# Patient Record
Sex: Female | Born: 1990
Health system: Southern US, Community
[De-identification: ages and names within clinical notes are randomized; demographics above are authoritative.]

## PROBLEM LIST (undated history)

## (undated) DIAGNOSIS — R269 Unspecified abnormalities of gait and mobility: Secondary | ICD-10-CM

## (undated) DIAGNOSIS — F419 Anxiety disorder, unspecified: Secondary | ICD-10-CM

## (undated) DIAGNOSIS — O0990 Supervision of high risk pregnancy, unspecified, unspecified trimester: Secondary | ICD-10-CM

## (undated) DIAGNOSIS — E7800 Pure hypercholesterolemia, unspecified: Secondary | ICD-10-CM

## (undated) DIAGNOSIS — F329 Major depressive disorder, single episode, unspecified: Secondary | ICD-10-CM

## (undated) DIAGNOSIS — R740 Nonspecific elevation of levels of transaminase and lactic acid dehydrogenase [LDH]: Secondary | ICD-10-CM

## (undated) DIAGNOSIS — G43019 Migraine without aura, intractable, without status migrainosus: Secondary | ICD-10-CM

## (undated) DIAGNOSIS — E559 Vitamin D deficiency, unspecified: Secondary | ICD-10-CM

## (undated) DIAGNOSIS — O403XX0 Polyhydramnios, third trimester, not applicable or unspecified: Secondary | ICD-10-CM

## (undated) DIAGNOSIS — O09293 Supervision of pregnancy with other poor reproductive or obstetric history, third trimester: Secondary | ICD-10-CM

## (undated) DIAGNOSIS — O24419 Gestational diabetes mellitus in pregnancy, unspecified control: Secondary | ICD-10-CM

## (undated) DIAGNOSIS — Z8759 Personal history of other complications of pregnancy, childbirth and the puerperium: Secondary | ICD-10-CM

## (undated) DIAGNOSIS — O1490 Unspecified pre-eclampsia, unspecified trimester: Secondary | ICD-10-CM

## (undated) DIAGNOSIS — R7401 Elevation of levels of liver transaminase levels: Secondary | ICD-10-CM

## (undated) DIAGNOSIS — F319 Bipolar disorder, unspecified: Secondary | ICD-10-CM

## (undated) DIAGNOSIS — F32A Depression, unspecified: Secondary | ICD-10-CM

## (undated) HISTORY — DX: Unspecified abnormalities of gait and mobility: R26.9

## (undated) HISTORY — DX: Polyhydramnios, third trimester, not applicable or unspecified: O40.3XX0

## (undated) HISTORY — DX: Supervision of high risk pregnancy, unspecified, unspecified trimester: O09.90

## (undated) HISTORY — DX: Nonspecific elevation of levels of transaminase and lactic acid dehydrogenase (ldh): R74.0

## (undated) HISTORY — PX: TUBAL LIGATION: SHX77

## (undated) HISTORY — DX: Depression, unspecified: F32.A

## (undated) HISTORY — DX: Personal history of other complications of pregnancy, childbirth and the puerperium: Z87.59

## (undated) HISTORY — DX: Migraine without aura, intractable, without status migrainosus: G43.019

## (undated) HISTORY — DX: Vitamin D deficiency, unspecified: E55.9

## (undated) HISTORY — DX: Anxiety disorder, unspecified: F41.9

## (undated) HISTORY — DX: Supervision of pregnancy with other poor reproductive or obstetric history, third trimester: O09.293

## (undated) HISTORY — DX: Elevation of levels of liver transaminase levels: R74.01

## (undated) HISTORY — DX: Bipolar disorder, unspecified: F31.9

---
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
## 2008-02-24 ENCOUNTER — Ambulatory Visit: Payer: Self-pay | Admitting: Family Medicine

## 2008-04-30 ENCOUNTER — Observation Stay: Payer: Self-pay

## 2008-06-24 ENCOUNTER — Inpatient Hospital Stay: Payer: Self-pay

## 2009-06-06 ENCOUNTER — Ambulatory Visit: Payer: Self-pay | Admitting: Family Medicine

## 2009-09-07 ENCOUNTER — Encounter: Payer: Self-pay | Admitting: Maternal & Fetal Medicine

## 2009-09-12 ENCOUNTER — Encounter: Payer: Self-pay | Admitting: Pediatric Cardiology

## 2009-10-08 ENCOUNTER — Observation Stay: Payer: Self-pay | Admitting: Obstetrics & Gynecology

## 2009-10-12 ENCOUNTER — Encounter: Payer: Self-pay | Admitting: Obstetrics & Gynecology

## 2009-11-07 ENCOUNTER — Observation Stay: Payer: Self-pay

## 2009-11-08 ENCOUNTER — Ambulatory Visit: Payer: Self-pay

## 2009-11-09 ENCOUNTER — Encounter: Payer: Self-pay | Admitting: Obstetrics and Gynecology

## 2009-11-14 ENCOUNTER — Observation Stay: Payer: Self-pay | Admitting: Obstetrics and Gynecology

## 2009-11-28 ENCOUNTER — Ambulatory Visit: Payer: Self-pay | Admitting: Obstetrics & Gynecology

## 2009-12-01 ENCOUNTER — Encounter: Payer: Self-pay | Admitting: Maternal and Fetal Medicine

## 2009-12-04 ENCOUNTER — Encounter: Payer: Self-pay | Admitting: Maternal and Fetal Medicine

## 2009-12-11 ENCOUNTER — Encounter: Payer: Self-pay | Admitting: Maternal and Fetal Medicine

## 2009-12-14 ENCOUNTER — Encounter: Payer: Self-pay | Admitting: Maternal & Fetal Medicine

## 2009-12-18 ENCOUNTER — Encounter: Payer: Self-pay | Admitting: Obstetrics and Gynecology

## 2009-12-20 ENCOUNTER — Observation Stay: Payer: Self-pay | Admitting: Obstetrics and Gynecology

## 2009-12-25 ENCOUNTER — Inpatient Hospital Stay: Payer: Self-pay | Admitting: Advanced Practice Midwife

## 2009-12-25 ENCOUNTER — Encounter: Payer: Self-pay | Admitting: Maternal & Fetal Medicine

## 2011-04-03 ENCOUNTER — Emergency Department: Payer: Self-pay | Admitting: Emergency Medicine

## 2011-04-03 LAB — URINALYSIS, COMPLETE
Bilirubin,UR: NEGATIVE
Glucose,UR: NEGATIVE mg/dL (ref 0–75)
Ketone: NEGATIVE
Nitrite: NEGATIVE
Protein: NEGATIVE
RBC,UR: 1 /HPF (ref 0–5)
Specific Gravity: 1.019 (ref 1.003–1.030)
WBC UR: 3 /HPF (ref 0–5)

## 2011-04-03 LAB — CBC
HCT: 44.8 % (ref 35.0–47.0)
HGB: 15.2 g/dL (ref 12.0–16.0)
MCH: 32 pg (ref 26.0–34.0)
MCHC: 34 g/dL (ref 32.0–36.0)
MCV: 94 fL (ref 80–100)
RDW: 13.1 % (ref 11.5–14.5)
WBC: 9.7 10*3/uL (ref 3.6–11.0)

## 2011-04-03 LAB — BASIC METABOLIC PANEL
Anion Gap: 4 — ABNORMAL LOW (ref 7–16)
BUN: 9 mg/dL (ref 7–18)
Chloride: 108 mmol/L — ABNORMAL HIGH (ref 98–107)
Co2: 27 mmol/L (ref 21–32)
Creatinine: 0.82 mg/dL (ref 0.60–1.30)
EGFR (African American): >60
EGFR (Non-African Amer.): >60
Potassium: 3.8 mmol/L (ref 3.5–5.1)
Sodium: 139 mmol/L (ref 136–145)

## 2011-04-03 LAB — TROPONIN I: Troponin-I: <0.02 ng/mL

## 2011-07-21 ENCOUNTER — Emergency Department: Payer: Self-pay | Admitting: Emergency Medicine

## 2011-11-24 ENCOUNTER — Emergency Department: Payer: Self-pay | Admitting: Emergency Medicine

## 2012-01-15 HISTORY — PX: CLAVICLE SURGERY: SHX598

## 2012-03-10 DIAGNOSIS — S42009A Fracture of unspecified part of unspecified clavicle, initial encounter for closed fracture: Secondary | ICD-10-CM | POA: Insufficient documentation

## 2012-09-06 ENCOUNTER — Emergency Department: Payer: Self-pay | Admitting: Emergency Medicine

## 2012-09-14 ENCOUNTER — Ambulatory Visit: Payer: Self-pay

## 2012-09-15 ENCOUNTER — Emergency Department: Payer: Self-pay | Admitting: Emergency Medicine

## 2013-07-10 ENCOUNTER — Emergency Department: Payer: Self-pay | Admitting: Emergency Medicine

## 2013-07-10 LAB — COMPREHENSIVE METABOLIC PANEL
ANION GAP: 6 — AB (ref 7–16)
Albumin: 4.1 g/dL (ref 3.4–5.0)
Alkaline Phosphatase: 87 U/L
BUN: 5 mg/dL — ABNORMAL LOW (ref 7–18)
Bilirubin,Total: 0.4 mg/dL (ref 0.2–1.0)
CALCIUM: 8.6 mg/dL (ref 8.5–10.1)
Chloride: 107 mmol/L (ref 98–107)
Co2: 25 mmol/L (ref 21–32)
Creatinine: 0.8 mg/dL (ref 0.60–1.30)
EGFR (African American): >60
EGFR (Non-African Amer.): >60
GLUCOSE: 99 mg/dL (ref 65–99)
Osmolality: 273 (ref 275–301)
POTASSIUM: 4 mmol/L (ref 3.5–5.1)
SGOT(AST): 21 U/L (ref 15–37)
SGPT (ALT): 40 U/L (ref 12–78)
Sodium: 138 mmol/L (ref 136–145)
TOTAL PROTEIN: 7.9 g/dL (ref 6.4–8.2)

## 2013-07-10 LAB — URINALYSIS, COMPLETE
Bilirubin,UR: NEGATIVE
Glucose,UR: NEGATIVE mg/dL (ref 0–75)
KETONE: NEGATIVE
Nitrite: NEGATIVE
PROTEIN: NEGATIVE
Ph: 6 (ref 4.5–8.0)
RBC,UR: 1 /HPF (ref 0–5)
Specific Gravity: 1.005 (ref 1.003–1.030)

## 2013-07-10 LAB — CBC WITH DIFFERENTIAL/PLATELET
BASOS ABS: 0.1 10*3/uL (ref 0.0–0.1)
Basophil %: 0.8 %
Eosinophil #: 0.1 10*3/uL (ref 0.0–0.7)
Eosinophil %: 1.1 %
HCT: 44.3 % (ref 35.0–47.0)
HGB: 14.6 g/dL (ref 12.0–16.0)
LYMPHS PCT: 27.2 %
Lymphocyte #: 2.9 10*3/uL (ref 1.0–3.6)
MCH: 31.4 pg (ref 26.0–34.0)
MCHC: 32.8 g/dL (ref 32.0–36.0)
MCV: 96 fL (ref 80–100)
Monocyte #: 0.7 x10 3/mm (ref 0.2–0.9)
Monocyte %: 7.1 %
NEUTROS PCT: 63.8 %
Neutrophil #: 6.8 10*3/uL — ABNORMAL HIGH (ref 1.4–6.5)
PLATELETS: 178 10*3/uL (ref 150–440)
RBC: 4.64 10*6/uL (ref 3.80–5.20)
RDW: 13.2 % (ref 11.5–14.5)
WBC: 10.6 10*3/uL (ref 3.6–11.0)

## 2013-07-10 LAB — HCG, QUANTITATIVE, PREGNANCY: Beta Hcg, Quant.: 420 m[IU]/mL — ABNORMAL HIGH

## 2013-07-13 ENCOUNTER — Emergency Department: Payer: Self-pay | Admitting: Emergency Medicine

## 2013-07-13 LAB — HCG, QUANTITATIVE, PREGNANCY: Beta Hcg, Quant.: 1184 m[IU]/mL — ABNORMAL HIGH

## 2013-09-06 ENCOUNTER — Encounter: Payer: Self-pay | Admitting: Obstetrics and Gynecology

## 2013-09-14 ENCOUNTER — Encounter: Payer: Self-pay | Admitting: Obstetrics and Gynecology

## 2013-10-11 ENCOUNTER — Encounter: Payer: Self-pay | Admitting: Obstetrics and Gynecology

## 2013-10-12 ENCOUNTER — Ambulatory Visit: Payer: Self-pay | Admitting: Obstetrics and Gynecology

## 2013-10-14 ENCOUNTER — Ambulatory Visit: Payer: Self-pay | Admitting: Obstetrics and Gynecology

## 2013-11-18 ENCOUNTER — Encounter: Payer: Self-pay | Admitting: Obstetrics and Gynecology

## 2013-11-23 ENCOUNTER — Encounter: Payer: Self-pay | Admitting: Pediatric Cardiology

## 2013-12-16 ENCOUNTER — Encounter: Payer: Self-pay | Admitting: Obstetrics and Gynecology

## 2013-12-16 LAB — HEMOGLOBIN A1C: HEMOGLOBIN A1C: 7.1 % — AB (ref 4.2–6.3)

## 2013-12-22 ENCOUNTER — Observation Stay: Payer: Self-pay | Admitting: Obstetrics and Gynecology

## 2013-12-22 LAB — URINALYSIS, COMPLETE
Bilirubin,UR: NEGATIVE
Blood: NEGATIVE
Glucose,UR: >=500 mg/dL (ref 0–75)
KETONE: NEGATIVE
Nitrite: NEGATIVE
PH: 6 (ref 4.5–8.0)
PROTEIN: NEGATIVE
SPECIFIC GRAVITY: 1.015 (ref 1.003–1.030)
Squamous Epithelial: 3
WBC UR: 2 /HPF (ref 0–5)

## 2013-12-23 ENCOUNTER — Encounter: Payer: Self-pay | Admitting: Maternal & Fetal Medicine

## 2013-12-28 ENCOUNTER — Ambulatory Visit: Payer: Self-pay | Admitting: Obstetrics and Gynecology

## 2013-12-30 ENCOUNTER — Encounter: Payer: Self-pay | Admitting: Maternal & Fetal Medicine

## 2014-01-03 ENCOUNTER — Encounter: Payer: Self-pay | Admitting: Obstetrics and Gynecology

## 2014-01-05 ENCOUNTER — Observation Stay: Payer: Self-pay | Admitting: Obstetrics & Gynecology

## 2014-01-10 ENCOUNTER — Encounter: Payer: Self-pay | Admitting: Obstetrics and Gynecology

## 2014-01-24 DIAGNOSIS — Z3009 Encounter for other general counseling and advice on contraception: Secondary | ICD-10-CM | POA: Insufficient documentation

## 2014-01-24 DIAGNOSIS — O24419 Gestational diabetes mellitus in pregnancy, unspecified control: Secondary | ICD-10-CM | POA: Insufficient documentation

## 2014-01-24 DIAGNOSIS — O0990 Supervision of high risk pregnancy, unspecified, unspecified trimester: Secondary | ICD-10-CM

## 2014-01-24 DIAGNOSIS — O09293 Supervision of pregnancy with other poor reproductive or obstetric history, third trimester: Secondary | ICD-10-CM

## 2014-01-24 HISTORY — DX: Supervision of high risk pregnancy, unspecified, unspecified trimester: O09.90

## 2014-01-24 HISTORY — DX: Supervision of pregnancy with other poor reproductive or obstetric history, third trimester: O09.293

## 2014-02-11 DIAGNOSIS — O403XX0 Polyhydramnios, third trimester, not applicable or unspecified: Secondary | ICD-10-CM

## 2014-02-11 HISTORY — DX: Polyhydramnios, third trimester, not applicable or unspecified: O40.3XX0

## 2014-02-13 ENCOUNTER — Inpatient Hospital Stay: Payer: Self-pay | Admitting: Obstetrics and Gynecology

## 2014-02-13 LAB — CBC WITH DIFFERENTIAL/PLATELET
Basophil #: 0.1 10*3/uL (ref 0.0–0.1)
Basophil %: 0.5 %
EOS ABS: 0 10*3/uL (ref 0.0–0.7)
Eosinophil %: 0.4 %
HCT: 43.5 % (ref 35.0–47.0)
HGB: 14.1 g/dL (ref 12.0–16.0)
LYMPHS PCT: 14.2 %
Lymphocyte #: 1.7 10*3/uL (ref 1.0–3.6)
MCH: 29.9 pg (ref 26.0–34.0)
MCHC: 32.4 g/dL (ref 32.0–36.0)
MCV: 92 fL (ref 80–100)
MONOS PCT: 5 %
Monocyte #: 0.6 x10 3/mm (ref 0.2–0.9)
NEUTROS PCT: 79.9 %
Neutrophil #: 9.5 10*3/uL — ABNORMAL HIGH (ref 1.4–6.5)
PLATELETS: 98 10*3/uL — AB (ref 150–440)
RBC: 4.72 10*6/uL (ref 3.80–5.20)
RDW: 14.4 % (ref 11.5–14.5)
WBC: 11.9 10*3/uL — AB (ref 3.6–11.0)

## 2014-02-14 ENCOUNTER — Ambulatory Visit: Payer: Self-pay | Admitting: Obstetrics and Gynecology

## 2014-02-14 LAB — CBC WITH DIFFERENTIAL/PLATELET
BASOS ABS: 0 10*3/uL (ref 0.0–0.1)
Basophil %: 0.3 %
EOS PCT: 0.2 %
Eosinophil #: 0 10*3/uL (ref 0.0–0.7)
HCT: 40.4 % (ref 35.0–47.0)
HGB: 13.2 g/dL (ref 12.0–16.0)
Lymphocyte #: 3.6 10*3/uL (ref 1.0–3.6)
Lymphocyte %: 24.9 %
MCH: 30.2 pg (ref 26.0–34.0)
MCHC: 32.6 g/dL (ref 32.0–36.0)
MCV: 93 fL (ref 80–100)
MONOS PCT: 6 %
Monocyte #: 0.9 x10 3/mm (ref 0.2–0.9)
NEUTROS ABS: 9.9 10*3/uL — AB (ref 1.4–6.5)
Neutrophil %: 68.6 %
Platelet: 82 10*3/uL — ABNORMAL LOW (ref 150–440)
RBC: 4.37 10*6/uL (ref 3.80–5.20)
RDW: 14.3 % (ref 11.5–14.5)
WBC: 14.5 10*3/uL — AB (ref 3.6–11.0)

## 2014-04-07 ENCOUNTER — Ambulatory Visit: Payer: Self-pay | Admitting: Obstetrics and Gynecology

## 2014-05-07 NOTE — Consult Note (Signed)
Referral Information:  Reason for Referral pregnancy complicated by gestational diabetes, h/o preeclampsia and low Papp A   Referring Physician [facility]   Prenatal Hx 24 yo G3 p1102  LMP 06/13/2013 and EDC of 03/19/2013 now 22 5/7 weeks  married white female here for consult to discuss her gestational diabetes.  Pt had gestational diabetes in her last pregnancy .Referred to [facility] from [facility] for gestational diabetes care. Her HGBA1c was 5.6% on 9/23. Pt had elevated Blood sugars and was started on glyburide 2.5 bid which was increased to 5 mg bid when her blood sugars remained high . Referred today to discuss control of blood sugars . Pt was referred to [facility] seen 10/12/13. -She has a history of preeclampsia in her first gestation requiring her to be induced at 36 weeks.She is on aspirin.   At her first trimester screen, PAPP-A value was low, at the 2nd percentile.  Low levels of PAPP-A have been shown to be associated with an increased chance for low birth weight, preeclampsia, preterm birth and fetal loss. We recommended a follow up ultrasound for fetal growth in the third trimester. h/o bilateral club feet in her younger son- now after surgeries at [facility] he is an active little boy with no problems.  Obesity - BMI 33 Planning BTL per chart   Past Obstetrical Hx para1- 2010- 36 weeks , female 5lbs , induced for preeclampsia vaginal birht  para 2 2011 37 weeks gest DM , no preex female, club feet  para 3 current   Home Medications:  Medication Instructions Status  Prenatal Multivitamins Prenatal Multivitamins oral tablet 1 tab(s) orally once a day Active  Tylenol 500 mg oral tablet 2 tab(s) orally every 6 hours, As Needed - for Pain Active  glyBURIDE 5 mg oral tablet 1 tab(s) orally 2 times a day Active   Allergies:   No Known Allergies:   Vital Signs/Notes:  Nursing Vital Signs: **Vital Signs.:   05-Nov-15 10:08  Vital Signs Type Routine  Temperature Temperature (F) 97.9   Celsius 36.6  Temperature Source oral  Pulse Pulse 98  Respirations Respirations 20  Systolic BP Systolic BP 517  Diastolic BP (mmHg) Diastolic BP (mmHg) 65  Mean BP 81  Pulse Ox % Pulse Ox % 97  Pulse Ox Activity Level  At rest  Oxygen Delivery Room Air/ 21 %   Perinatal Consult:  LMP 13-Jun-2013   Past Medical History cont'd obesity   PSurg Hx clavicle fracture   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 - [employer]   Soc Hx married   Review Of Systems:  Subjective 66 inches tall   Medications/Allergies Reviewed Medications/Allergies reviewed     Additional Lab/Radiology Notes FBS 100 per pt did not bring book - reports most in 120 range  in clinic 129 - drank Pepsi   Impression/Recommendations:  Impression IUP at 31 5/7 weeks  1- Gest diabetes - near normal hgb A1c in september - most BS in 120 range per pt on glyburide 5mg  bid - has sen [facility] 2 h/o preeclampsia - required induction at 36 weeks normal baseline p/c ratio , bp normal today on aspirin 3 low PAPP-A - associated with poor fetal growth and increased risk of preeclampsia - planning u/s for growth and already on aspirin 4 h/o club feet in prior child - normal on u/s at [facility] 5 obesity- has had diet counseling  6 planning BTL per chart   Recommendations 1- reviewed diabetes care - I requested father remove regular "pepsi"  from house and she eat an ADA  breakfast as advised by [facility]  RTC 2 weeks bring book ! 2 continue aspirin 3 serial scans for growth in third trimester q 4 weeks for diabetes and low papp a 4 Limit weight gain  5 [facility] to discuss BTL   Plan:  Genetic Counseling done before   Ultrasound at what gestational ages Monthly >24 weeks   Antepartum Testing Twice weekly, Starting at 32 weeks   Delivery Mode Vaginal   Additional Testing Thyroid panel, HbA1C, in third trimester   Delivery at what gestational age [redacted] weeks    Total Time Spent with Patient 30 minutes   >50% of  visit spent in couseling/coordination of care yes   Office Use Only 99242  Level 2 (43min) NEW office consult exp prob focused   Coding Description: MATERNAL CONDITIONS/HISTORY INDICATION(S).   Diabetes - Gestational GDM.  Electronic Signatures: Sharyn Creamer (MD)  (Signed [phone] 15:44)  Authored: Referral, Home Medications, Allergies, Vital Signs/Notes, Consult, Exam, Lab/Radiology Notes, Impression, Plan, Billing, Coding Description   Last Updated: 05-Nov-15 15:44 by Sharyn Creamer (MD)

## 2014-05-07 NOTE — Consult Note (Signed)
Referral Information:  Reason for Referral 24 yo gravida 3 para 34 at [redacted]w[redacted]d here for follow-up of her blood sugars; Pregnancy complicated by obesity (BMI = 33), gestational diabetes and low Papp-A.  History of preeclampsia in a prior pregnancy.  She was started on insulin last week.   Referring Physician [facility]   Prenatal Hx LMP 06/13/2013 and EDC of 03/19/2013 now 28 5/7 weeks. Pt has had elevated blood sugars this pregnancy and was started on glyburide 2.5mg  BID which was increased to 5 mg BID when her blood sugars remained high. On 11/30 she had her glyburide dose changed to 7.5 mg BID.  Last week she brought  her log with only 3 days of blood sugars for review.  Pt was referred to [facility] - seen 10/12/13, but she did not keep her follow-up appointments.  Her HGBA1c was 5.6% on 9/23. 2 weeks ago it was 7.1. She was started on Lantus/Novolog last week by Dr. Jeneen Rinks.  She had a normal screening fetal echocardiogram with pediatric cardiology 11/10.  She also has a history of preeclampsia in her first gestation requiring her to be induced at 36 weeks.  She is on aspirin.  At her first trimester screen, PAPP-A value was low, at the 2nd percentile.    Her younger son had bilateral club feet - now after surgeries at [facility] he is an active little boy with no problems.   Past Obstetrical Hx para 1- 2010 36 weeks, induced for preeclampsia, 5# female, NVD  para 2- 2011 37 weeks, gestational DM, no preeclampsia, club feet, 8#4oz female, NVD  para 3- current   Home Medications: Medication Instructions Status  Prenatal Multivitamins Prenatal Multivitamins oral tablet 1 tab(s) orally once a day Active  Tylenol 500 mg oral tablet 2 tab(s) orally every 6 hours, As Needed - for Pain Active  Lantus 100 units/mL subcutaneous solution 24 unit(s) subcutaneous once a day (at bedtime) Active  NovoLOG 100 units/mL subcutaneous solution 8 unit(s) subcutaneous 3 times a day Active   Allergies:   No Known  Allergies:   Vital Signs/Notes:  Nursing Vital Signs: **Vital Signs.:   17-Dec-15 08:20  Vital Signs Type Routine  Temperature Temperature (F) 97.9  Celsius 36.6  Temperature Source oral  Pulse Pulse 102  Respirations Respirations 18  Systolic BP Systolic BP 193  Diastolic BP (mmHg) Diastolic BP (mmHg) 75  Mean BP 89  Pulse Ox % Pulse Ox % 97  Pulse Ox Activity Level  At rest  Oxygen Delivery Room Air/ 21 %   Perinatal Consult:  LMP 13-Jun-2013   Review Of Systems:  Fever/Chills No   Cough No   Abdominal Pain No   Diarrhea No   Constipation No   Nausea/Vomiting No   SOB/DOE No   Chest Pain No   Dysuria No   Tolerating Diet Yes   Medications/Allergies Reviewed Medications/Allergies reviewed   Routine Chem:  03-Dec-15 09:49   Hemoglobin A1c ([facility])  7.1 (The American Diabetes Association recommends that a primary goal of therapy should be <7% and that physicians should reevaluate the treatment regimen in patients with HbA1c values consistently >8%.)    Additional Lab/Radiology Notes FBS 105-179   7/7 > 95 2hr post breakfast 85/188  3/6 > 120 2hr post lunch 94-115  0/6 > 120 2hr post dinner 120-154  5/6 > 120  12/17/2013 - TSH 0.915 (0.415 - 4.500) Free T4 not obtained, but FTI was 1.9 (1.2-4.9)   Impression/Recommendations:  Impression IUP at 28  5/7 weeks  1.  Gestational diabetes now on insulin with improvement in BS; although still with elevated FBS and some PPs, many fewer very elevated BS.   2.  History of preeclampsia - previously required induction at 36 weeks.  Normal baseline p/c ratio, BP stable today.  On aspirin. 3.  Low PAPP-A - associated with poor fetal growth and increased risk of preeclampsia - has f/up US on Monday. 4.  History of  club feet in prior child - normal on [place] at [facility] 5.  Obesity - has had diet counseling.  Missed a [facility] appointment on 12/15 and declines to reschedule.  Has made dietary changes and is using sugar  substitutes.  6.  Planning BTL per chart   Recommendations 1. Gestational diabetes now on insulin with significant improvement in BS.  She reports feeling "tired" but not feeling like she's going to "fall out" like she did with the glyburide.   -- Insulin was increased to 28U Lantus and Novolog to 08/21/08.  Will likely need to continue to increase dose which she is aware of. -- She will return for BS review on Monday at the time of her next [place] and f/up pending those results or any changes. -- The patient knows how to manage her hypoglycemia, but if it is persistent on her new regimen, she will contact us or [facility] when we are unavailable. -- Patient congratulated on the changes she's made in her lifestyle and ability to bring down her highest BS. 2.  History of preeclampsia --Continue aspirin and review signs/symptoms of preeclampsia. 3.   Low PAPP-A -- Serial scans for growth in third trimester q 4 weeks for diabetes and low PAPP-A - next is 12/21. 4.  History of club feet in prior child -- no other recommendations since [place] at [facility] was normal 5.  Obesity -- She has been counseled that she is less likely to have persistent DM PP if she achieves an ideal body weight PP 6. Planning BTL   Plan:  Ultrasound at what gestational ages Monthly > 28 weeks   Antepartum Testing Twice weekly, Starting at 32 weeks   Delivery Mode Vaginal, Depending on presentation and EFW   Delivery at what gestational age [redacted] weeks, or 70 weeks if BS poorly controlled or evidence of fetopathy    Total Time Spent with Patient 15 minutes   >50% of visit spent in couseling/coordination of care yes   Office Use Only 99213  Office Visit Level 3 (24min) EST exp prob focused outpt   Coding Description: MATERNAL CONDITIONS/HISTORY INDICATION(S).   Diabetes - Gestational GDM.   Obesity - BMI greater than equal to 30.  Electronic Signatures: Wynona Neat (MD)  (Signed 17-Dec-15 08:43)  Authored:  Referral, Home Medications, Allergies, Vital Signs/Notes, Consult, Exam, Lab, Lab/Radiology Notes, Impression, Plan, Billing, Coding Description   Last Updated: 17-Dec-15 08:43 by Wynona Neat (MD)

## 2014-05-07 NOTE — Consult Note (Signed)
Referral Information:  Reason for Referral Follow up blood sugars; pregnancy complicated by gestational diabetes, h/o preeclampsia and low Papp A.   Referring Physician [facility]   Prenatal Hx 24 yo G3P1102  LMP 06/13/2013 and EDC of 03/19/2013 now 26 5/7 weeks.  Carrie Burke did not present for scheduled MFM follow up on 11/19 but recently (on 11/30) had her glyburide dose changed to 7.5 mg BID.  She brings her log with 3 days of blood sugars for review but has no log prior to Monday.  Her HGBA1c was 5.6% on 9/23. Pt had elevated blood sugars and was started on glyburide 2.5mg  BID which was increased to 5 mg BID when her blood sugars remained high - recently was increased again. Pt was referred to [facility] - seen 10/12/13. Had a normal screening fetal echocardiogram with pediatric cardiology 11/10.  She also has a history of preeclampsia in her first gestation requiring her to be induced at 36 weeks.  She is on aspirin.   At her first trimester screen, PAPP-A value was low, at the 2nd percentile.  Low levels of PAPP-A have been shown to be associated with an increased chance for low birth weight, preeclampsia, preterm birth and fetal loss. We recommended a follow up ultrasound for fetal growth in the third trimester. h/o bilateral club feet in her younger son- now after surgeries at [facility] he is an active little boy with no problems.  Obesity - BMI 33 Planning BTL per chart   Past Obstetrical Hx para 1- 2010 36 weeks, induced for preeclampsia, 5# female, NVD  para 2- 2011 37 weeks, gestational DM, no preeclampsia, club feet, 8#4oz female, NVD  para 3- current   Home Medications: Medication Instructions Status  Prenatal Multivitamins Prenatal Multivitamins oral tablet 1 tab(s) orally once a day Active  Tylenol 500 mg oral tablet 2 tab(s) orally every 6 hours, As Needed - for Pain Active  glyBURIDE 7.5 milligram(s) orally 2 times a day Active   Allergies:   No Known Allergies:   Vital  Signs/Notes:  Nursing Vital Signs: **Vital Signs.:   03-Dec-15 09:14  Vital Signs Type Routine  Temperature Temperature (F) 97.8  Celsius 36.5  Temperature Source oral  Pulse Pulse 18  Respirations Respirations 18  Systolic BP Systolic BP 160  Diastolic BP (mmHg) Diastolic BP (mmHg) 67  Mean BP 91  Pulse Ox % Pulse Ox % 99  Pulse Ox Activity Level  At rest  Oxygen Delivery Room Air/ 21 %   Perinatal Consult:  LMP 13-Jun-2013    Additional Lab/Radiology Notes FBS 100-186 2hr post breakfast 145-237 2hr post lunch 86-136 2hr post dinner 145-179   Impression/Recommendations:  Impression IUP at 26 5/7 weeks  1.  Gestational diabetes - near normal hgb A1c in September - Was just increased to 7.5 mg BID on Monday and has only 3 days worth of BS to review.   2.  H/o preeclampsia - required induction at 36 weeks.  Normal baseline p/c ratio, BP stable today.  On aspirin. 3.  Low PAPP-A - associated with poor fetal growth and increased risk of preeclampsia - has f/up ultrasound for growth scheduled 12/21. 4.  H/o club feet in prior child - normal on [place] at [facility] 5.  Obesity - has had diet counseling  6.  Planning BTL per chart   Recommendations 1.  Reviewed diabetes care - She has started drinking diet pepsi but still uses regular sugar in her coffee.  Encouraged using sugar  substitutes especially in the am when her BS seem to be the highest.  She complained of feeling like she was going to "fall out" after her glyburide was increased.  She took her BS and it was 37 and she felt like it was too low so she ate and had some candy which bumped it to the low 200s.  We discussed that she may not be used to living at "normal" BS so was encouraged to choose foods with a low glycemic index that would help her to feel full without dropping her BS and to have snacks packed with her that were appropriate.  I also encouraged her to consider walking in the am before work. Lastly - need more BS before  making further recommendations although we talked about the likelihood that she will require insulin - considered starting today but patient very resistent since she didn't feel good since starting the higher dose of glyburide. 2.  Continue aspirin 3.  Serial scans for growth in third trimester q 4 weeks for diabetes and low PAPP-A - next is 12/21. 4.  [facility] to discuss BTL 5.  RTC 1 wk with BS - after higher dose of glyburide, using sugar substitute and being more careful with diet. 6.  HgbA1C and TFTs drawn today - will follow up.   Plan:  Ultrasound at what gestational ages Monthly > 28 weeks   Antepartum Testing Twice weekly, Starting at 32 weeks   Delivery Mode Vaginal   Additional Testing Thyroid panel, HbA1C, Drawn today   Delivery at what gestational age [redacted] weeks, or 37 weeks if BS poorly controlled or evidence of fetopathy    Total Time Spent with Patient 25 mins.   >50% of visit spent in couseling/coordination of care yes   Office Use Only 99214  Office Visit Level 4 (70min) EST detailed office/outpt   Coding Description: MATERNAL CONDITIONS/HISTORY INDICATION(S).   Diabetes - Gestational GDM.  Electronic Signatures: Wynona Neat (MD)  (Signed 03-Dec-15 09:51)  Authored: Referral, Home Medications, Allergies, Vital Signs/Notes, Consult, Lab/Radiology Notes, Impression, Plan, Billing, Coding Description   Last Updated: 03-Dec-15 09:51 by Wynona Neat (MD)

## 2014-05-07 NOTE — Consult Note (Signed)
Referral Information:  Reason for Referral 24 yo gravida 3 para 1102  at 36 2/7 here for follow-up of her blood sugars due to gestational diabetes on insulin   Referring Physician [facility]   Prenatal Hx LMP 06/13/2013 and EDC of 03/19/2013 now 30 2/7 weeks. Pregnancy complicated by obesity (BMI = 33), gestational diabetes and low Papp-A.  History of preeclampsia in her first pregnancy.   She was started on insulin December 10 after increasing doses of glyburide failed to contorl her blood sugar .  Her current dose is Lantus 30 units qhs and Novolog 08/22/10.  .  Pt was referred to [facility] - seen 10/12/13, but she did not keep her follow-up appointments.  Her HGBA1c was 5.6% on 9/23. 2 weeks ago it was 7.1. She works nights but has adjusted her log to reflect her sleepign and eating pattern.   She had a normal screening fetal echocardiogram with pediatric cardiology 11/10.  She also has a history of preeclampsia in her first gestation requiring her to be induced at 36 weeks.  She is not on aspirin when I queried her today.  At her first trimester screen, PAPP-A value was low, at the 2nd percentile.    Her younger son had bilateral club feet - now after surgeries at [facility] he is an active little boy with no problems. She is certain she desires a BTL - we reviewed [facility] option but she absolutely does not want more children in the future.  pt states that [facility] mentioned they plan for her to have a [facility] delviery but have not placed the referral -I told her we can continue to offer soem of her care here at [facility]  .   Past Obstetrical Hx para 1- 2010 36 weeks, induced for preeclampsia, 5# female, NVD  para 2- 2011 37 weeks, gestational DM, no preeclampsia, club feet, 8#4oz female, NVD  para 3- current   Home Medications:  Medication Instructions Status  Prenatal Multivitamins Prenatal Multivitamins oral tablet 1 tab(s) orally once a day Active  Tylenol 500 mg oral tablet 2 tab(s) orally every 6  hours, As Needed - for Pain Active  NovoLOG 100 units/mL subcutaneous solution 8 unit(s) subcutaneous 2 times a day Breakfast and Lunch Active  Lantus 100 units/mL subcutaneous solution 28 unit(s) subcutaneous once a day (at bedtime) Active  NovoLOG 100 units/mL subcutaneous solution 12 unit(s) subcutaneous once a day Active   Allergies:   No Known Allergies:   Vital Signs/Notes:  Nursing Vital Signs:  **Vital Signs.:   28-Dec-15 10:24  Vital Signs Type Routine  Temperature Temperature (F) 98.7  Celsius 37  Temperature Source oral  Pulse Pulse 77  Respirations Respirations 18  Systolic BP Systolic BP 287  Diastolic BP (mmHg) Diastolic BP (mmHg) 67  Mean BP 85  Pulse Ox % Pulse Ox % 97  Pulse Ox Activity Level  At rest  Oxygen Delivery Room Air/ 21 %   Perinatal Consult:  LMP 13-Jun-2013   Review Of Systems:  Fever/Chills No   Cough No   Abdominal Pain No   Diarrhea No   Constipation No   Nausea/Vomiting No   SOB/DOE No   Chest Pain No   Dysuria No   Tolerating Diet Yes   Medications/Allergies Reviewed Medications/Allergies reviewed    Additional Lab/Radiology Notes FBS 90-110   5/6 > 90 2hr post breakfast 106-126  1/5 > 120 2hr post lunch  110-130  2/5  > 120 2hr post dinner 103-126  1/5 > 120 Much improved !  RBS prior to eating 136 in clinic 12/17/2013 - TSH 0.915 (0.415 - 4.500) Free T4 not obtained, but FTI was 1.9 (1.2-4.9)   Impression/Recommendations:  Impression 24 yo gravida 3 para 1102 at 44 2/7  with: 1.  Gestational diabetes - blood sugars near target on insulin!   Given elevated RBS in clinic will increse lantus to 32 , continue 08/22/10 novolog  2.  History of preeclampsia - previously required induction at 36 weeks.  Normal baseline p/c ratio, BP stable today.  Not on aspirin per pt report today 3.  Low PAPP-A - associated with poor fetal growth and increased risk of preeclampsia - continue growth scans  4.  History of  club feet in  prior child - normal on [place] at [facility] 5.  Obesity - has had diet counseling.    6.  Planning BTL - certain   Recommendations 1.  Gestational diabetes - blood sugars  improved on insulin.  -- Insulin was increased to 32U Lantus and Novolog to 08/22/10.  Will likely need to continue to increase dose which she is aware of. -- The patient knows how to manage her hypoglycemia, but if it is persistent on her new regimen, she will contact us or [facility] when we are unavailable.She has made dietary changes and is using sugar substitutes. -- Patient understands may be transferred to [facility] for continued care if desired by [facility] she will discuss at next appointment. 2.  History of preeclampsia review for signs/symptoms of preeclampsia. 3.   Low PAPP-A -- Serial scans for growth in third trimester q 4 weeks for diabetes and low PAPP-A -  4.  History of club feet in prior child -- no other recommendations since [place] at [facility] was normal 5.  Obesity -- She has been counseled that she is less likely to have persistent DM PP if she achieves an ideal body weight PP 6. Planning BTL   Plan:  Ultrasound at what gestational ages Monthly > 28 weeks   Antepartum Testing Twice weekly, Starting at 6 weeks   Delivery Mode Vaginal, Depending on presentation and EFW   Delivery at what gestational age [redacted] weeks, or 50 weeks if BS poorly controlled or evidence of fetopathy   Comment/Plan Thank you for allowing [place] to participate in her care.    Total Time Spent with Patient 15 minutes   >50% of visit spent in couseling/coordination of care yes   Office Use Only 99213  Office Visit Level 3 (90min) EST exp prob focused outpt   Coding Description: MATERNAL CONDITIONS/HISTORY INDICATION(S).   Diabetes - Gestational GDM.   Obesity - BMI greater than equal to 30.  Electronic Signatures: Sharyn Creamer (MD)  (Signed 28-Dec-15 12:50)  Authored: Referral, Home Medications, Allergies, Vital  Signs/Notes, Consult, Exam, Lab/Radiology Notes, Impression, Plan, Billing, Coding Description   Last Updated: 28-Dec-15 12:50 by Sharyn Creamer (MD)

## 2014-05-07 NOTE — Consult Note (Signed)
Referral Information:  Reason for Referral 24 yo gravida 3 para 15 at [redacted]w[redacted]d here for follow-up of her blood sugars; Pregnancy complicated by obesity (BMI = 33), gestational diabetes and low Papp-A.  History of preeclampsia in a prior pregnancy.   Referring Physician [place]   Prenatal Hx LMP 06/13/2013 and EDC of 03/19/2013 now 27 5/7 weeks. Pt has had elevated blood sugars this pregnancy and was started on glyburide 2.5mg  BID which was increased to 5 mg BID when her blood sugars remained high. On 11/30 she had her glyburide dose changed to 7.5 mg BID.  Last week she brought  her log with only 3 days of blood sugars for review.  Pt was referred [facility] - seen 10/12/13, but she did not keep her follow-up appointments.  Her HGBA1c was 5.6% on 9/23. Last week it was 7.1.   She had a normal screening fetal echocardiogram with pediatric cardiology 11/10.  She also has a history of preeclampsia in her first gestation requiring her to be induced at 36 weeks.  She is on aspirin.  At her first trimester screen, PAPP-A value was low, at the 2nd percentile.    Her younger son had bilateral club feet - now after surgeries at [facility] he is an active little boy with no problems.   Past Obstetrical Hx para 1- 2010 36 weeks, induced for preeclampsia, 5# female, NVD  para 2- 2011 37 weeks, gestational DM, no preeclampsia, club feet, 8#4oz female, NVD  para 3- current   Home Medications: Medication Instructions Status  Prenatal Multivitamins Prenatal Multivitamins oral tablet 1 tab(s) orally once a day Active  Tylenol 500 mg oral tablet 2 tab(s) orally every 6 hours, As Needed - for Pain Active  glyBURIDE 7.5 milligram(s) orally 2 times a day Active   Allergies:   No Known Allergies:   Vital Signs/Notes:  Nursing Vital Signs: **Vital Signs.:   10-Dec-15 10:11  Vital Signs Type Routine  Temperature Temperature (F) 98.3  Celsius 36.8  Temperature Source oral  Pulse Pulse 90  Respirations  Respirations 18  Systolic BP Systolic BP 128  Diastolic BP (mmHg) Diastolic BP (mmHg) 75  Mean BP 96  Pulse Ox % Pulse Ox % 96  Pulse Ox Activity Level  At rest  Oxygen Delivery Room Air/ 21 %   Perinatal Consult:  LMP 13-Jun-2013   Review Of Systems:  Fever/Chills No   Cough No   Abdominal Pain No   Diarrhea No   Constipation No   Nausea/Vomiting No   SOB/DOE No   Chest Pain No   Dysuria No   Tolerating Diet Yes   Medications/Allergies Reviewed Medications/Allergies reviewed   Exam:  Today's Weight 97Kg;BMI=33     Routine Chem:  03-Dec-15 09:49   Hemoglobin A1c ([facility])  7.1 (The American Diabetes Association recommends that a primary goal of therapy should be <7% and that physicians should reevaluate the treatment regimen in patients with HbA1c values consistently >8%.)    Additional Lab/Radiology Notes FBS 100-186   11/11 > 95 2hr post breakfast 100-237  8/10 > 120 2hr post lunch 86-150  6/10 > 120 2hr post dinner 100-179  9/10 > 120  12/17/2013 - TSH 0.915 (0.415 - 4.500) Free T4 not obtained, but FTI was 1.9 (1.2-4.9)   Impression/Recommendations:  Impression IUP at 27 5/7 weeks  1.  Gestational diabetes with essentially no change in her BSs despite glyburide.  Rising HbA1C 2.  History of preeclampsia - previously required induction  at 36 weeks.  Normal baseline p/c ratio, BP stable today.  On aspirin. 3.  Low PAPP-A - associated with poor fetal growth and increased risk of preeclampsia   4.  History of  club feet in prior child - normal on [place] at [facility] 5.  Obesity - has had diet counseling  6.  Planning BTL per chart   Recommendations 1. Gestational diabetes with essentially no change in her BSs despite glyburide.  Rising HbA1C:   -- She continues to complain of feeling like she is going to "fall out" after her glyburide when her BS is below 100. Dr. Zack Seal advised her to walk and that brought on an episode of contractions for which she was  evaluated on L & D yesterday.  She was counseled again that she does not need to treat BSs below 70 and that after three weeks she will adjust to euglycemia -- I have prescribed insulin 0.5 units per kg (half of what I ultimately expect that she will requre):  Lantus 24 units qhs and Novolog or Humalog 8 units with meals via pen.  She did not take her glyburide today because she hasn't eaten yet.  I advised her to skip her glyburide today and start with her Lantus this evening and add the short acting insulin tomorrow. -- She should return here in one week to review her BSs -- She was referred to [facility] again and has an appt next Tuesday morning, December 28, 2013.  -- The patient is very anxious about starting insulin and we spent considerable time reviewing its safety and the danger of hyperglycemia to the fetus. -- The patient knows how to manage her hypoglycemia, but if it is persistent on her new regimen, she will contact us or [facility] when we are unavailable. 2.  History of preeclampsia --Continue aspirin 3.   Low PAPP-A -- Serial scans for growth in third trimester q 4 weeks for diabetes and low PAPP-A - next is 12/21. 4.  History of club feet in prior child -- no other recommendations since [place] at [facility] was normal 5.  Obesity -- Was counseled that she is less likely to have persistent DM PP if she achieves an ideal body weight PP 6. Planning BTL   Plan:  Ultrasound at what gestational ages Monthly > 28 weeks   Antepartum Testing Twice weekly, Starting at 67 weeks   Delivery Mode Vaginal, Depending on presentation and EFW   Delivery at what gestational age [redacted] weeks, or 66 weeks if BS poorly controlled or evidence of fetopathy    Total Time Spent with Patient 45 minutes   >50% of visit spent in couseling/coordination of care yes   Office Use Only [phone]  Office Visit Level 5 (42min) EST comprehensive office/outpt   Coding Description: MATERNAL CONDITIONS/HISTORY  INDICATION(S).   Diabetes - Gestational GDM.   Obesity - BMI greater than equal to 30.  Electronic Signatures: Dellia Nims (MD)  (Signed 10-Dec-15 12:17)  Authored: Referral, Home Medications, Allergies, Vital Signs/Notes, Consult, Exam, Lab, Lab/Radiology Notes, Impression, Plan, Billing, Coding Description   Last Updated: 10-Dec-15 12:17 by Dellia Nims (MD)

## 2014-05-07 NOTE — Consult Note (Signed)
Referral Information:  Reason for Referral 24 yo gravida 3 para 56 at [redacted]w[redacted]d here for follow-up of her blood sugars; Pregnancy complicated by obesity (BMI = 33), gestational diabetes and low Papp-A.  History of preeclampsia in a prior pregnancy.  She was started on insulin December 10 and had an adjustment in her dose on December 17.  Her current dose is Lantus 28 units qhs and Novolog 08/21/08.   Referring Physician [place]   Prenatal Hx LMP 06/13/2013 and EDC of 03/19/2013 now 28 5/7 weeks. Pt has had elevated blood sugars this pregnancy and was started on glyburide 2.5mg  BID which was increased to 5 mg BID when her blood sugars remained high. On 11/30 she had her glyburide dose changed to 7.5 mg BID.  Last week she brought  her log with only 3 days of blood sugars for review.  Pt was referred to [facility] - seen 10/12/13, but she did not keep her follow-up appointments.  Her HGBA1c was 5.6% on 9/23. 2 weeks ago it was 7.1. She was started on Lantus/Novolog on December 23, 2013.  She had a normal screening fetal echocardiogram with pediatric cardiology 11/10.  She also has a history of preeclampsia in her first gestation requiring her to be induced at 36 weeks.  She is on aspirin.  At her first trimester screen, PAPP-A value was low, at the 2nd percentile.    Her younger son had bilateral club feet - now after surgeries at [facility] he is an active little boy with no problems.   Past Obstetrical Hx para 1- 2010 36 weeks, induced for preeclampsia, 5# female, NVD  para 2- 2011 37 weeks, gestational DM, no preeclampsia, club feet, 8#4oz female, NVD  para 3- current   Home Medications: Medication Instructions Status  Prenatal Multivitamins Prenatal Multivitamins oral tablet 1 tab(s) orally once a day Active  Tylenol 500 mg oral tablet 2 tab(s) orally every 6 hours, As Needed - for Pain Active  NovoLOG 100 units/mL subcutaneous solution 8 unit(s) subcutaneous 2 times a day Breakfast and Lunch Active   NovoLOG 100 units/mL subcutaneous solution 10 unit(s) subcutaneous once a day with Dinner Active  Lantus 100 units/mL subcutaneous solution 28 unit(s) subcutaneous once a day (at bedtime) Active   Allergies:   No Known Allergies:   Vital Signs/Notes:  Nursing Vital Signs: **Vital Signs.:   21-Dec-15 09:11  Vital Signs Type Routine  Temperature Temperature (F) 98.1  Celsius 36.7  Temperature Source oral  Pulse Pulse 113  Respirations Respirations 18  Systolic BP Systolic BP 856  Diastolic BP (mmHg) Diastolic BP (mmHg) 85  Mean BP 98  Pulse Ox % Pulse Ox % 98  Pulse Ox Activity Level  At rest  Oxygen Delivery Room Air/ 21 %   Perinatal Consult:  LMP 13-Jun-2013   Review Of Systems:  Fever/Chills No   Cough No   Abdominal Pain No   Diarrhea No   Constipation No   Nausea/Vomiting No   SOB/DOE No   Chest Pain No   Dysuria No   Tolerating Diet Yes   Medications/Allergies Reviewed Medications/Allergies reviewed     Routine Chem:  03-Dec-15 09:49   Hemoglobin A1c ([facility])  7.1 (The American Diabetes Association recommends that a primary goal of therapy should be <7% and that physicians should reevaluate the treatment regimen in patients with HbA1c values consistently >8%.)   [place]:    21-Dec-15 10:34, MFM OB US, Follow Up, Per Fetus  MFM OB US, Follow Up,  Per Fetus   History: Age: 45 years.  ____________________________________________________________________________  Dating:  LMP:     06/13/2013     EDC:     03/20/2014     GA by LMP:     [redacted]w[redacted]d  Current Scan on:     01/03/2014     EDC:     03/17/2014     GA by current scan:      [redacted]w[redacted]d  Best Overall Assessment:     10/11/2013     EDC:     03/20/2014     Assessed GA:      [redacted]w[redacted]d  The calculation of the gestational age by current scan was based on BPD, HC, AC,  FL and HUM.  The Best Overall Assessment is based on the LMP.  ____________________________________________________________________________  Anatomy  Scan:  Singleton gestation.  Biometry:  BPD     74.9     mm      -     [redacted]w[redacted]d     ([redacted]w[redacted]d to [redacted]w[redacted]d)  HC     273.9     mm      -     [redacted]w[redacted]d     ([redacted]w[redacted]d to [redacted]w[redacted]d)  AC     258.3     mm      -     [redacted]w[redacted]d     ([redacted]w[redacted]d to [redacted]w[redacted]d)  FL     55.7     mm      -     [redacted]w[redacted]d     ([redacted]w[redacted]d to [redacted]w[redacted]d)  HUM     49.8     mm      -     [redacted]w[redacted]d  EFW (lbs/oz)     3 lbs     3 ozs  EFW (g)     1450     g     60th%     Fetal heart activity: present. Fetal heart rate: 130 bpm.   Fetal presentation: cephalic.   Amniotic fluid: normal. AFI  16.2 cm.   Cord: visualized previously.     Fetal Anatomy:  Head: Appears normal.   Brain: Appears normal.   Face: Appears normal.   Spine: Appears normal  Neck / Skin: Appears normal.   Thorax: Appears normal.   Heart: Appears normal.   Abdominal Wall: visualized previously.   Gastrointestinal Tract: Appears normal.   Kidneys / Adrenal Glands: Appears normal.   Bladder: Appears normal.   Genitalia: visualized previously and was female.   Extremities: visualized previously.   Skeleton: visualized previously.    ____________________________________________________________________________  Report Summary:  Impression: Single intrauterine pregnancywith an estimated gestational age of  [redacted] weeks 1 day.  Dating assigned by LMP concordant with ultrasound performed at  [facility] on 09/06/2013; measurements at that exam reported as  12 weeks 3 days.    The patient was originally referred for the indication of elevated BMI and  history of previous child with bilateral club feet.  She has subsequently  developed insulin-requiring gestational diabetes.    Fetal anatomy visualized today or visualized previously and appears normal.      Appropriate interval growth. CODING DESCRIPTION:76816 - gestational  diabetes, elevated BMI.   Recommendations: Recommend follow up ultrasound for growth inn 4 weeks.    Thank you for allowing [place] to participate in her care.  Electronically  signed by:  Erasmo Score, MD    Additional Lab/Radiology Notes FBS 100-110   3/3 > 95 2hr post breakfast  81-130  1/3 > 120 2hr post lunch  115-130  1/3 > 120 2hr post dinner 110-126  2/3 > 120  12/17/2013 - TSH 0.915 (0.415 - 4.500) Free T4 not obtained, but FTI was 1.9 (1.2-4.9)   Impression/Recommendations:  Impression 24 yo gravida 3 para 1102 at [redacted]w[redacted]d with: 1.  Gestational diabetes - blood sugars markedly improved on insulin.  Feels tired, but not faint.    2.  History of preeclampsia - previously required induction at 36 weeks.  Normal baseline p/c ratio, BP stable today.  On aspirin. 3.  Low PAPP-A - associated with poor fetal growth and increased risk of preeclampsia - has f/up US on Monday. 4.  History of  club feet in prior child - normal on [place] at [facility] 5.  Obesity - has had diet counseling.    6.  Planning BTL per chart   Recommendations 1.  Gestational diabetes - blood sugars markedly improved on insulin.  Feels tired, but not faint.    Normal fetal growth. -- Insulin was increased to 30U Lantus and Novolog to 08/22/10.  Will likely need to continue to increase dose which she is aware of. -- She will return for BS review on December 28. -- The patient knows how to manage her hypoglycemia, but if it is persistent on her new regimen, she will contact us or [facility] when we are unavailable. -- She missed a [facility] appointment on 12/15 and declines to reschedule.  She understands how to use the insulin.  She has made dietary changes and is using sugar substitutes. -- Patient congratulated on the changes she's made in her lifestyle and ability to bring down her highest BS. 2.  History of preeclampsia --Continue aspirin and review signs/symptoms of preeclampsia. 3.   Low PAPP-A -- Serial scans for growth in third trimester q 4 weeks for diabetes and low PAPP-A - next was scheduled 4 weeks from now. 4.  History of club feet in prior child -- no other recommendations since [place]  at [facility] was normal 5.  Obesity -- She has been counseled that she is less likely to have persistent DM PP if she achieves an ideal body weight PP 6. Planning BTL   Plan:  Ultrasound at what gestational ages Monthly > 28 weeks   Antepartum Testing Twice weekly, Starting at 22 weeks   Delivery Mode Vaginal, Depending on presentation and EFW   Delivery at what gestational age [redacted] weeks, or 75 weeks if BS poorly controlled or evidence of fetopathy   Comment/Plan Thank you for allowing [place] to participate in her care.    Total Time Spent with Patient 15 minutes   >50% of visit spent in couseling/coordination of care yes   Office Use Only [id number]  Office Visit Level 3 (26min) EST exp prob focused outpt   Coding Description: MATERNAL CONDITIONS/HISTORY INDICATION(S).   Diabetes - Gestational GDM.   Obesity - BMI greater than equal to 30.  Electronic Signatures: Dellia Nims (MD)  (Signed 21-Dec-15 11:46)  Authored: Referral, Home Medications, Allergies, Vital Signs/Notes, Consult, Exam, Lab, Radiology, Lab/Radiology Notes, Impression, Plan, Billing, Coding Description   Last Updated: 21-Dec-15 11:46 by Dellia Nims (MD)

## 2014-05-15 NOTE — Op Note (Signed)
PATIENT NAME:  Carrie Burke, Carrie Burke MR#:  [id number] DATE OF BIRTH:  12/02/1990  DATE OF PROCEDURE:  04/07/2014  PREOPERATIVE DIAGNOSIS: Desires permanent sterility.   POSTOPERATIVE DIAGNOSIS: Desires permanent sterility.   OPERATION PERFORMED: Laparoscopic bilateral tubal ligation via Falope-Rings.   ANESTHESIA USED: General.   PRIMARY SURGEON: Malachy Mood, M.D.   ESTIMATED BLOOD LOSS: Minimal.   OPERATIVE FLUIDS: 600 mL.   URINE OUTPUT: 100 mL of clear urine.   PREOPERATIVE ANTIBIOTICS: None.   DRAINS OR TUBES: None.   IMPLANTS: Falope-Rings.   COMPLICATIONS: None.   INTRAOPERATIVE FINDINGS: Normal tubes, ovaries, and uterus as well as liver edge and remainder of visualized abdominal anatomy was normal as well. A 1 cm of knuckle of blanching tube bilateral, mid isthmic portion, visualized after placement of the Falope-Rings.   SPECIMENS REMOVED: None.   PATIENT CONDITION FOLLOWING PROCEDURE: Stable.   PROCEDURE IN DETAIL: Risks, benefits, and alternatives as well as permanent nature of this procedure and possible patient regret in patients under 30 were discussed with the patient as well as viable nonpermanent alternatives. The patient opted to proceed with surgery.  She was taken to the operating room where she was placed under general endotracheal anesthesia. She was positioned in the dorsal lithotomy position, prepped and draped in the usual sterile fashion. A timeout was performed. Attention was turned to the patient's pelvis. The bladder was straight catheterized with a red rubber catheter. An operative speculum was placed. A single-tooth tenaculum was used to grasp the anterior lip of the cervix and a Hulka tenaculum was then placed to allow manipulation of the uterus during the procedure. The single-tooth tenaculum was removed as was the sterile speculum. Attention was turned to the patient's abdomen. The umbilicus was infiltrated with 1% Sensorcaine. A stab incision was  made at the base of the umbilicus and a 5 mm XL trocar was used to gain direct entry into the peritoneal cavity using the camera within the sheath for visualization. Once in the intra-abdominal cavity, pneumoperitoneum was established. The 8 mm Falope ring trocar was then placed suprapubically under direct visualization. General inspection of the abdomen revealed the above findings. The patient was placed in slight Trendelenburg, and the bowels were removed out of the pelvic cul-de-sac. Attention was turned to the patient's left tube. It was grasped in the mid isthmic portion, ligated using the Falope-Ring. A good 1 cm blanching knuckle was visualized. Attention was then turned to the patient's right tube. It was ligated in a similar fashion. A general inspection of the abdomen, as noted above, revealed normal findings. Pneumoperitoneum was evacuated. The 8 mm port site was closed using 4-0 Monocryl in subcuticular fashion. Both port sites were then dressed with Dermabond. The Hulka tenaculum was removed. Sponge, needle, and instrument counts were correct x2. The patient tolerated the procedure well was taken to the recovery room in stable condition. ____________________________ Stoney Bang. Georgianne Fick, MD ams:sb D: 04/08/2014 07:21:45 ET T: 04/08/2014 11:04:02 ET JOB#: [id number]  cc: Stoney Bang. Georgianne Fick, MD, <Dictator> Dorthula Nettles MD ELECTRONICALLY SIGNED 04/19/2014 22:42

## 2014-05-17 DIAGNOSIS — Z720 Tobacco use: Secondary | ICD-10-CM | POA: Insufficient documentation

## 2014-05-17 DIAGNOSIS — Z3202 Encounter for pregnancy test, result negative: Secondary | ICD-10-CM | POA: Insufficient documentation

## 2014-05-17 DIAGNOSIS — R1031 Right lower quadrant pain: Secondary | ICD-10-CM | POA: Insufficient documentation

## 2014-05-18 ENCOUNTER — Emergency Department: Payer: Self-pay

## 2014-05-18 ENCOUNTER — Emergency Department
Admission: EM | Admit: 2014-05-18 | Discharge: 2014-05-18 | Disposition: A | Discharge status: Home / Self Care | Payer: Self-pay | Attending: Emergency Medicine | Admitting: Emergency Medicine

## 2014-05-18 ENCOUNTER — Encounter: Payer: Self-pay | Admitting: *Deleted

## 2014-05-18 DIAGNOSIS — R1031 Right lower quadrant pain: Secondary | ICD-10-CM

## 2014-05-18 DIAGNOSIS — R102 Pelvic and perineal pain: Secondary | ICD-10-CM

## 2014-05-18 HISTORY — DX: Gestational diabetes mellitus in pregnancy, unspecified control: O24.419

## 2014-05-18 HISTORY — DX: Unspecified pre-eclampsia, unspecified trimester: O14.90

## 2014-05-18 LAB — CHLAMYDIA/NGC RT PCR (ARMC ONLY)
CHLAMYDIA TR: NOT DETECTED
N GONORRHOEAE: NOT DETECTED

## 2014-05-18 LAB — COMPREHENSIVE METABOLIC PANEL
ALBUMIN: 4.7 g/dL (ref 3.5–5.0)
ALK PHOS: 88 U/L (ref 38–126)
ALT: 33 U/L (ref 14–54)
AST: 32 U/L (ref 15–41)
Anion gap: 10 (ref 5–15)
BILIRUBIN TOTAL: 0.5 mg/dL (ref 0.3–1.2)
BUN: 9 mg/dL (ref 6–20)
CHLORIDE: 105 mmol/L (ref 101–111)
CO2: 21 mmol/L — ABNORMAL LOW (ref 22–32)
CREATININE: 0.8 mg/dL (ref 0.44–1.00)
Calcium: 9.4 mg/dL (ref 8.9–10.3)
GLUCOSE: 90 mg/dL (ref 65–99)
POTASSIUM: 4.1 mmol/L (ref 3.5–5.1)
Sodium: 136 mmol/L (ref 135–145)
Total Protein: 8.1 g/dL (ref 6.5–8.1)

## 2014-05-18 LAB — CBC WITH DIFFERENTIAL/PLATELET
BASOS ABS: 0 10*3/uL (ref 0–0.1)
Basophils Relative: 1 %
Eosinophils Absolute: 0 10*3/uL (ref 0–0.7)
Eosinophils Relative: 0 %
HEMATOCRIT: 48.2 % — AB (ref 35.0–47.0)
Hemoglobin: 16.1 g/dL — ABNORMAL HIGH (ref 12.0–16.0)
Lymphs Abs: 0.9 10*3/uL — ABNORMAL LOW (ref 1.0–3.6)
MCH: 31.5 pg (ref 26.0–34.0)
MCHC: 33.4 g/dL (ref 32.0–36.0)
MCV: 94.2 fL (ref 80.0–100.0)
MONO ABS: 0.8 10*3/uL (ref 0.2–0.9)
NEUTROS ABS: 5.6 10*3/uL (ref 1.4–6.5)
Neutrophils Relative %: 76 %
Platelets: 116 10*3/uL — ABNORMAL LOW (ref 150–440)
RBC: 5.12 MIL/uL (ref 3.80–5.20)
RDW: 13.5 % (ref 11.5–14.5)
WBC: 7.3 10*3/uL (ref 3.6–11.0)

## 2014-05-18 LAB — URINALYSIS COMPLETE WITH MICROSCOPIC (ARMC ONLY)
BILIRUBIN URINE: NEGATIVE
Glucose, UA: NEGATIVE mg/dL
HGB URINE DIPSTICK: NEGATIVE
Ketones, ur: NEGATIVE mg/dL
Nitrite: NEGATIVE
Protein, ur: NEGATIVE mg/dL
Specific Gravity, Urine: 1.009 (ref 1.005–1.030)
pH: 7 (ref 5.0–8.0)

## 2014-05-18 LAB — WET PREP, GENITAL
Clue Cells Wet Prep HPF POC: NONE SEEN
TRICH WET PREP: NONE SEEN
Yeast Wet Prep HPF POC: NONE SEEN

## 2014-05-18 LAB — POCT PREGNANCY, URINE: Preg Test, Ur: NEGATIVE

## 2014-05-18 MED ORDER — ONDANSETRON HCL 4 MG/2ML IJ SOLN: 4.0000 mg | Freq: Once | INTRAMUSCULAR | Status: DC

## 2014-05-18 MED ORDER — SODIUM CHLORIDE 0.9 % IV BOLUS (SEPSIS): 1000.0000 mL | Freq: Once | INTRAVENOUS | Status: DC

## 2014-05-18 MED ORDER — DOXYCYCLINE HYCLATE 100 MG PO TABS
100.0000 mg | ORAL_TABLET | Freq: Once | ORAL | Status: AC
  Administered 2014-05-18: 100 mg via ORAL
  Filled 2014-05-18: qty 1

## 2014-05-18 MED ORDER — CEFTRIAXONE SODIUM 250 MG IJ SOLR
INTRAMUSCULAR | Status: AC
  Administered 2014-05-18: 250 mg via INTRAMUSCULAR
  Filled 2014-05-18: qty 250

## 2014-05-18 MED ORDER — TRAMADOL HCL 50 MG PO TABS: 50.0000 mg | ORAL_TABLET | Freq: Four times a day (QID) | ORAL | Status: DC | PRN

## 2014-05-18 MED ORDER — CEFTRIAXONE SODIUM 250 MG IJ SOLR
250.0000 mg | Freq: Once | INTRAMUSCULAR | Status: AC
  Administered 2014-05-18: 250 mg via INTRAMUSCULAR

## 2014-05-18 MED ORDER — DOXYCYCLINE HYCLATE 100 MG PO TABS: 100.0000 mg | ORAL_TABLET | Freq: Two times a day (BID) | ORAL | Status: DC

## 2014-05-18 MED ORDER — MORPHINE SULFATE 4 MG/ML IJ SOLN: 4.0000 mg | Freq: Once | INTRAMUSCULAR | Status: DC

## 2014-05-18 MED ORDER — OXYCODONE-ACETAMINOPHEN 5-325 MG PO TABS
2.0000 | ORAL_TABLET | Freq: Once | ORAL | Status: AC
  Administered 2014-05-18: 2 via ORAL

## 2014-05-18 MED ORDER — OXYCODONE-ACETAMINOPHEN 5-325 MG PO TABS
ORAL_TABLET | ORAL | Status: AC
  Administered 2014-05-18: 2 via ORAL
  Filled 2014-05-18: qty 2

## 2014-05-18 NOTE — Discharge Instructions (Signed)

## 2014-05-18 NOTE — ED Provider Notes (Signed)
[facility] Emergency Department Provider Note  Time seen: 2:56 AM  I have reviewed the triage vital signs and the nursing notes.   HISTORY  Chief Complaint Abdominal Pain    HPI Carrie Burke is a 24 y.o. female with a past medical history of gestational diabetes and preeclampsia who presents to the emergency department with right lower quadrant pain 3-4 days. According to the patient she has had sharp right lower quadrant pain for the past 4 days. Waxing and waning in intensity, at its most severe it is a 10/10, currently a 7/10. Denies any nausea or vomiting. Patient states subjective fever tonight but did not measure her temperature. Pain is worse with pushing on the area or movement. Denies any diarrhea, black or bloody stool. Denies dysuria or hematuria. Denies any vaginal bleeding, but does state increased discharge.     Past Medical History  Diagnosis Date  . Gestational diabetes   . Preeclampsia     There are no active problems to display for this patient.   Past Surgical History  Procedure Laterality Date  . Tubal ligation    . Clavicle surgery      No current outpatient prescriptions on file.  Allergies Review of patient's allergies indicates no known allergies.  No family history on file.  Social History History  Substance Use Topics  . Smoking status: Current Every Day Smoker  . Smokeless tobacco: Not on file  . Alcohol Use: Yes    Review of Systems Constitutional: Positive for subjective fever Cardiovascular: Negative for chest pain. Respiratory: Negative for shortness of breath. Gastrointestinal: Positive for right lower quadrant abdominal pain.  Genitourinary: Negative for dysuria. Musculoskeletal: Negative for back pain.  10-point ROS otherwise negative.  ____________________________________________   PHYSICAL EXAM:  VITAL SIGNS: ED Triage Vitals  Enc Vitals Group     BP 05/18/14 0022 136/93 mmHg     Pulse  Rate 05/18/14 0022 105     Resp 05/18/14 0022 18     Temp 05/18/14 0022 100.4 F (38 C)     Temp Source 05/18/14 0022 Oral     SpO2 05/18/14 0022 100 %     Weight 05/18/14 0022 176 lb (79.833 kg)     Height 05/18/14 0022 5\' 7"  (1.702 m)     Head Cir --      Peak Flow --      Pain Score 05/18/14 0022 10     Pain Loc --      Pain Edu? --      Excl. in [place]? --     Constitutional: Alert and oriented. Well appearing and in no distress. Cardiovascular: Normal rate, regular rhythm. No murmurs, rubs, or gallops. Respiratory: Normal respiratory effort without tachypnea nor retractions. Breath sounds are clear and equal bilaterally. No wheezes/rales/rhonchi. Gastrointestinal: Moderate right pelvic tenderness to palpation. No rebound or guarding. Minimal tenderness over McBurney's point, her tenderness is inferior to that location. Musculoskeletal: Nontender with normal range of motion in all extremities.  Neurologic:  Normal speech and language. No gross focal neurologic deficits  Skin:  Skin is warm, dry and intact.  Psychiatric: Mood and affect are normal. Speech and behavior are normal.   ____________________________________________    RADIOLOGY  Pelvic ultrasound within normal limits.  INITIAL IMPRESSION / ASSESSMENT AND PLAN / ED COURSE  Pertinent labs & imaging results that were available during my care of the patient were reviewed by me and considered in my medical decision making (see chart for  details).  24 year old female who presents with 4 days of right pelvic pain. Labs are largely within normal limits. No white count elevation. Vital signs show a temperature of 100.4. Given mild vaginal discharge will perform a pelvic exam and likely proceed with ultrasound. Patient's tenderness appears to be lower than McBurney's point.  ----------------------------------------- 3:37 AM on 05/18/2014 -----------------------------------------  Pelvic exam consistent with moderate to  severe right adnexal tenderness palpation, cervical erythema consistent with cervicitis and moderate white discharge. Swabs have been sent. We'll proceed with ultrasound to evaluate. If ultrasound within normal limits will consider CT scan to evaluate appendix.  ----------------------------------------- 5:17 AM on 05/18/2014 -----------------------------------------  Ultrasound shows normal results, no free fluid, no TOA. Wet prep consistent with moderate white blood cells, otherwise negative. Pelvic exam consistent with cervicitis. We'll treat patient with Rocephin and doxycycline. I discussed with patient CT scan, patient is refusing at this time does not want an IV. I discussed with the patient trial of antibiotics and to return if pain worsens patient is agreeable to plan. ____________________________________________   FINAL CLINICAL IMPRESSION(S) / ED DIAGNOSES  Right lower quadrant abdominal pain.   Harvest Dark, MD 05/18/14 [phone]

## 2014-05-18 NOTE — ED Notes (Signed)
Pt reports lower right abdominal pain "feels like its my ovary" that radiates to right hip and down right leg x 3 days. Last took iburprofen for pain at 2230

## 2014-05-19 ENCOUNTER — Emergency Department
Admission: EM | Admit: 2014-05-19 | Discharge: 2014-05-19 | Disposition: A | Discharge status: Home / Self Care | Payer: Self-pay | Attending: Emergency Medicine | Admitting: Emergency Medicine

## 2014-05-19 ENCOUNTER — Emergency Department: Payer: Self-pay

## 2014-05-19 ENCOUNTER — Encounter: Payer: Self-pay | Admitting: Emergency Medicine

## 2014-05-19 DIAGNOSIS — Z720 Tobacco use: Secondary | ICD-10-CM | POA: Insufficient documentation

## 2014-05-19 DIAGNOSIS — R1110 Vomiting, unspecified: Secondary | ICD-10-CM | POA: Insufficient documentation

## 2014-05-19 DIAGNOSIS — R1031 Right lower quadrant pain: Secondary | ICD-10-CM | POA: Insufficient documentation

## 2014-05-19 LAB — COMPREHENSIVE METABOLIC PANEL
ALK PHOS: 75 U/L (ref 38–126)
ALT: 35 U/L (ref 14–54)
AST: 29 U/L (ref 15–41)
Albumin: 4.4 g/dL (ref 3.5–5.0)
Anion gap: 10 (ref 5–15)
BUN: 9 mg/dL (ref 6–20)
CHLORIDE: 104 mmol/L (ref 101–111)
CO2: 25 mmol/L (ref 22–32)
CREATININE: 0.83 mg/dL (ref 0.44–1.00)
Calcium: 8.9 mg/dL (ref 8.9–10.3)
GFR calc non Af Amer: >60 mL/min (ref >60)
GLUCOSE: 112 mg/dL — AB (ref 65–99)
Potassium: 3.9 mmol/L (ref 3.5–5.1)
Sodium: 139 mmol/L (ref 135–145)
Total Bilirubin: 0.4 mg/dL (ref 0.3–1.2)
Total Protein: 7.7 g/dL (ref 6.5–8.1)

## 2014-05-19 LAB — URINALYSIS COMPLETE WITH MICROSCOPIC (ARMC ONLY)
Bilirubin Urine: NEGATIVE
GLUCOSE, UA: NEGATIVE mg/dL
Hgb urine dipstick: NEGATIVE
KETONES UR: NEGATIVE mg/dL
NITRITE: NEGATIVE
Protein, ur: NEGATIVE mg/dL
SPECIFIC GRAVITY, URINE: 1.021 (ref 1.005–1.030)
pH: 5 (ref 5.0–8.0)

## 2014-05-19 LAB — CBC WITH DIFFERENTIAL/PLATELET
BASOS ABS: 0 10*3/uL (ref 0–0.1)
Basophils Relative: 0 %
EOS PCT: 0 %
Eosinophils Absolute: 0 10*3/uL (ref 0–0.7)
HCT: 44.4 % (ref 35.0–47.0)
Hemoglobin: 14.9 g/dL (ref 12.0–16.0)
Lymphocytes Relative: 21 %
Lymphs Abs: 1.4 10*3/uL (ref 1.0–3.6)
MCH: 31.5 pg (ref 26.0–34.0)
MCHC: 33.6 g/dL (ref 32.0–36.0)
MCV: 94 fL (ref 80.0–100.0)
MONO ABS: 0.6 10*3/uL (ref 0.2–0.9)
MONOS PCT: 9 %
Neutro Abs: 4.6 10*3/uL (ref 1.4–6.5)
Neutrophils Relative %: 70 %
Platelets: 108 10*3/uL — ABNORMAL LOW (ref 150–440)
RBC: 4.73 MIL/uL (ref 3.80–5.20)
RDW: 13.5 % (ref 11.5–14.5)
WBC: 6.6 10*3/uL (ref 3.6–11.0)

## 2014-05-19 MED ORDER — DICYCLOMINE HCL 10 MG PO CAPS: 10.0000 mg | ORAL_CAPSULE | Freq: Four times a day (QID) | ORAL | Status: DC | PRN

## 2014-05-19 MED ORDER — IOHEXOL 240 MG/ML SOLN
25.0000 mL | Freq: Once | INTRAMUSCULAR | Status: AC | PRN
  Administered 2014-05-19: 25 mL via ORAL
  Filled 2014-05-19: qty 25

## 2014-05-19 MED ORDER — IOHEXOL 300 MG/ML  SOLN
100.0000 mL | Freq: Once | INTRAMUSCULAR | Status: AC | PRN
  Administered 2014-05-19: 100 mL via INTRAVENOUS
  Filled 2014-05-19: qty 100

## 2014-05-19 MED ORDER — ONDANSETRON HCL 4 MG/2ML IJ SOLN
4.0000 mg | Freq: Once | INTRAMUSCULAR | Status: AC
  Administered 2014-05-19: 4 mg via INTRAVENOUS

## 2014-05-19 MED ORDER — ONDANSETRON HCL 4 MG/2ML IJ SOLN
INTRAMUSCULAR | Status: AC
  Administered 2014-05-19: 4 mg via INTRAVENOUS
  Filled 2014-05-19: qty 2

## 2014-05-19 MED ORDER — MORPHINE SULFATE 4 MG/ML IJ SOLN
INTRAMUSCULAR | Status: AC
  Administered 2014-05-19: 4 mg via INTRAVENOUS
  Filled 2014-05-19: qty 1

## 2014-05-19 MED ORDER — MORPHINE SULFATE 4 MG/ML IJ SOLN
4.0000 mg | Freq: Once | INTRAMUSCULAR | Status: AC
  Administered 2014-05-19: 4 mg via INTRAVENOUS

## 2014-05-19 MED ORDER — SODIUM CHLORIDE 0.9 % IV BOLUS (SEPSIS)
1000.0000 mL | Freq: Once | INTRAVENOUS | Status: AC
  Administered 2014-05-19: 1000 mL via INTRAVENOUS

## 2014-05-19 NOTE — ED Notes (Signed)
Pt reports that she was here yesterday and left. She states that the person in her IV made her mad so she didn't want to be treated any longer. Was told that if she ran a fever or started vomiting to come back. She was given Doxy yesterday to treat an infection.

## 2014-05-19 NOTE — ED Provider Notes (Signed)
[facility] Emergency Department Provider Note    ____________________________________________  Time seen: 3:35 PM  I have reviewed the triage vital signs and the nursing notes.   HISTORY  Chief Complaint Abdominal Pain and Emesis       HPI Carrie Burke is a 24 y.o. female who is returning from home one day after being discharged from the emergency department with cervicitis. At the time there was a concern for appendicitis, however the patient was discharged with a trial of antibiotics for cervicitis and is now back with persistent abdominal pain which she describes as a cramping right lower quadrant pain which is a 7 out of 10. The patient did have several does of vomiting this morning. She is back because her symptoms are not resolving. Note the patient did have a negative ultrasound on her previous visit one day ago. She says that she does not have a concern for STDs because of sexual activity only with her husband. She says that the pain is constant without any modifying factors.     Past Medical History  Diagnosis Date  . Gestational diabetes   . Preeclampsia     There are no active problems to display for this patient.   Past Surgical History  Procedure Laterality Date  . Tubal ligation    . Clavicle surgery      Current Outpatient Rx  Name  Route  Sig  Dispense  Refill  . doxycycline (VIBRA-TABS) 100 MG tablet   Oral   Take 1 tablet (100 mg total) by mouth 2 (two) times daily.   20 tablet   0   . traMADol (ULTRAM) 50 MG tablet   Oral   Take 1 tablet (50 mg total) by mouth every 6 (six) hours as needed.   20 tablet   0     Allergies Review of patient's allergies indicates no known allergies.  No family history on file.  Social History History  Substance Use Topics  . Smoking status: Current Every Day Smoker  . Smokeless tobacco: Not on file  . Alcohol Use: Yes    Review of Systems  Constitutional: Subjective  fever  Eyes: Negative for visual changes. ENT: Negative for sore throat. Cardiovascular: Negative for chest pain. Respiratory: Negative for shortness of breath. Gastrointestinal: Right lower quadrant abdominal pain. Positive for nausea and vomiting. Genitourinary: Negative for dysuria. Musculoskeletal: Negative for back pain. Skin: Negative for rash. Neurological: Negative for headaches, focal weakness or numbness.   10-point ROS otherwise negative.  ____________________________________________   PHYSICAL EXAM:  VITAL SIGNS: ED Triage Vitals  Enc Vitals Group     BP 05/19/14 1509 131/64 mmHg     Pulse Rate 05/19/14 1509 85     Resp --      Temp 05/19/14 1509 97.3 F (36.3 C)     Temp Source 05/19/14 1509 Oral     SpO2 05/19/14 1509 98 %     Weight 05/19/14 1509 176 lb (79.833 kg)     Height 05/19/14 1509 5\' 7"  (1.702 m)     Head Cir --      Peak Flow --      Pain Score 05/19/14 1510 7     Pain Loc --      Pain Edu? --      Excl. in [place]? --      Constitutional: Alert and oriented. Well appearing and in no distress. Eyes: Conjunctivae are normal. PERRL. Normal extraocular movements. ENT   Head:  Normocephalic and atraumatic.   Nose: No congestion/rhinnorhea.   Mouth/Throat: Mucous membranes are moist.   Neck: No stridor. Hematological/Lymphatic/Immunilogical: No cervical lymphadenopathy. Cardiovascular: Normal rate, regular rhythm. Normal and symmetric distal pulses are present in all extremities. No murmurs, rubs, or gallops. Respiratory: Normal respiratory effort without tachypnea nor retractions. Breath sounds are clear and equal bilaterally. No wheezes/rales/rhonchi. Gastrointestinal: Abdomen is soft with mild diffuse tenderness and tenderness greatest over McBurney's point. No distention. No abdominal bruits. There is no CVA tenderness. Genitourinary: Deferred  Musculoskeletal: Nontender with normal range of motion in all extremities. No joint  effusions.  No lower extremity tenderness nor edema. Neurologic:  Normal speech and language. No gross focal neurologic deficits are appreciated. Speech is normal. No gait instability. Skin:  Skin is warm, dry and intact. No rash noted. Psychiatric: Mood and affect are normal. Speech and behavior are normal. Patient exhibits appropriate insight and judgment.  ____________________________________________    LABS (pertinent positives/negatives)  Labs Reviewed  COMPREHENSIVE METABOLIC PANEL - Abnormal; Notable for the following:    Glucose, Bld 112 (*)    All other components within normal limits  URINALYSIS COMPLETEWITH MICROSCOPIC ([facility])  - Abnormal; Notable for the following:    Color, Urine YELLOW (*)    APPearance CLOUDY (*)    Leukocytes, UA TRACE (*)    Bacteria, UA RARE (*)    Squamous Epithelial / LPF TOO NUMEROUS TO COUNT (*)    All other components within normal limits  CBC WITH DIFFERENTIAL/PLATELET - Abnormal; Notable for the following:    Platelets 108 (*)    All other components within normal limits  PREGNANCY, URINE     ____________________________________________   EKG    ____________________________________________    RADIOLOGY  Normal CT of the abdomen and pelvis.  ____________________________________________   PROCEDURES  Procedure(s) performed:   Critical Care performed:   ____________________________________________   INITIAL IMPRESSION / ASSESSMENT AND PLAN / ED COURSE  Pertinent labs & imaging results that were available during my care of the patient were reviewed by me and considered in my medical decision making (see chart for details).  Plan will be to proceed with a CAT scan today. The patient is in agreement because of persistent pain which has not resolved with doxycycline treatment. The primary concern at this time is appendicitis.  ----------------------------------------- 6:44 PM on  05/19/2014 -----------------------------------------  Patient is resting comfortably in bed. Was updated about results. Able tolerate by mouth contrast without any vomiting in the emergency department. Possible viral etiology for vomiting and abdominal pain. Her labs are also reassuring without a worsening or abnormal white count. These will be discharged home. Will continue to take her antibiotics from the previous visit and continue with her follow-up plan for the previous visit. ____________________________________________   FINAL CLINICAL IMPRESSION(S) / ED DIAGNOSES  Acute nausea, vomiting, rlq abdominal pain. Return visit.   Doran Stabler, MD 05/19/14 [phone]

## 2014-05-19 NOTE — ED Notes (Signed)
Pt with abd pain and vomiting. Pt seen here on Tuesday and dx with possible appendicitis. Pt left without having a ct scan complete due to leaving. Was told to return if she spike a fever or had any new symptoms. Pt returns today and reports increased abd pain and vomiting.

## 2014-05-19 NOTE — Discharge Instructions (Signed)
Abdominal Pain, Women °Abdominal (stomach, pelvic, or belly) pain can be caused by many things. It is important to tell your doctor: °· The location of the pain. °· Does it come and go or is it present all the time? °· Are there things that start the pain (eating certain foods, exercise)? °· Are there other symptoms associated with the pain (fever, nausea, vomiting, diarrhea)? °All of this is helpful to know when trying to find the cause of the pain. °CAUSES  °· Stomach: virus or bacteria infection, or ulcer. °· Intestine: appendicitis (inflamed appendix), regional ileitis (Crohn's disease), ulcerative colitis (inflamed colon), irritable bowel syndrome, diverticulitis (inflamed diverticulum of the colon), or cancer of the stomach or intestine. °· Gallbladder disease or stones in the gallbladder. °· Kidney disease, kidney stones, or infection. °· Pancreas infection or cancer. °· Fibromyalgia (pain disorder). °· Diseases of the female organs: °¨ Uterus: fibroid (non-cancerous) tumors or infection. °¨ Fallopian tubes: infection or tubal pregnancy. °¨ Ovary: cysts or tumors. °¨ Pelvic adhesions (scar tissue). °¨ Endometriosis (uterus lining tissue growing in the pelvis and on the pelvic organs). °¨ Pelvic congestion syndrome (female organs filling up with blood just before the menstrual period). °¨ Pain with the menstrual period. °¨ Pain with ovulation (producing an egg). °¨ Pain with an IUD (intrauterine device, birth control) in the uterus. °¨ Cancer of the female organs. °· Functional pain (pain not caused by a disease, may improve without treatment). °· Psychological pain. °· Depression. °DIAGNOSIS  °Your doctor will decide the seriousness of your pain by doing an examination. °· Blood tests. °· X-rays. °· Ultrasound. °· CT scan (computed tomography, special type of X-ray). °· MRI (magnetic resonance imaging). °· Cultures, for infection. °· Barium enema (dye inserted in the large intestine, to better view it with  X-rays). °· Colonoscopy (looking in intestine with a lighted tube). °· Laparoscopy (minor surgery, looking in abdomen with a lighted tube). °· Major abdominal exploratory surgery (looking in abdomen with a large incision). °TREATMENT  °The treatment will depend on the cause of the pain.  °· Many cases can be observed and treated at home. °· Over-the-counter medicines recommended by your caregiver. °· Prescription medicine. °· Antibiotics, for infection. °· Birth control pills, for painful periods or for ovulation pain. °· Hormone treatment, for endometriosis. °· Nerve blocking injections. °· Physical therapy. °· Antidepressants. °· Counseling with a psychologist or psychiatrist. °· Minor or major surgery. °HOME CARE INSTRUCTIONS  °· Do not take laxatives, unless directed by your caregiver. °· Take over-the-counter pain medicine only if ordered by your caregiver. Do not take aspirin because it can cause an upset stomach or bleeding. °· Try a clear liquid diet (broth or water) as ordered by your caregiver. Slowly move to a bland diet, as tolerated, if the pain is related to the stomach or intestine. °· Have a thermometer and take your temperature several times a day, and record it. °· Bed rest and sleep, if it helps the pain. °· Avoid sexual intercourse, if it causes pain. °· Avoid stressful situations. °· Keep your follow-up appointments and tests, as your caregiver orders. °· If the pain does not go away with medicine or surgery, you may try: °¨ Acupuncture. °¨ Relaxation exercises (yoga, meditation). °¨ Group therapy. °¨ Counseling. °SEEK MEDICAL CARE IF:  °· You notice certain foods cause stomach pain. °· Your home care treatment is not helping your pain. °· You need stronger pain medicine. °· You want your IUD removed. °· You feel faint or   lightheaded. °· You develop nausea and vomiting. °· You develop a rash. °· You are having side effects or an allergy to your medicine. °SEEK IMMEDIATE MEDICAL CARE IF:  °· Your  pain does not go away or gets worse. °· You have a fever. °· Your pain is felt only in portions of the abdomen. The right side could possibly be appendicitis. The left lower portion of the abdomen could be colitis or diverticulitis. °· You are passing blood in your stools (bright red or black tarry stools, with or without vomiting). °· You have blood in your urine. °· You develop chills, with or without a fever. °· You pass out. °MAKE SURE YOU:  °· Understand these instructions. °· Will watch your condition. °· Will get help right away if you are not doing well or get worse. °Document Released: 10/28/2006 Document Revised: 05/17/2013 Document Reviewed: 11/17/2008 °ExitCare® Patient Information ©2015 [employer]. This information is not intended to replace advice given to you by your health care provider. Make sure you discuss any questions you have with your health care provider. ° °

## 2014-05-24 NOTE — H&P (Signed)
L&D Evaluation:  History:  HPI Patient is a 24yo [id number] at [redacted]w[redacted]D by LMP c/w 1st trimester [place] with EDC of 03/20/14.  Presents today via EMS for contractions.  +FM, no LOF, light bleeding  Pregnancy issues: diabetes in pregnancy (pre-gestational): poor control, currently on insuling and transferred to [facility].  Reports fetal weight on 02/11/14 [place] at [facility] was 5lbs Fetal echo WNL. hx preeclampsia, on ASA 81 hx of baby w club feet - this [place] normal desires BTL   Presents with back pain, fall   Patient's Medical History No Chronic Illness   Patient's Surgical History none   Medications Pre Natal Vitamins  insulin (Novalog 8U, 8U, 12U and Lantus 36U at bedtime)   Allergies NKDA   Social History none   Family History Non-Contributory   ROS:  ROS All systems were reviewed.  HEENT, CNS, GI, GU, Respiratory, CV, Renal and Musculoskeletal systems were found to be normal.   Exam:  Vital Signs stable   Urine Protein not completed   General no apparent distress   Mental Status clear   Chest clear   Heart normal sinus rhythm   Abdomen gravid, non-tender   Estimated Fetal Weight Average for gestational age   Fetal Position cephalic   Back no CVAT   Pelvic no external lesions, 9cm/C/0   Mebranes Intact   FHT normal rate with no decels, 135, mod, no accels, no decels   Ucx irregular, q3-45min   Impression:  Impression 24 year old [id number] at [redacted]w[redacted]d with obesity, IDDM   Plan:  Plan discharge   Comments 1) IDDM - anticipate iminent delivery, no time to start insuling gtt.  BG 112 in EMS will verify here.   - Ampicillin for GBS ppx - BG 112 per EMS  2) Fetus - category I tracing - 5lbs on [place] 3 days ago - pelvis tested to 8lbs - GBS as above given <37 week GBS unknown  3) B+ / remainder of labs not in chart will check clinic chart  4) Disposition - pending delivery   Follow Up Appointment already scheduled   Electronic Signatures: Dorthula Nettles (MD)  (Signed  31-Jan-16 10:50)  Authored: L&D Evaluation   Last Updated: 31-Jan-16 10:50 by Dorthula Nettles (MD)

## 2014-05-24 NOTE — H&P (Signed)
L&D Evaluation:  History:  HPI 24 year old G3 P61 with EDC=03/20/2014 presents at 27.[redacted] weeks gestation with c/o sudden onset of sharp shooting pains in the vagina which began at 1150 while driving the car. Has had some "tightening" of the uterus occasionally-but not acssociated with pain. Pain has let up with rest. No vaginal bleeding, LOF, dysuria. Denies any falls. c/o of decreased FM since pains began. PNC c/b uncontrolled GDMA2-patient does not follow diet and does not take the dose of glyburide prescribed. Has been taking 5 mgm BID. Also has a BMI> 30 and a Papp-A<2%.  Had preeclampsia with G1 requiring preterm induction and uncontrolled GDM with G2 requiring IOL. Fetal echo and MSAFP WNL.   Presents with decreased fetal movement, and vaginal pains   Patient's Medical History GDMA2, Obesity   Patient's Surgical History repair fx collarbone   Medications Pre Natal Vitamins  Glyburide 5 mgm BID   Allergies NKDA   Social History tobacco   ROS:  ROS see HPI   Exam:  Vital Signs stable  115/49   Urine Protein negative dipstick, UA ess WNL except for large glucose.   General no apparent distress   Mental Status clear   Abdomen gravid, tender LUS   Pelvic no external lesions, Cx closed 25-50%/-2 and ballotable   Mebranes Intact   FHT normal rate with no decels, 150s baseline with accels to 170s to 180   Ucx some uterine irritability with 2 ctxs in 30 min, which seem to resolve with po hydration   Impression:  Impression IUP at 27.3 weeks. Reactive NST. Sharp vaginal pains probably due to fetal position. No evidence of PTL   Plan:  Plan DC home. Encouraged water intake, not diet pepsi. Has FU ultrasound with DP in AM.   Electronic Signatures: Karene Fry (CNM)  (Signed 09-Dec-15 23:20)  Authored: L&D Evaluation   Last Updated: 09-Dec-15 23:20 by Karene Fry (CNM)

## 2014-05-24 NOTE — H&P (Signed)
L&D Evaluation:  History:  HPI ***late entry from 01/05/14 at 19:30***  Patient is a 24yo [id number] at 29w and 3d by LMP c/w 1st trimester [place] with EDC of 03/20/14.  Presents today for evaluation after a fall from standing, landing on her right thigh and buttocks.  This occurred around 12:00pm.  She was evaluated in ED and cleared and reported to L&D for fetal/contractions eval.  She states she has low back pain and some pelvic pain.  NO VB, LOF, CTX +FM.    Pregnancy issues: diabetes in pregnancy (pre-gestational): poor control, now on insulin with better control, not yet optimized.  followed with [facility].  Fetal echo WNL. hx preeclampsia, on ASA 81 hx of baby w club feet - this [place] normal desires BTL   Presents with back pain, fall   Patient's Medical History No Chronic Illness   Patient's Surgical History none   Medications Pre Natal Vitamins  insulin   Allergies NKDA   Social History none   Family History Non-Contributory   ROS:  ROS All systems were reviewed.  HEENT, CNS, GI, GU, Respiratory, CV, Renal and Musculoskeletal systems were found to be normal.   Exam:  Vital Signs stable   Urine Protein not completed   General no apparent distress   Mental Status clear   Chest clear   Heart normal sinus rhythm   Abdomen gravid, non-tender   Estimated Fetal Weight Average for gestational age   Fetal Position cephalic   Back no CVAT   Pelvic not assessed   Mebranes Intact   FHT normal rate with no decels, + accels   Fetal Heart Rate 150   Ucx absent   Impression:  Impression s/p fall without contractions   Plan:  Plan discharge   Comments keep next appointment at both [place] and [facility] (monday).  call if concerns of VB, LOF, CTX, decreased FM.   Follow Up Appointment already scheduled   Electronic Signatures: Shade Rivenbark, Honor Loh (MD)  (Signed 24-Dec-15 03:41)  Authored: L&D Evaluation   Last Updated: 24-Dec-15 03:41 by Sarai January, Honor Loh (MD)

## 2014-07-27 DIAGNOSIS — E559 Vitamin D deficiency, unspecified: Secondary | ICD-10-CM | POA: Insufficient documentation

## 2014-07-27 DIAGNOSIS — F319 Bipolar disorder, unspecified: Secondary | ICD-10-CM | POA: Insufficient documentation

## 2014-07-27 DIAGNOSIS — R740 Nonspecific elevation of levels of transaminase and lactic acid dehydrogenase [LDH]: Secondary | ICD-10-CM

## 2014-07-27 DIAGNOSIS — R7401 Elevation of levels of liver transaminase levels: Secondary | ICD-10-CM | POA: Insufficient documentation

## 2014-07-29 ENCOUNTER — Ambulatory Visit: Payer: Self-pay | Admitting: Family Medicine

## 2014-08-12 ENCOUNTER — Ambulatory Visit: Payer: Self-pay | Admitting: Family Medicine

## 2015-02-27 ENCOUNTER — Encounter: Payer: Self-pay | Admitting: Family Medicine

## 2015-02-27 ENCOUNTER — Ambulatory Visit (INDEPENDENT_AMBULATORY_CARE_PROVIDER_SITE_OTHER): Payer: Managed Care, Other (non HMO) | Admitting: Family Medicine

## 2015-02-27 ENCOUNTER — Ambulatory Visit
Admission: RE | Admit: 2015-02-27 | Discharge: 2015-02-27 | Disposition: A | Discharge status: Home / Self Care | Payer: Managed Care, Other (non HMO) | Source: Ambulatory Visit | Attending: Family Medicine | Admitting: Family Medicine

## 2015-02-27 VITALS — BP 128/81 | HR 79 | Temp 99.1°F | Ht 65.0 in | Wt 171.6 lb

## 2015-02-27 DIAGNOSIS — R7401 Elevation of levels of liver transaminase levels: Secondary | ICD-10-CM

## 2015-02-27 DIAGNOSIS — L259 Unspecified contact dermatitis, unspecified cause: Secondary | ICD-10-CM

## 2015-02-27 DIAGNOSIS — D696 Thrombocytopenia, unspecified: Secondary | ICD-10-CM

## 2015-02-27 DIAGNOSIS — M25511 Pain in right shoulder: Secondary | ICD-10-CM | POA: Insufficient documentation

## 2015-02-27 DIAGNOSIS — R5383 Other fatigue: Secondary | ICD-10-CM | POA: Diagnosis not present

## 2015-02-27 DIAGNOSIS — F3131 Bipolar disorder, current episode depressed, mild: Secondary | ICD-10-CM | POA: Diagnosis not present

## 2015-02-27 DIAGNOSIS — E559 Vitamin D deficiency, unspecified: Secondary | ICD-10-CM

## 2015-02-27 DIAGNOSIS — R740 Nonspecific elevation of levels of transaminase and lactic acid dehydrogenase [LDH]: Secondary | ICD-10-CM | POA: Diagnosis not present

## 2015-02-27 MED ORDER — TRIAMCINOLONE ACETONIDE 0.5 % EX CREA: 1.0000 "application " | TOPICAL_CREAM | Freq: Three times a day (TID) | CUTANEOUS | Status: DC

## 2015-02-27 MED ORDER — TRAZODONE HCL 50 MG PO TABS: 50.0000 mg | ORAL_TABLET | Freq: Every evening | ORAL | Status: DC | PRN

## 2015-02-27 NOTE — Assessment & Plan Note (Signed)
Check and supplement if needed

## 2015-02-27 NOTE — Progress Notes (Signed)
BP 128/81 mmHg  Pulse 79  Temp(Src) 99.1 F (37.3 C)  Ht 5\' 5"  (1.651 m)  Wt 171 lb 9.6 oz (77.837 kg)  BMI 28.56 kg/m2  SpO2 98%  LMP 02/01/2015 (Exact Date)   Subjective:    Patient ID: Carrie Burke, female    DOB: 18-Jul-1990, 25 y.o.   MRN: [id number]  HPI: Carrie Burke is a 25 y.o. female   Chief Complaint  Patient presents with  . Anxiety  . Irritability  . Arm Pain    Plate put in arm 3 years ago and it popped last Sunday.    She used to be on Abilify and that was stopped when she get pregnant; Abilify is not going to be a good option; she was also on Seroquel in the past; had vivid nightmares She has lost weight; still trying to lose but plateau and can't lose anymore No thyroid disease in the family Really tired, little energy; can't sleep well at night; taking benadryl to help her fall asleep Mind races; not ADHD she has been told apparently [facility] takes care of her well woman exams Anxiety has been an issue for a while Cries and gets tearful  GAD 7 : Generalized Anxiety Score 02/27/2015  Nervous, Anxious, on Edge 3  Control/stop worrying 3  Worry too much - different things 3  Trouble relaxing 2  Restless 3  Easily annoyed or irritable 3  Afraid - awful might happen 3  Total GAD 7 Score 20  Anxiety Difficulty Extremely difficult    Depression screen PHQ 2/9 02/27/2015  Decreased Interest 3  Down, Depressed, Hopeless 2  PHQ - 2 Score 5  Altered sleeping 2  Tired, decreased energy 2  Change in appetite 0  Feeling bad or failure about yourself  3  Trouble concentrating 3  Moving slowly or fidgety/restless 2  Suicidal thoughts 0  PHQ-9 Score 17  Difficult doing work/chores Somewhat difficult   No fevers; does not want flu shot  She had a plate put in a few years ago; then two Sundays ago, it popped in her sleep; it sounded like a gunshot and it hurts; she initially injured it 3-1/2 years ago when she slipped and fell at a pool, and weight  went on shoulder and she broke collar bone; she went to [facility], Dr. Carolee Rota ([place]?), ortho; did have numbness down the arm into the right hand 4th and 5th fingers; right-handed; rash appeared same day as the shoulder popped  Relevant past medical, surgical, family and social history reviewed and updated as indicated Past Medical History  Diagnosis Date  . Preeclampsia   . Gestational diabetes   . Vitamin D deficiency disease   . Bipolar disorder ([place])   . Elevated serum glutamic pyruvic transaminase (SGPT) level   . History of pre-eclampsia    Past Surgical History  Procedure Laterality Date  . Tubal ligation    . Clavicle surgery  2014   Family History  Problem Relation Age of Onset  . Arthritis Mother   . Heart disease Father   . Hyperlipidemia Father   . Hypertension Father   . Asthma Brother   Uncle had bipolar d/o  Allergies and medications reviewed and updated.  Review of Systems Per HPI unless specifically indicated above     Objective:    BP 128/81 mmHg  Pulse 79  Temp(Src) 99.1 F (37.3 C)  Ht 5\' 5"  (1.651 m)  Wt 171 lb 9.6 oz (77.837 kg)  BMI 28.56 kg/m2  SpO2 98%  LMP 02/01/2015 (Exact Date)  Wt Readings from Last 3 Encounters:  02/27/15 171 lb 9.6 oz (77.837 kg)  02/05/13 186 lb (84.369 kg)  05/19/14 176 lb (79.833 kg)    Physical Exam  Constitutional: She appears well-developed and well-nourished. No distress.  Eyes: EOM are normal. No scleral icterus.  Neck: No thyromegaly present.  Cardiovascular: Normal rate and regular rhythm.   Pulmonary/Chest: Effort normal and breath sounds normal.  Abdominal: She exhibits no distension.  Musculoskeletal: She exhibits no edema.       Right shoulder: She exhibits tenderness and deformity (status post surgical repair right clavicle, palpable plate).  Neurological: She is alert.  Skin: Skin is warm. Rash (erythematous; no involvement of intertriginous webs; no burrows) noted. No pallor.  Psychiatric: She has a  normal mood and affect.      Assessment & Plan:   Problem List Items Addressed This Visit      Other   Vitamin D deficiency disease    Check and supplement if needed      Relevant Orders   VITAMIN D 25 Hydroxy (Vit-D Deficiency, Fractures) (Completed)   Bipolar disorder ([place])    She doesn't want to use abilify or seroquel; I want to check labs prior to starting other meds; will talk with her about other options; will use trazodone to help with sleep, as sleep deprivation can exacerbate mood      Relevant Orders   Lipid Panel w/o Chol/HDL Ratio (Completed)   Elevated serum glutamic pyruvic transaminase (SGPT) level    recheck      Relevant Orders   Comprehensive metabolic panel (Completed)   Pain in right shoulder - Primary   Relevant Orders   DG Shoulder Right (Completed)   Thrombocytopenia ([place])    Recheck labs      Relevant Orders   CBC with Differential/Platelet (Completed)    Other Visit Diagnoses    Other fatigue        Relevant Orders    TSH (Completed)    Vitamin B12 (Completed)    Contact dermatitis           Follow up plan: Return in about 3 weeks (around 03/20/2015) for medication follow-up.  Meds ordered this encounter  Medications  . traZODone (DESYREL) 50 MG tablet    Sig: Take 1 tablet (50 mg total) by mouth at bedtime as needed for sleep.    Dispense:  30 tablet    Refill:  3  . triamcinolone cream (KENALOG) 0.5 %    Sig: Apply 1 application topically 3 (three) times daily. Too strong for face, groin, underarms, children    Dispense:  30 g    Refill:  0

## 2015-02-27 NOTE — Patient Instructions (Addendum)
I do recommend yearly flu shots; for individuals who don't want flu shots, try to practice excellent hand hygiene, and avoid nursing homes, day cares, and hospitals during peak flu season; taking additional vitamin C daily during flu/cold season may help boost your immune system too We'll get labs and xray today and call you tomorrow with results and new medicine plan Try the trazodone at bedtime to help you sleep

## 2015-02-27 NOTE — Assessment & Plan Note (Addendum)
She doesn't want to use abilify or seroquel; I want to check labs prior to starting other meds; will talk with her about other options; will use trazodone to help with sleep, as sleep deprivation can exacerbate mood

## 2015-02-27 NOTE — Assessment & Plan Note (Signed)
recheck

## 2015-02-28 ENCOUNTER — Telehealth: Payer: Self-pay

## 2015-02-28 LAB — COMPREHENSIVE METABOLIC PANEL
ALK PHOS: 76 IU/L (ref 39–117)
ALT: 17 IU/L (ref 0–32)
AST: 18 IU/L (ref 0–40)
Albumin/Globulin Ratio: 1.8 (ref 1.1–2.5)
Albumin: 4.5 g/dL (ref 3.5–5.5)
BUN/Creatinine Ratio: 13 (ref 8–20)
BUN: 9 mg/dL (ref 6–20)
Bilirubin Total: <0.2 mg/dL (ref 0.0–1.2)
CALCIUM: 9.4 mg/dL (ref 8.7–10.2)
CO2: 24 mmol/L (ref 18–29)
Chloride: 102 mmol/L (ref 96–106)
Creatinine, Ser: 0.67 mg/dL (ref 0.57–1.00)
GFR calc Af Amer: 139 mL/min/{1.73_m2} (ref >59)
GFR calc non Af Amer: 121 mL/min/{1.73_m2} (ref >59)
GLUCOSE: 95 mg/dL (ref 65–99)
Globulin, Total: 2.5 g/dL (ref 1.5–4.5)
Potassium: 4.4 mmol/L (ref 3.5–5.2)
Sodium: 141 mmol/L (ref 134–144)
Total Protein: 7 g/dL (ref 6.0–8.5)

## 2015-02-28 LAB — CBC WITH DIFFERENTIAL/PLATELET
Basophils Absolute: 0 10*3/uL (ref 0.0–0.2)
Basos: 0 %
EOS (ABSOLUTE): 0.3 10*3/uL (ref 0.0–0.4)
Eos: 3 %
HEMOGLOBIN: 14.4 g/dL (ref 11.1–15.9)
Hematocrit: 43 % (ref 34.0–46.6)
IMMATURE GRANULOCYTES: 0 %
Immature Grans (Abs): 0 10*3/uL (ref 0.0–0.1)
LYMPHS ABS: 3.2 10*3/uL — AB (ref 0.7–3.1)
Lymphs: 32 %
MCH: 32.6 pg (ref 26.6–33.0)
MCHC: 33.5 g/dL (ref 31.5–35.7)
MCV: 97 fL (ref 79–97)
MONOS ABS: 0.7 10*3/uL (ref 0.1–0.9)
Monocytes: 7 %
NEUTROS PCT: 58 %
Neutrophils Absolute: 5.8 10*3/uL (ref 1.4–7.0)
Platelets: 208 10*3/uL (ref 150–379)
RBC: 4.42 x10E6/uL (ref 3.77–5.28)
RDW: 13.7 % (ref 12.3–15.4)
WBC: 10 10*3/uL (ref 3.4–10.8)

## 2015-02-28 LAB — LIPID PANEL W/O CHOL/HDL RATIO
Cholesterol, Total: 204 mg/dL — ABNORMAL HIGH (ref 100–199)
HDL: 45 mg/dL (ref >39)
LDL Calculated: 102 mg/dL — ABNORMAL HIGH (ref 0–99)
Triglycerides: 283 mg/dL — ABNORMAL HIGH (ref 0–149)
VLDL CHOLESTEROL CAL: 57 mg/dL — AB (ref 5–40)

## 2015-02-28 LAB — VITAMIN D 25 HYDROXY (VIT D DEFICIENCY, FRACTURES): Vit D, 25-Hydroxy: 13.2 ng/mL — ABNORMAL LOW (ref 30.0–100.0)

## 2015-02-28 LAB — VITAMIN B12: VITAMIN B 12: 403 pg/mL (ref 211–946)

## 2015-02-28 LAB — TSH: TSH: 1.57 u[IU]/mL (ref 0.450–4.500)

## 2015-02-28 MED ORDER — FLUOXETINE HCL 20 MG PO TABS: 20.0000 mg | ORAL_TABLET | Freq: Every day | ORAL | Status: DC

## 2015-02-28 MED ORDER — VITAMIN D (ERGOCALCIFEROL) 1.25 MG (50000 UNIT) PO CAPS: 50000.0000 [IU] | ORAL_CAPSULE | ORAL | Status: DC

## 2015-02-28 MED ORDER — OLANZAPINE 5 MG PO TABS: 5.0000 mg | ORAL_TABLET | Freq: Every day | ORAL | Status: DC

## 2015-02-28 NOTE — Telephone Encounter (Signed)
She has been on abilify and seroquel I explained lab results; start vit D New medicine for mood to pharmacy Stop it and call with any problems; keep f/u appt

## 2015-02-28 NOTE — Telephone Encounter (Signed)
Patient is calling to check the status of her labs and would like to discuss medication options with you.  Please call patient as soon as possible.

## 2015-03-11 DIAGNOSIS — D696 Thrombocytopenia, unspecified: Secondary | ICD-10-CM | POA: Insufficient documentation

## 2015-03-11 NOTE — Assessment & Plan Note (Signed)
Recheck labs 

## 2015-03-17 ENCOUNTER — Ambulatory Visit (INDEPENDENT_AMBULATORY_CARE_PROVIDER_SITE_OTHER): Payer: Managed Care, Other (non HMO) | Admitting: Family Medicine

## 2015-03-17 ENCOUNTER — Encounter: Payer: Self-pay | Admitting: Family Medicine

## 2015-03-17 VITALS — BP 127/78 | HR 75 | Temp 98.7°F | Wt 174.0 lb

## 2015-03-17 DIAGNOSIS — Z720 Tobacco use: Secondary | ICD-10-CM

## 2015-03-17 DIAGNOSIS — F3131 Bipolar disorder, current episode depressed, mild: Secondary | ICD-10-CM | POA: Diagnosis not present

## 2015-03-17 DIAGNOSIS — E559 Vitamin D deficiency, unspecified: Secondary | ICD-10-CM | POA: Diagnosis not present

## 2015-03-17 MED ORDER — FLUOXETINE HCL 20 MG PO TABS: 20.0000 mg | ORAL_TABLET | Freq: Every day | ORAL | Status: DC

## 2015-03-17 MED ORDER — OLANZAPINE 10 MG PO TABS: 10.0000 mg | ORAL_TABLET | Freq: Every day | ORAL | Status: DC

## 2015-03-17 NOTE — Patient Instructions (Signed)
I'll suggest cutting back on smoking and coffee and tea and chocolate Do take a multiple vitamin every day Increase the zyprexa from 5 mg daily to 10 mg daily Stay on the same dose of fluoxetine Return in 3 weeks, but call me sooner if needed

## 2015-03-17 NOTE — Progress Notes (Signed)
BP 127/78 mmHg  Pulse 75  Temp(Src) 98.7 F (37.1 C)  Wt 174 lb (78.926 kg)  SpO2 99%  LMP 02/28/2015 (Exact Date)   Subjective:    Patient ID: Carrie Burke, female    DOB: [birth year], 25 y.o.   MRN: [id number]  HPI: Carrie Burke is a 25 y.o. female  Chief Complaint  Patient presents with  . Bipolar    follow up on Trazadone   Patient is here with her husband for follow-up on bipolar disorder She has good days and bad days She is sleeping better on the trazodone More positive on the good days Agitated on the bad days, even little things piss her off No worse on the medicine though  Depression screen [facility] 2/9 03/17/2015 02/27/2015  Decreased Interest 2 3  Down, Depressed, Hopeless 2 2  PHQ - 2 Score 4 5  Altered sleeping 0 2  Tired, decreased energy 3 2  Change in appetite 2 0  Feeling bad or failure about yourself  3 3  Trouble concentrating 3 3  Moving slowly or fidgety/restless 2 2  Suicidal thoughts 0 0  PHQ-9 Score 17 17  Difficult doing work/chores Somewhat difficult Somewhat difficult   I offered referral to psychiatrist which was politely declined by patient  Vitamin D deficiency; taking Rx  Relevant past medical, social history reviewed and updated as indicated. Interim medical history since our last visit reviewed. Allergies and medications reviewed and updated.  Review of Systems Per HPI unless specifically indicated above     Objective:    BP 127/78 mmHg  Pulse 75  Temp(Src) 98.7 F (37.1 C)  Wt 174 lb (78.926 kg)  SpO2 99%  LMP 02/28/2015 (Exact Date)  Wt Readings from Last 3 Encounters:  03/17/15 174 lb (78.926 kg)  02/27/15 171 lb 9.6 oz (77.837 kg)  02/05/13 186 lb (84.369 kg)    Physical Exam  Constitutional: She appears well-developed and well-nourished. No distress.  Eyes: EOM are normal. No scleral icterus.  Neck: No thyromegaly present.  Cardiovascular: Normal rate and regular rhythm.   Pulmonary/Chest: Effort normal and  breath sounds normal.  Musculoskeletal: She exhibits no edema.       Right shoulder: She exhibits deformity.  Neurological: She is alert.  Skin: Skin is warm. No pallor.  Psychiatric: She has a normal mood and affect. Her mood appears not anxious. She is not agitated and not aggressive. She does not exhibit a depressed mood.  Good eye contact with examiner   Results for orders placed or performed in visit on 02/27/15  TSH  Result Value Ref Range   TSH 1.570 0.450 - 4.500 uIU/mL  VITAMIN D 25 Hydroxy (Vit-D Deficiency, Fractures)  Result Value Ref Range   Vit D, 25-Hydroxy 13.2 (L) 30.0 - 100.0 ng/mL  Lipid Panel w/o Chol/HDL Ratio  Result Value Ref Range   Cholesterol, Total 204 (H) 100 - 199 mg/dL   Triglycerides 283 (H) 0 - 149 mg/dL   HDL 45 >39 mg/dL   VLDL Cholesterol Cal 57 (H) 5 - 40 mg/dL   LDL Calculated 102 (H) 0 - 99 mg/dL  CBC with Differential/Platelet  Result Value Ref Range   WBC 10.0 3.4 - 10.8 x10E3/uL   RBC 4.42 3.77 - 5.28 x10E6/uL   Hemoglobin 14.4 11.1 - 15.9 g/dL   Hematocrit 43.0 34.0 - 46.6 %   MCV 97 79 - 97 fL   MCH 32.6 26.6 - 33.0 pg   MCHC 33.5 31.5 -  35.7 g/dL   RDW 13.7 12.3 - 15.4 %   Platelets 208 150 - 379 x10E3/uL   Neutrophils 58 %   Lymphs 32 %   Monocytes 7 %   Eos 3 %   Basos 0 %   Neutrophils Absolute 5.8 1.4 - 7.0 x10E3/uL   Lymphocytes Absolute 3.2 (H) 0.7 - 3.1 x10E3/uL   Monocytes Absolute 0.7 0.1 - 0.9 x10E3/uL   EOS (ABSOLUTE) 0.3 0.0 - 0.4 x10E3/uL   Basophils Absolute 0.0 0.0 - 0.2 x10E3/uL   Immature Granulocytes 0 %   Immature Grans (Abs) 0.0 0.0 - 0.1 x10E3/uL  Comprehensive metabolic panel  Result Value Ref Range   Glucose 95 65 - 99 mg/dL   BUN 9 6 - 20 mg/dL   Creatinine, Ser 0.67 0.57 - 1.00 mg/dL   GFR calc non Af Amer 121 >59 mL/min/1.73   GFR calc Af Amer 139 >59 mL/min/1.73   BUN/Creatinine Ratio 13 8 - 20   Sodium 141 134 - 144 mmol/L   Potassium 4.4 3.5 - 5.2 mmol/L   Chloride 102 96 - 106 mmol/L    CO2 24 18 - 29 mmol/L   Calcium 9.4 8.7 - 10.2 mg/dL   Total Protein 7.0 6.0 - 8.5 g/dL   Albumin 4.5 3.5 - 5.5 g/dL   Globulin, Total 2.5 1.5 - 4.5 g/dL   Albumin/Globulin Ratio 1.8 1.1 - 2.5   Bilirubin Total <0.2 0.0 - 1.2 mg/dL   Alkaline Phosphatase 76 39 - 117 IU/L   AST 18 0 - 40 IU/L   ALT 17 0 - 32 IU/L  Vitamin B12  Result Value Ref Range   Vitamin B-12 403 211 - 946 pg/mL      Assessment & Plan:   Problem List Items Addressed This Visit      Other   Vitamin D deficiency disease    Supplementation; getting level back up above 30 may help mood and energy      Bipolar disorder ([place]) - Primary    Increase zyprexa; continue same dose of SSRI; return in 3 weeks; cautions given, reasons to seek medical care reviewed; pt agrees; she politely declined offer for psych referral today      Tobacco abuse    Patient does not sound ready to quit, but I encouraged her to cut back on smoking, as well as caffeine, stimulants         Follow up plan: Return in about 3 weeks (around 04/07/2015) for medication follow-up.  Meds ordered this encounter  Medications  . OLANZapine (ZYPREXA) 10 MG tablet    Sig: Take 1 tablet (10 mg total) by mouth at bedtime.    Dispense:  30 tablet    Refill:  0  . FLUoxetine (PROZAC) 20 MG tablet    Sig: Take 1 tablet (20 mg total) by mouth daily.    Dispense:  30 tablet    Refill:  0   An after-visit summary was printed and given to the patient at [place].  Please see the patient instructions which may contain other information and recommendations beyond what is mentioned above in the assessment and plan.

## 2015-03-22 DIAGNOSIS — F17200 Nicotine dependence, unspecified, uncomplicated: Secondary | ICD-10-CM | POA: Insufficient documentation

## 2015-03-22 DIAGNOSIS — Z720 Tobacco use: Secondary | ICD-10-CM

## 2015-03-22 NOTE — Assessment & Plan Note (Signed)
Patient does not sound ready to quit, but I encouraged her to cut back on smoking, as well as caffeine, stimulants

## 2015-03-22 NOTE — Assessment & Plan Note (Signed)
Increase zyprexa; continue same dose of SSRI; return in 3 weeks; cautions given, reasons to seek medical care reviewed; pt agrees; she politely declined offer for psych referral today

## 2015-03-22 NOTE — Assessment & Plan Note (Signed)
Supplementation; getting level back up above 30 may help mood and energy

## 2015-04-07 ENCOUNTER — Ambulatory Visit (INDEPENDENT_AMBULATORY_CARE_PROVIDER_SITE_OTHER): Payer: Managed Care, Other (non HMO) | Admitting: Family Medicine

## 2015-04-07 ENCOUNTER — Encounter: Payer: Self-pay | Admitting: Family Medicine

## 2015-04-07 VITALS — BP 133/80 | HR 77 | Temp 98.4°F | Ht 65.0 in | Wt 175.0 lb

## 2015-04-07 DIAGNOSIS — Z720 Tobacco use: Secondary | ICD-10-CM

## 2015-04-07 DIAGNOSIS — G4700 Insomnia, unspecified: Secondary | ICD-10-CM | POA: Diagnosis not present

## 2015-04-07 DIAGNOSIS — E559 Vitamin D deficiency, unspecified: Secondary | ICD-10-CM | POA: Diagnosis not present

## 2015-04-07 DIAGNOSIS — F3131 Bipolar disorder, current episode depressed, mild: Secondary | ICD-10-CM | POA: Diagnosis not present

## 2015-04-07 MED ORDER — OLANZAPINE 15 MG PO TABS: 15.0000 mg | ORAL_TABLET | Freq: Every day | ORAL | Status: DC

## 2015-04-07 MED ORDER — VITAMIN D (ERGOCALCIFEROL) 1.25 MG (50000 UNIT) PO CAPS: 50000.0000 [IU] | ORAL_CAPSULE | ORAL | Status: DC

## 2015-04-07 MED ORDER — FLUOXETINE HCL 40 MG PO CAPS: 40.0000 mg | ORAL_CAPSULE | Freq: Every day | ORAL | Status: DC

## 2015-04-07 MED ORDER — TRAZODONE HCL 100 MG PO TABS: 100.0000 mg | ORAL_TABLET | Freq: Every day | ORAL | Status: DC

## 2015-04-07 NOTE — Patient Instructions (Addendum)
Consider having your son evaluated Consider switching to decaffeinated tea after 2 pm Increase your medicines as noted in med list, and I've sent new prescriptions to your pharmacy Call with any problems or concerns I'll be moving to another practice, [facility], on April 17, 2015 I'll be here until April 14, 2015

## 2015-04-07 NOTE — Progress Notes (Signed)
BP 133/80 mmHg  Pulse 77  Temp(Src) 98.4 F (36.9 C)  Ht 5\' 5"  (1.651 m)  Wt 175 lb (79.379 kg)  BMI 29.12 kg/m2  SpO2 99%  LMP 03/27/2015   Subjective:    Patient ID: Carrie Burke, female    DOB: 1990-05-25, 25 y.o.   MRN: [id number]  HPI: Carrie Burke is a 25 y.o. female  Chief Complaint  Patient presents with  . Bipolar    follow up, increased Zyprexa at the last appointment. She's noticed a slight improvement with the increase.    Depression screen [facility] 2/9 04/07/2015 03/17/2015 02/27/2015  Decreased Interest 2 2 3   Down, Depressed, Hopeless 2 2 2   PHQ - 2 Score 4 4 5   Altered sleeping 2 0 2  Tired, decreased energy 2 3 2   Change in appetite 0 2 0  Feeling bad or failure about yourself  3 3 3   Trouble concentrating 3 3 3   Moving slowly or fidgety/restless 0 2 2  Suicidal thoughts 0 0 0  PHQ-9 Score 14 17 17   Difficult doing work/chores Somewhat difficult Somewhat difficult Somewhat difficult     GAD 7 : Generalized Anxiety Score 04/07/2015 03/17/2015 02/27/2015  Nervous, Anxious, on Edge 3 2 3   Control/stop worrying 2 2 3   Worry too much - different things 2 3 3   Trouble relaxing 3 2 2   Restless 3 2 3   Easily annoyed or irritable 3 3 3   Afraid - awful might happen 2 2 3   Total GAD 7 Score 18 16 20   Anxiety Difficulty Somewhat difficult Somewhat difficult Extremely difficult   Here for follow-up of bipolar depression, anxiety and insomnia Sometimes she wakes up and knows it is going to be a bad day Her 27 year old doesn't listen to a word she says; she thinks he might have ADHD and may take him to his pediatrician to get checked out She still gets down and depressed as well; no bad side effects from the medicine; not sleepy, not gaining weight; no SI She does still wake up through the night, pretty regular; does drink sweet tea late; the sleepin is in fact better on the trazodone, but not lasting through the night No symptoms/signs of mania or hypomania She has  vitamin D deficiency; she took one month, but did not get the refill She continues to smoke and isn't ready to quit just yet  Relevant past medical, surgical, family and social history reviewed and updated as indicated Past Medical History  Diagnosis Date  . Preeclampsia   . Gestational diabetes   . Vitamin D deficiency disease   . Bipolar disorder ([place])   . Elevated serum glutamic pyruvic transaminase (SGPT) level   . History of pre-eclampsia   elevated SGPT has resolved; elevated platelets have also resolved; see labs below  Past Surgical History  Procedure Laterality Date  . Tubal ligation    . Clavicle surgery  2014   Family History  Problem Relation Age of Onset  . Arthritis Mother   . Heart disease Father   . Hyperlipidemia Father   . Hypertension Father   . Asthma Brother    Interim medical history since our last visit reviewed. Allergies and medications reviewed and updated.  Review of Systems  Per HPI unless specifically indicated above     Objective:    BP 133/80 mmHg  Pulse 77  Temp(Src) 98.4 F (36.9 C)  Ht 5\' 5"  (1.651 m)  Wt 175 lb (79.379 kg)  BMI 29.12 kg/m2  SpO2 99%  LMP 03/27/2015  Wt Readings from Last 3 Encounters:  04/07/15 175 lb (79.379 kg)  03/17/15 174 lb (78.926 kg)  02/27/15 171 lb 9.6 oz (77.837 kg)    Physical Exam  Constitutional: She appears well-developed and well-nourished. No distress.  Eyes: EOM are normal. No scleral icterus.  Neck: No thyromegaly present.  Cardiovascular: Normal rate and regular rhythm.   Pulmonary/Chest: Effort normal and breath sounds normal.  Musculoskeletal: She exhibits no edema.  Neurological: She is alert. She displays no tremor.  No tics  Skin: Skin is warm. She is not diaphoretic. No pallor.  Psychiatric: She has a normal mood and affect. Her speech is normal and behavior is normal. Judgment and thought content normal. Her mood appears not anxious. She is not agitated and not aggressive.  Cognition and memory are normal. She does not exhibit a depressed mood. She expresses no suicidal ideation.  Good eye contact with examiner; pleasant, conversant   Results for orders placed or performed in visit on 02/27/15  TSH  Result Value Ref Range   TSH 1.570 0.450 - 4.500 uIU/mL  VITAMIN D 25 Hydroxy (Vit-D Deficiency, Fractures)  Result Value Ref Range   Vit D, 25-Hydroxy 13.2 (L) 30.0 - 100.0 ng/mL  Lipid Panel w/o Chol/HDL Ratio  Result Value Ref Range   Cholesterol, Total 204 (H) 100 - 199 mg/dL   Triglycerides 283 (H) 0 - 149 mg/dL   HDL 45 >39 mg/dL   VLDL Cholesterol Cal 57 (H) 5 - 40 mg/dL   LDL Calculated 102 (H) 0 - 99 mg/dL  CBC with Differential/Platelet  Result Value Ref Range   WBC 10.0 3.4 - 10.8 x10E3/uL   RBC 4.42 3.77 - 5.28 x10E6/uL   Hemoglobin 14.4 11.1 - 15.9 g/dL   Hematocrit 43.0 34.0 - 46.6 %   MCV 97 79 - 97 fL   MCH 32.6 26.6 - 33.0 pg   MCHC 33.5 31.5 - 35.7 g/dL   RDW 13.7 12.3 - 15.4 %   Platelets 208 150 - 379 x10E3/uL   Neutrophils 58 %   Lymphs 32 %   Monocytes 7 %   Eos 3 %   Basos 0 %   Neutrophils Absolute 5.8 1.4 - 7.0 x10E3/uL   Lymphocytes Absolute 3.2 (H) 0.7 - 3.1 x10E3/uL   Monocytes Absolute 0.7 0.1 - 0.9 x10E3/uL   EOS (ABSOLUTE) 0.3 0.0 - 0.4 x10E3/uL   Basophils Absolute 0.0 0.0 - 0.2 x10E3/uL   Immature Granulocytes 0 %   Immature Grans (Abs) 0.0 0.0 - 0.1 x10E3/uL  Comprehensive metabolic panel  Result Value Ref Range   Glucose 95 65 - 99 mg/dL   BUN 9 6 - 20 mg/dL   Creatinine, Ser 0.67 0.57 - 1.00 mg/dL   GFR calc non Af Amer 121 >59 mL/min/1.73   GFR calc Af Amer 139 >59 mL/min/1.73   BUN/Creatinine Ratio 13 8 - 20   Sodium 141 134 - 144 mmol/L   Potassium 4.4 3.5 - 5.2 mmol/L   Chloride 102 96 - 106 mmol/L   CO2 24 18 - 29 mmol/L   Calcium 9.4 8.7 - 10.2 mg/dL   Total Protein 7.0 6.0 - 8.5 g/dL   Albumin 4.5 3.5 - 5.5 g/dL   Globulin, Total 2.5 1.5 - 4.5 g/dL   Albumin/Globulin Ratio 1.8 1.1 - 2.5    Bilirubin Total <0.2 0.0 - 1.2 mg/dL   Alkaline Phosphatase 76 39 - 117 IU/L  AST 18 0 - 40 IU/L   ALT 17 0 - 32 IU/L  Vitamin B12  Result Value Ref Range   Vitamin B-12 403 211 - 946 pg/mL      Assessment & Plan:   Problem List Items Addressed This Visit      Other   Vitamin D deficiency disease    Start back on vitamin D Rx      Bipolar disorder ([place]) - Primary    Improving with medicines, but not to goal yet; GAD-7 and PHQ-9 scores reviewed with patient; increase medicines; call if any hypomania or mania before next f/u; for stress levels, suggested she do get her son in to get evaluated by his pediatrician; offered again to have her see psychiatrist, but she politely declined; close f/u; see AVS      Tobacco abuse    She is not ready to quit smoking at this time; I am here to help if/when she is ready      Insomnia    Advised cutting out caffeinated beverages, sweet tea after 2 pm; will increase trazodone; reassess at f/u         An after-visit summary was printed and given to the patient at [place].  Please see the patient instructions which may contain other information and recommendations beyond what is mentioned above in the assessment and plan.  Face-to-face time with patient was more than 25 minutes, >50% time spent counseling and coordination of care  Follow up plan: Return in about 4 weeks (around 05/05/2015) for follow-up.  Meds ordered this encounter  Medications  . traZODone (DESYREL) 100 MG tablet    Sig: Take 1 tablet (100 mg total) by mouth at bedtime.    Dispense:  30 tablet    Refill:  3  . OLANZapine (ZYPREXA) 15 MG tablet    Sig: Take 1 tablet (15 mg total) by mouth at bedtime.    Dispense:  30 tablet    Refill:  0  . FLUoxetine (PROZAC) 40 MG capsule    Sig: Take 1 capsule (40 mg total) by mouth daily.    Dispense:  30 capsule    Refill:  0  . Vitamin D, Ergocalciferol, (DRISDOL) 50000 units CAPS capsule    Sig: Take 1 capsule (50,000  Units total) by mouth every 7 (seven) days.    Dispense:  4 capsule    Refill:  0

## 2015-04-09 DIAGNOSIS — G4700 Insomnia, unspecified: Secondary | ICD-10-CM | POA: Insufficient documentation

## 2015-04-09 NOTE — Assessment & Plan Note (Signed)
Improving with medicines, but not to goal yet; GAD-7 and PHQ-9 scores reviewed with patient; increase medicines; call if any hypomania or mania before next f/u; for stress levels, suggested she do get her son in to get evaluated by his pediatrician; offered again to have her see psychiatrist, but she politely declined; close f/u; see AVS

## 2015-04-09 NOTE — Assessment & Plan Note (Signed)
She is not ready to quit smoking at this time; I am here to help if/when she is ready

## 2015-04-09 NOTE — Assessment & Plan Note (Signed)
Start back on vitamin D Rx

## 2015-04-09 NOTE — Assessment & Plan Note (Signed)
Advised cutting out caffeinated beverages, sweet tea after 2 pm; will increase trazodone; reassess at f/u

## 2015-04-17 ENCOUNTER — Ambulatory Visit: Payer: Managed Care, Other (non HMO) | Admitting: Family Medicine

## 2015-04-19 ENCOUNTER — Other Ambulatory Visit: Payer: Self-pay | Admitting: Family Medicine

## 2015-04-19 NOTE — Telephone Encounter (Signed)
Pharmacy requested 10 mg refills; I increased the dose from 10 mg to 15 mg last visit; I'm denying this request

## 2015-04-20 ENCOUNTER — Other Ambulatory Visit: Payer: Self-pay | Admitting: Family Medicine

## 2015-04-20 MED ORDER — FLUOXETINE HCL 40 MG PO CAPS: 40.0000 mg | ORAL_CAPSULE | Freq: Every day | ORAL | Status: DC

## 2015-04-20 NOTE — Telephone Encounter (Signed)
Pharmacy requested 20 mg; it was upped to 40 mg last visit; I'll resend

## 2015-07-19 ENCOUNTER — Ambulatory Visit (INDEPENDENT_AMBULATORY_CARE_PROVIDER_SITE_OTHER): Payer: Managed Care, Other (non HMO) | Admitting: Family Medicine

## 2015-07-19 ENCOUNTER — Encounter: Payer: Self-pay | Admitting: Family Medicine

## 2015-07-19 VITALS — BP 119/81 | HR 74 | Temp 98.4°F | Wt 168.0 lb

## 2015-07-19 DIAGNOSIS — L989 Disorder of the skin and subcutaneous tissue, unspecified: Secondary | ICD-10-CM | POA: Diagnosis not present

## 2015-07-19 DIAGNOSIS — M542 Cervicalgia: Secondary | ICD-10-CM | POA: Diagnosis not present

## 2015-07-19 MED ORDER — CLOBETASOL PROPIONATE 0.05 % EX CREA: 1.0000 "application " | TOPICAL_CREAM | Freq: Two times a day (BID) | CUTANEOUS | Status: DC

## 2015-07-19 MED ORDER — PREDNISONE 20 MG PO TABS: 40.0000 mg | ORAL_TABLET | Freq: Every day | ORAL | Status: DC

## 2015-07-19 MED ORDER — CYCLOBENZAPRINE HCL 5 MG PO TABS: 5.0000 mg | ORAL_TABLET | Freq: Three times a day (TID) | ORAL | Status: DC | PRN

## 2015-07-19 NOTE — Progress Notes (Signed)
BP 119/81 mmHg  Pulse 74  Temp(Src) 98.4 F (36.9 C)  Wt 168 lb (76.204 kg)  SpO2 99%  LMP 07/16/2015 (Exact Date)   Subjective:    Patient ID: Carrie Burke, female    DOB: [birth year], 25 y.o.   MRN: [id number]  HPI: Carrie Burke is a 25 y.o. female  Chief Complaint  Patient presents with  . Neck Pain    since last night. and hurting down into her left arm and some numbness and tingling in hand. She has been taking Ibuprofen for it today. No known injury.  . Skin Lesion    spot on her left foot, she just noticed it yesterday. Doesn't itch.   Patient presents with 1 day history of neck pain. First noticed when it woke her out of sleep. States it radiates down left arm and there is numbness and tingling in left hand. Pain is worst right over spine in between shoulder blades. Denies any known injury or history of neck problems. Denies fever, chills, bowel/bladder incontinence, or low back pain. Taking 800 ibuprofen with no relief.   Also c/o one day history of rash on medial aspect of left foot. Red, raised ring that is not itchy or scaly. Has never had a place like that before. Has not tried any creams on it  Relevant past medical, surgical, family and social history reviewed and updated as indicated. Interim medical history since our last visit reviewed. Allergies and medications reviewed and updated.  Review of Systems  Constitutional: Negative.   HENT: Negative.   Eyes: Negative.   Respiratory: Negative.   Cardiovascular: Negative.   Gastrointestinal: Negative.   Genitourinary: Negative.   Musculoskeletal: Positive for back pain, neck pain and neck stiffness.  Skin: Positive for rash (left foot).  Neurological: Positive for numbness. Negative for weakness.  Psychiatric/Behavioral: Negative.     Per HPI unless specifically indicated above     Objective:    BP 119/81 mmHg  Pulse 74  Temp(Src) 98.4 F (36.9 C)  Wt 168 lb (76.204 kg)  SpO2 99%  LMP 07/16/2015  (Exact Date)  Wt Readings from Last 3 Encounters:  07/19/15 168 lb (76.204 kg)  04/07/15 175 lb (79.379 kg)  03/17/15 174 lb (78.926 kg)    Physical Exam  Constitutional: She is oriented to person, place, and time. She appears well-developed and well-nourished. No distress.  HENT:  Head: Atraumatic.  Mouth/Throat: Oropharynx is clear and moist. No oropharyngeal exudate.  Eyes: Conjunctivae are normal. Pupils are equal, round, and reactive to light. No scleral icterus.  Neck: Neck supple.  ROM decreased with left lateral rotation, limited due to patient discomfort Full ROM intact otherwise  Cardiovascular: Normal rate, regular rhythm and normal heart sounds.   Pulmonary/Chest: Effort normal and breath sounds normal.  Abdominal: Soft.  Musculoskeletal: Normal range of motion. She exhibits tenderness (TTP over full cervical and thoracic spine).  Left trapezius and deltoid TTP without spasm  Lymphadenopathy:    She has no cervical adenopathy.  Neurological: She is alert and oriented to person, place, and time.  Skin: Skin is warm and dry. Rash (2 cm raised annular lesion with central clearing on medial left foot. No scaling) noted.  Psychiatric: She has a normal mood and affect. Her behavior is normal.  Nursing note and vitals reviewed.   Results for orders placed or performed in visit on 02/27/15  TSH  Result Value Ref Range   TSH 1.570 0.450 - 4.500 uIU/mL  VITAMIN D  25 Hydroxy (Vit-D Deficiency, Fractures)  Result Value Ref Range   Vit D, 25-Hydroxy 13.2 (L) 30.0 - 100.0 ng/mL  Lipid Panel w/o Chol/HDL Ratio  Result Value Ref Range   Cholesterol, Total 204 (H) 100 - 199 mg/dL   Triglycerides 283 (H) 0 - 149 mg/dL   HDL 45 >39 mg/dL   VLDL Cholesterol Cal 57 (H) 5 - 40 mg/dL   LDL Calculated 102 (H) 0 - 99 mg/dL  CBC with Differential/Platelet  Result Value Ref Range   WBC 10.0 3.4 - 10.8 x10E3/uL   RBC 4.42 3.77 - 5.28 x10E6/uL   Hemoglobin 14.4 11.1 - 15.9 g/dL    Hematocrit 43.0 34.0 - 46.6 %   MCV 97 79 - 97 fL   MCH 32.6 26.6 - 33.0 pg   MCHC 33.5 31.5 - 35.7 g/dL   RDW 13.7 12.3 - 15.4 %   Platelets 208 150 - 379 x10E3/uL   Neutrophils 58 %   Lymphs 32 %   Monocytes 7 %   Eos 3 %   Basos 0 %   Neutrophils Absolute 5.8 1.4 - 7.0 x10E3/uL   Lymphocytes Absolute 3.2 (H) 0.7 - 3.1 x10E3/uL   Monocytes Absolute 0.7 0.1 - 0.9 x10E3/uL   EOS (ABSOLUTE) 0.3 0.0 - 0.4 x10E3/uL   Basophils Absolute 0.0 0.0 - 0.2 x10E3/uL   Immature Granulocytes 0 %   Immature Grans (Abs) 0.0 0.0 - 0.1 x10E3/uL  Comprehensive metabolic panel  Result Value Ref Range   Glucose 95 65 - 99 mg/dL   BUN 9 6 - 20 mg/dL   Creatinine, Ser 0.67 0.57 - 1.00 mg/dL   GFR calc non Af Amer 121 >59 mL/min/1.73   GFR calc Af Amer 139 >59 mL/min/1.73   BUN/Creatinine Ratio 13 8 - 20   Sodium 141 134 - 144 mmol/L   Potassium 4.4 3.5 - 5.2 mmol/L   Chloride 102 96 - 106 mmol/L   CO2 24 18 - 29 mmol/L   Calcium 9.4 8.7 - 10.2 mg/dL   Total Protein 7.0 6.0 - 8.5 g/dL   Albumin 4.5 3.5 - 5.5 g/dL   Globulin, Total 2.5 1.5 - 4.5 g/dL   Albumin/Globulin Ratio 1.8 1.1 - 2.5   Bilirubin Total <0.2 0.0 - 1.2 mg/dL   Alkaline Phosphatase 76 39 - 117 IU/L   AST 18 0 - 40 IU/L   ALT 17 0 - 32 IU/L  Vitamin B12  Result Value Ref Range   Vitamin B-12 403 211 - 946 pg/mL      Assessment & Plan:   Problem List Items Addressed This Visit    None    Visit Diagnoses    Neck pain    -  Primary    C and T spine x-rays ordered given spinal tenderness, but suspect issue is more muscular in origin and resulting in straining her neck in her sleep.     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 Clobetasol given, patient instructed to use for 1-2 weeks only on affected area      Flexeril and prednisone given, risks and cautions discussed with patient. She is agreeable to plan and  will follow up if needed. Will await spine x-rays.   Follow up plan: No Follow-up on file.

## 2015-07-19 NOTE — Patient Instructions (Signed)
Follow up in a few days if no improvement

## 2016-08-16 ENCOUNTER — Encounter: Payer: Self-pay | Admitting: Family Medicine

## 2016-08-16 ENCOUNTER — Ambulatory Visit (INDEPENDENT_AMBULATORY_CARE_PROVIDER_SITE_OTHER): Payer: 59 | Admitting: Family Medicine

## 2016-08-16 VITALS — BP 128/84 | HR 85 | Temp 98.7°F | Wt 175.0 lb

## 2016-08-16 DIAGNOSIS — N63 Unspecified lump in unspecified breast: Secondary | ICD-10-CM

## 2016-08-16 DIAGNOSIS — R350 Frequency of micturition: Secondary | ICD-10-CM | POA: Diagnosis not present

## 2016-08-16 NOTE — Progress Notes (Signed)
BP 128/84   Pulse 85   Temp 98.7 F (37.1 C)   Wt 175 lb (79.4 kg)   LMP 08/02/2016   SpO2 100%   BMI 29.12 kg/m    Subjective:    Patient ID: Carrie Burke, female    DOB: [birth year], 26 y.o.   MRN: [id number]  HPI: Carrie Burke is a 26 y.o. female  Chief Complaint  Patient presents with  . Breast Pain    both breast have been hurting for a month. felt a left breast lump last night that is painful. No family hx of breast cancer.   Patient presents with about 1 month of b/l breast soreness and lighter pigmentation of nipples. States her husband found a golf ball sized breast mass on the lateral aspect of her left breast last night and she wanted to get things checked out. LMP was 08/02/2016, Hx of tubal ligation, negative urine pregnancy at home. No fhx of breast cancer, nipple discharge, redness or swelling, fevers, chills.   Relevant past medical, surgical, family and social history reviewed and updated as indicated. Interim medical history since our last visit reviewed. Allergies and medications reviewed and updated.  Review of Systems  Constitutional: Negative.   HENT: Negative.   Respiratory: Negative.   Cardiovascular: Negative.   Gastrointestinal: Negative.   Genitourinary: Negative.   Musculoskeletal:       Breast tenderness and mass of left breast  Neurological: Negative.   Psychiatric/Behavioral: Negative.    Per HPI unless specifically indicated above     Objective:    BP 128/84   Pulse 85   Temp 98.7 F (37.1 C)   Wt 175 lb (79.4 kg)   LMP 08/02/2016   SpO2 100%   BMI 29.12 kg/m   Wt Readings from Last 3 Encounters:  08/16/16 175 lb (79.4 kg)  07/19/15 168 lb (76.2 kg)  04/07/15 175 lb (79.4 kg)    Physical Exam  Constitutional: She is oriented to person, place, and time. She appears well-developed and well-nourished. No distress.  HENT:  Head: Atraumatic.  Eyes: Pupils are equal, round, and reactive to light. Conjunctivae are normal.    Neck: Normal range of motion. Neck supple.  Cardiovascular: Normal rate and normal heart sounds.   Pulmonary/Chest: Effort normal and breath sounds normal. No respiratory distress. Right breast exhibits tenderness (minimal). Right breast exhibits no mass, no nipple discharge and no skin change. Left breast exhibits tenderness (minimal). Left breast exhibits no mass, no nipple discharge and no skin change.  Musculoskeletal: Normal range of motion.  Neurological: She is alert and oriented to person, place, and time.  Skin: Skin is warm and dry.  Psychiatric: She has a normal mood and affect. Her behavior is normal.  Nursing note and vitals reviewed.     Assessment & Plan:   Problem List Items Addressed This Visit    None    Visit Diagnoses    Breast mass    -  Primary   Relevant Orders   hCG, serum, qualitative   Urinary frequency       U/a with 1+ leukocytes, microscopic exam WNL. Push fluids, probiotic, cranberry. Will treat if sxs worsening.    Relevant Orders   UA/M w/rflx Culture, Routine    Exam benign, reviewed technique for home exams and found that pt was squeezing entire breast together during exam so was feeling normal structures. Discussed what to feel for that would be cause for concern. Will continue to monitor  for changes. Will check beta HCG today to be safe despite tubal and negative home test. Follow up if no improvement   Follow up plan: Return if symptoms worsen or fail to improve.

## 2016-08-16 NOTE — Patient Instructions (Signed)
Follow up if no improvement 

## 2016-08-17 LAB — HCG, SERUM, QUALITATIVE: hCG,Beta Subunit,Qual,Serum: NEGATIVE m[IU]/mL (ref <6)

## 2016-08-18 LAB — URINE CULTURE, REFLEX

## 2016-08-18 LAB — UA/M W/RFLX CULTURE, ROUTINE
Bilirubin, UA: NEGATIVE
GLUCOSE, UA: NEGATIVE
Ketones, UA: NEGATIVE
NITRITE UA: NEGATIVE
PH UA: 6 (ref 5.0–7.5)
PROTEIN UA: NEGATIVE
RBC UA: NEGATIVE
Specific Gravity, UA: <1.005 — ABNORMAL LOW (ref 1.005–1.030)
UUROB: 0.2 mg/dL (ref 0.2–1.0)

## 2016-08-18 LAB — MICROSCOPIC EXAMINATION: RBC, UA: NONE SEEN /hpf (ref 0–?)

## 2016-08-23 ENCOUNTER — Encounter: Payer: Self-pay | Admitting: Family Medicine

## 2016-08-23 ENCOUNTER — Ambulatory Visit (INDEPENDENT_AMBULATORY_CARE_PROVIDER_SITE_OTHER): Payer: 59 | Admitting: Family Medicine

## 2016-08-23 VITALS — BP 114/77 | HR 87 | Temp 98.9°F | Wt 176.0 lb

## 2016-08-23 DIAGNOSIS — F3131 Bipolar disorder, current episode depressed, mild: Secondary | ICD-10-CM | POA: Diagnosis not present

## 2016-08-23 DIAGNOSIS — G4700 Insomnia, unspecified: Secondary | ICD-10-CM | POA: Diagnosis not present

## 2016-08-23 MED ORDER — FLUOXETINE HCL 20 MG PO TABS: 20.0000 mg | ORAL_TABLET | Freq: Every day | ORAL | 3 refills | Status: DC

## 2016-08-23 MED ORDER — OLANZAPINE 7.5 MG PO TABS: 7.5000 mg | ORAL_TABLET | Freq: Every day | ORAL | 0 refills | Status: DC

## 2016-08-23 MED ORDER — TRAZODONE HCL 50 MG PO TABS: 25.0000 mg | ORAL_TABLET | Freq: Every evening | ORAL | 0 refills | Status: DC | PRN

## 2016-08-23 NOTE — Progress Notes (Signed)
BP 114/77   Pulse 87   Temp 98.9 F (37.2 C)   Wt 176 lb (79.8 kg)   LMP 08/02/2016   SpO2 100%   BMI 29.29 kg/m    Subjective:    Patient ID: Carrie Burke, female    DOB: December 05, 1990, 26 y.o.   MRN: [id number]  HPI: Carrie Burke is a 26 y.o. female  Chief Complaint  Patient presents with  . Manic Behavior    Used to take Zyprexa and Prozac. Wanting to get back on something.  . Insomnia    Used to take Trazadone for sleep. Has been taking OTC sleep aids or benadryl.   Patient presents today for her insomnia and uncontrolled bipolar disorder. Has been off all her medications for quite some time now. Was previously under fairly good control with zyprexa, prozac, and trazodone. Abilify makes her jittery, seroquel caused vivid nightmares. Feels like she has been riding a roller coaster of emotions anymore and it is really affected her daily life again at this point. Denies SI/HI, hallucinations, self harm.   Past Medical History:  Diagnosis Date  . Bipolar disorder ([place])   . Elevated serum glutamic pyruvic transaminase (SGPT) level   . Gestational diabetes   . History of pre-eclampsia   . Preeclampsia   . Vitamin D deficiency disease    Social History   Social History  . Marital status: Married    Spouse name: N/A  . Number of children: N/A  . Years of education: N/A   Occupational History  . Not on file.   Social History Main Topics  . Smoking status: Current Every Day Smoker    Packs/day: 1.00  . Smokeless tobacco: Never Used  . Alcohol use Yes     Comment: Occasionally  . Drug use: No  . Sexual activity: Not on file   Other Topics Concern  . Not on file   Social History Narrative  . No narrative on file    Relevant past medical, surgical, family and social history reviewed and updated as indicated. Interim medical history since our last visit reviewed. Allergies and medications reviewed and updated.  Review of Systems  Constitutional: Positive for  fatigue.  HENT: Negative.   Respiratory: Negative.   Cardiovascular: Negative.   Gastrointestinal: Negative.   Musculoskeletal: Negative.   Psychiatric/Behavioral: Positive for dysphoric mood and sleep disturbance. The patient is nervous/anxious and is hyperactive.    Per HPI unless specifically indicated above     Objective:    BP 114/77   Pulse 87   Temp 98.9 F (37.2 C)   Wt 176 lb (79.8 kg)   LMP 08/02/2016   SpO2 100%   BMI 29.29 kg/m   Wt Readings from Last 3 Encounters:  08/23/16 176 lb (79.8 kg)  08/16/16 175 lb (79.4 kg)  07/19/15 168 lb (76.2 kg)    Physical Exam  Constitutional: She is oriented to person, place, and time. She appears well-developed and well-nourished. No distress.  HENT:  Head: Atraumatic.  Eyes: Pupils are equal, round, and reactive to light. Conjunctivae are normal.  Neck: Normal range of motion. Neck supple.  Cardiovascular: Normal rate and normal heart sounds.   Pulmonary/Chest: Effort normal. No respiratory distress.  Musculoskeletal: Normal range of motion.  Neurological: She is alert and oriented to person, place, and time.  Skin: Skin is warm and dry.  Psychiatric: She has a normal mood and affect. Her behavior is normal. Thought content normal.  Nursing note  and vitals reviewed.     Assessment & Plan:   Problem List Items Addressed This Visit      Other   Bipolar disorder ([place])    Will restart prozac and zyprexa at half the previous dose, titrate up as tolerated. Return precautions reviewed      Insomnia - Primary    Restart trazodone, melatonin prn. Sleep hygiene reviewed.           Follow up plan: Return in about 4 weeks (around 09/20/2016) for Bipolar f/u.

## 2016-08-26 NOTE — Assessment & Plan Note (Signed)
Will restart prozac and zyprexa at half the previous dose, titrate up as tolerated. Return precautions reviewed

## 2016-08-26 NOTE — Patient Instructions (Signed)
Follow up in 1 month for recheck.

## 2016-08-26 NOTE — Assessment & Plan Note (Signed)
Restart trazodone, melatonin prn. Sleep hygiene reviewed.

## 2016-09-19 ENCOUNTER — Other Ambulatory Visit: Payer: Self-pay | Admitting: Family Medicine

## 2016-09-25 ENCOUNTER — Ambulatory Visit (INDEPENDENT_AMBULATORY_CARE_PROVIDER_SITE_OTHER): Payer: 59 | Admitting: Family Medicine

## 2016-09-25 ENCOUNTER — Encounter: Payer: Self-pay | Admitting: Family Medicine

## 2016-09-25 VITALS — BP 133/83 | HR 89 | Temp 98.3°F | Ht 65.5 in | Wt 182.4 lb

## 2016-09-25 DIAGNOSIS — G4700 Insomnia, unspecified: Secondary | ICD-10-CM | POA: Diagnosis not present

## 2016-09-25 DIAGNOSIS — Z23 Encounter for immunization: Secondary | ICD-10-CM

## 2016-09-25 DIAGNOSIS — F3131 Bipolar disorder, current episode depressed, mild: Secondary | ICD-10-CM | POA: Diagnosis not present

## 2016-09-25 MED ORDER — FLUOXETINE HCL 40 MG PO CAPS: 40.0000 mg | ORAL_CAPSULE | Freq: Every day | ORAL | 0 refills | Status: DC

## 2016-09-25 MED ORDER — TRAZODONE HCL 50 MG PO TABS: 25.0000 mg | ORAL_TABLET | Freq: Every evening | ORAL | 3 refills | Status: DC | PRN

## 2016-09-25 MED ORDER — OLANZAPINE 10 MG PO TABS: 10.0000 mg | ORAL_TABLET | Freq: Every day | ORAL | 1 refills | Status: DC

## 2016-09-25 NOTE — Progress Notes (Signed)
BP 133/83   Pulse 89   Temp 98.3 F (36.8 C)   Ht 5' 5.5" (1.664 m)   Wt 182 lb 6.4 oz (82.7 kg)   LMP 09/09/2016 (Approximate)   SpO2 99%   BMI 29.89 kg/m    Subjective:    Patient ID: Carrie Burke, female    DOB: Sep 27, 1990, 26 y.o.   MRN: [id number]  HPI: Carrie Burke is a 26 y.o. female  Chief Complaint  Patient presents with  . Manic Behavior    4 week f/up    Patient presents today for 1 month f/u after restarting her medications for Bipolar Disorder and insomnia. Doing very well back on medications, even at half doses of prozac and zyprexa. Ran out of trazodone and zyprexa almost a week ago and has noticed a big difference back off of them. Denies side effects, taking faithfully when she has them.  Since being out of trazodone, has been taking 2 benadryls nightly which has not helped much.   Past Medical History:  Diagnosis Date  . Bipolar disorder ([place])   . Elevated serum glutamic pyruvic transaminase (SGPT) level   . Gestational diabetes   . History of pre-eclampsia   . Preeclampsia   . Vitamin D deficiency disease    Social History   Social History  . Marital status: Married    Spouse name: N/A  . Number of children: N/A  . Years of education: N/A   Occupational History  . Not on file.   Social History Main Topics  . Smoking status: Current Every Day Smoker    Packs/day: 1.00  . Smokeless tobacco: Never Used  . Alcohol use Yes     Comment: Occasionally  . Drug use: No  . Sexual activity: Not on file   Other Topics Concern  . Not on file   Social History Narrative  . No narrative on file   Relevant past medical, surgical, family and social history reviewed and updated as indicated. Interim medical history since our last visit reviewed. Allergies and medications reviewed and updated.  Review of Systems  Constitutional: Negative.   HENT: Negative.   Eyes: Negative.   Respiratory: Negative.   Cardiovascular: Negative.     Gastrointestinal: Negative.   Genitourinary: Negative.   Musculoskeletal: Negative.   Neurological: Negative.   Psychiatric/Behavioral: Positive for agitation (since back off meds x 1 week) and sleep disturbance. Negative for self-injury and suicidal ideas.   Per HPI unless specifically indicated above     Objective:    BP 133/83   Pulse 89   Temp 98.3 F (36.8 C)   Ht 5' 5.5" (1.664 m)   Wt 182 lb 6.4 oz (82.7 kg)   LMP 09/09/2016 (Approximate)   SpO2 99%   BMI 29.89 kg/m   Wt Readings from Last 3 Encounters:  09/25/16 182 lb 6.4 oz (82.7 kg)  08/23/16 176 lb (79.8 kg)  08/16/16 175 lb (79.4 kg)    Physical Exam  Constitutional: She is oriented to person, place, and time. She appears well-developed and well-nourished. No distress.  HENT:  Head: Atraumatic.  Eyes: Pupils are equal, round, and reactive to light. Conjunctivae are normal.  Neck: Normal range of motion. Neck supple.  Cardiovascular: Normal rate and normal heart sounds.   Pulmonary/Chest: Effort normal and breath sounds normal.  Musculoskeletal: Normal range of motion.  Neurological: She is alert and oriented to person, place, and time.  Skin: Skin is warm and dry.  Psychiatric:  She has a normal mood and affect. Her behavior is normal.  Nursing note and vitals reviewed.     Assessment & Plan:   Problem List Items Addressed This Visit      Other   Bipolar disorder ([place])    Doing very well back on the medications. Will increase zyprexa to 10 mg (was previously titrated up to 15 mg). Will also increase prozac to 40 mg, which was her previous stable dose. F/u in 1 month for re-evaluation of dosing.       Insomnia    Getting good sleep with trazodone nightly. Continue current regimen       Other Visit Diagnoses    Need for influenza vaccination    -  Primary   Relevant Orders   Flu Vaccine QUAD 36+ mos IM (Completed)       Follow up plan: Return in about 4 weeks (around 10/23/2016) for Bipolar  f/u.

## 2016-09-25 NOTE — Assessment & Plan Note (Signed)
Getting good sleep with trazodone nightly. Continue current regimen

## 2016-09-25 NOTE — Patient Instructions (Addendum)

## 2016-09-26 NOTE — Assessment & Plan Note (Addendum)
Doing very well back on the medications. Will increase zyprexa to 10 mg (was previously titrated up to 15 mg). Will also increase prozac to 40 mg, which was her previous stable dose. F/u in 1 month for re-evaluation of dosing.

## 2016-09-27 ENCOUNTER — Ambulatory Visit: Payer: Managed Care, Other (non HMO) | Admitting: Family Medicine

## 2016-10-31 ENCOUNTER — Other Ambulatory Visit: Payer: Self-pay

## 2016-10-31 ENCOUNTER — Other Ambulatory Visit: Payer: Self-pay | Admitting: Family Medicine

## 2016-10-31 MED ORDER — FLUOXETINE HCL 40 MG PO CAPS: 40.0000 mg | ORAL_CAPSULE | Freq: Every day | ORAL | 0 refills | Status: DC

## 2016-10-31 MED ORDER — OLANZAPINE 10 MG PO TABS: 10.0000 mg | ORAL_TABLET | Freq: Every day | ORAL | 0 refills | Status: DC

## 2016-10-31 MED ORDER — TRAZODONE HCL 50 MG PO TABS: 25.0000 mg | ORAL_TABLET | Freq: Every evening | ORAL | 3 refills | Status: DC | PRN

## 2016-10-31 MED ORDER — TRAZODONE HCL 50 MG PO TABS: 25.0000 mg | ORAL_TABLET | Freq: Every evening | ORAL | 0 refills | Status: DC | PRN

## 2016-10-31 NOTE — Telephone Encounter (Signed)
Routing to provider. Needs 90 day supply on meds.

## 2016-11-06 ENCOUNTER — Ambulatory Visit (INDEPENDENT_AMBULATORY_CARE_PROVIDER_SITE_OTHER): Payer: 59 | Admitting: Family Medicine

## 2016-11-06 ENCOUNTER — Encounter: Payer: Self-pay | Admitting: Family Medicine

## 2016-11-06 VITALS — BP 112/71 | HR 71 | Temp 98.4°F | Wt 186.0 lb

## 2016-11-06 DIAGNOSIS — F3131 Bipolar disorder, current episode depressed, mild: Secondary | ICD-10-CM

## 2016-11-06 MED ORDER — OLANZAPINE 15 MG PO TABS: 15.0000 mg | ORAL_TABLET | Freq: Every day | ORAL | 3 refills | Status: DC

## 2016-11-06 MED ORDER — TRAZODONE HCL 50 MG PO TABS: 25.0000 mg | ORAL_TABLET | Freq: Every evening | ORAL | 0 refills | Status: DC | PRN

## 2016-11-06 NOTE — Progress Notes (Signed)
   BP 112/71 (BP Location: Left Arm, Patient Position: Sitting, Cuff Size: Normal)   Pulse 71   Temp 98.4 F (36.9 C)   Wt 186 lb (84.4 kg)   SpO2 99%   BMI 30.48 kg/m    Subjective:    Patient ID: Carrie Burke, female    DOB: 1990-12-13, 26 y.o.   MRN: [id number]  HPI: Carrie Burke is a 26 y.o. female  Chief Complaint  Patient presents with  . Follow-up    Patient states no complaints.  . Manic Behavior   Patient presents today for bipolar follow up. Have been titrating zyprexa and prozac up to resume prior doses. Pt doing very well since getting back on medications. Moods are much more even and controllable, much less manic episodes. Still having some depressive moods. Trazodone helping quite a bit for her insomnia. Denies hallucinations, SI/HI, self harm.   Relevant past medical, surgical, family and social history reviewed and updated as indicated. Interim medical history since our last visit reviewed. Allergies and medications reviewed and updated.  Review of Systems  Constitutional: Negative.   HENT: Negative.   Eyes: Negative.   Respiratory: Negative.   Cardiovascular: Negative.   Gastrointestinal: Negative.   Genitourinary: Negative.   Musculoskeletal: Negative.   Neurological: Negative.   Psychiatric/Behavioral: Positive for dysphoric mood.   Per HPI unless specifically indicated above     Objective:    BP 112/71 (BP Location: Left Arm, Patient Position: Sitting, Cuff Size: Normal)   Pulse 71   Temp 98.4 F (36.9 C)   Wt 186 lb (84.4 kg)   SpO2 99%   BMI 30.48 kg/m   Wt Readings from Last 3 Encounters:  11/06/16 186 lb (84.4 kg)  09/25/16 182 lb 6.4 oz (82.7 kg)  08/23/16 176 lb (79.8 kg)    Physical Exam  Constitutional: She is oriented to person, place, and time. She appears well-developed and well-nourished. No distress.  HENT:  Head: Atraumatic.  Eyes: Pupils are equal, round, and reactive to light. Conjunctivae are normal.  Neck: Normal  range of motion. Neck supple.  Cardiovascular: Normal rate and normal heart sounds.   Pulmonary/Chest: Effort normal and breath sounds normal.  Musculoskeletal: Normal range of motion.  Neurological: She is alert and oriented to person, place, and time.  Skin: Skin is warm and dry.  Psychiatric: She has a normal mood and affect. Her behavior is normal.  Nursing note and vitals reviewed.     Assessment & Plan:   Problem List Items Addressed This Visit      Other   Bipolar disorder ([place]) - Primary    Doing fairly well on current regimen, still with some occasional breakthrough sxs. Increase zyprexa to 15 mg, continue trazodone 50 mg and prozac 40 mg.           Follow up plan: Return in about 3 months (around 02/06/2017) for Bipolar f/u.

## 2016-11-06 NOTE — Assessment & Plan Note (Signed)
Doing fairly well on current regimen, still with some occasional breakthrough sxs. Increase zyprexa to 15 mg, continue trazodone 50 mg and prozac 40 mg.

## 2016-11-06 NOTE — Patient Instructions (Signed)
Follow up in 3 months

## 2017-01-13 ENCOUNTER — Other Ambulatory Visit: Payer: Self-pay | Admitting: Unknown Physician Specialty

## 2017-01-13 NOTE — Telephone Encounter (Signed)
Copied from [place] [phone] [phone]. Topic: Quick Communication - See Telephone Encounter >> Jan 13, 2017 12:08 PM Vernona Rieger wrote: CRM for notification. See Telephone encounter for:   01/13/17.  [facility] rx needs clarification on prozac. Call back @ [phone] fax [phone] Reference number is [id number]

## 2017-01-13 NOTE — Telephone Encounter (Signed)
Tried calling [facility] back, automated message stated that the doctor line was currently closed. Will try to call again later.

## 2017-01-15 MED ORDER — FLUOXETINE HCL 40 MG PO CAPS: 40.0000 mg | ORAL_CAPSULE | Freq: Every day | ORAL | 0 refills | Status: DC

## 2017-01-15 NOTE — Telephone Encounter (Signed)
Tried [employer] Officer. Automated call still says that the doctor line is close. Will try to call again this afternoon.

## 2017-01-15 NOTE — Telephone Encounter (Signed)
Called and spoke with pharmacy tech at [facility]. She stated that the patient was wanting a refill on the prozac RX. Patient last seen 11/06/16 and has f/up 02/06/17.

## 2017-02-04 ENCOUNTER — Other Ambulatory Visit: Payer: Self-pay

## 2017-02-04 MED ORDER — TRAZODONE HCL 50 MG PO TABS: 25.0000 mg | ORAL_TABLET | Freq: Every evening | ORAL | 0 refills | Status: DC | PRN

## 2017-02-04 NOTE — Telephone Encounter (Signed)
Upcoming appointment 02/06/2017  Refill request for Trazadone 50mg  #[phone]

## 2017-02-06 ENCOUNTER — Encounter: Payer: Self-pay | Admitting: Family Medicine

## 2017-02-06 ENCOUNTER — Ambulatory Visit: Payer: Managed Care, Other (non HMO) | Admitting: Family Medicine

## 2017-02-06 VITALS — BP 123/81 | HR 84 | Temp 98.4°F | Wt 201.0 lb

## 2017-02-06 DIAGNOSIS — G4700 Insomnia, unspecified: Secondary | ICD-10-CM | POA: Diagnosis not present

## 2017-02-06 DIAGNOSIS — R635 Abnormal weight gain: Secondary | ICD-10-CM | POA: Diagnosis not present

## 2017-02-06 DIAGNOSIS — F3131 Bipolar disorder, current episode depressed, mild: Secondary | ICD-10-CM

## 2017-02-06 MED ORDER — TRAZODONE HCL 100 MG PO TABS: 100.0000 mg | ORAL_TABLET | Freq: Every day | ORAL | 1 refills | Status: DC

## 2017-02-06 MED ORDER — OLANZAPINE 15 MG PO TABS: 15.0000 mg | ORAL_TABLET | Freq: Every day | ORAL | 0 refills | Status: DC

## 2017-02-06 MED ORDER — LORCASERIN HCL ER 20 MG PO TB24: 1.0000 | ORAL_TABLET | Freq: Every day | ORAL | 0 refills | Status: DC

## 2017-02-06 MED ORDER — METRONIDAZOLE 0.75 % VA GEL: 1.0000 | Freq: Every day | VAGINAL | 3 refills | Status: DC

## 2017-02-06 NOTE — Progress Notes (Signed)
BP 123/81 (BP Location: Right Arm, Patient Position: Sitting, Cuff Size: Large)   Pulse 84   Temp 98.4 F (36.9 C) (Oral)   Wt 201 lb (91.2 kg)   LMP 02/01/2017 (Exact Date)   SpO2 97%   BMI 32.94 kg/m    Subjective:    Patient ID: Carrie Burke, female    DOB: 1990/03/30, 27 y.o.   MRN: [id number]  HPI: Carrie Burke is a 27 y.o. female  Chief Complaint  Patient presents with  . Follow-up    Patient here today to follow up on medications. Patient reports good tolerance and compliance with medications.   Pt here today for bipolar depression f/u. Doing well on prozac and zyprexa. States moods are under good control with no side effects noted. Taking trazodone for sleep, has been having to take two 50 mg tabs nightly the past few weeks as one stopped working for her. Getting good relief at that increased dose.   Also still concerned as her weight continues to increase despite good dietary habits and semi-regular exercise. Very frustrated with this and wanting to try something to help with weight loss.   Past Medical History:  Diagnosis Date  . Bipolar disorder ([place])   . Elevated serum glutamic pyruvic transaminase (SGPT) level   . Gestational diabetes   . History of pre-eclampsia   . Preeclampsia   . Vitamin D deficiency disease    Social History   Socioeconomic History  . Marital status: Married    Spouse name: Not on file  . Number of children: Not on file  . Years of education: Not on file  . Highest education level: Not on file  Social Needs  . Financial resource strain: Not on file  . Food insecurity - worry: Not on file  . Food insecurity - inability: Not on file  . Transportation needs - medical: Not on file  . Transportation needs - non-medical: Not on file  Occupational History  . Not on file  Tobacco Use  . Smoking status: Current Every Day Smoker    Packs/day: 1.00  . Smokeless tobacco: Never Used  Substance and Sexual Activity  . Alcohol use: Yes      Comment: Occasionally  . Drug use: No  . Sexual activity: Not on file  Other Topics Concern  . Not on file  Social History Narrative  . Not on file   Relevant past medical, surgical, family and social history reviewed and updated as indicated. Interim medical history since our last visit reviewed. Allergies and medications reviewed and updated.  Review of Systems  Constitutional: Positive for unexpected weight change.  HENT: Negative.   Respiratory: Negative.   Cardiovascular: Negative.   Gastrointestinal: Negative.   Musculoskeletal: Negative.   Skin: Negative.   Neurological: Negative.   Psychiatric/Behavioral: Positive for sleep disturbance.    Per HPI unless specifically indicated above     Objective:    BP 123/81 (BP Location: Right Arm, Patient Position: Sitting, Cuff Size: Large)   Pulse 84   Temp 98.4 F (36.9 C) (Oral)   Wt 201 lb (91.2 kg)   LMP 02/01/2017 (Exact Date)   SpO2 97%   BMI 32.94 kg/m   Wt Readings from Last 3 Encounters:  02/06/17 201 lb (91.2 kg)  11/06/16 186 lb (84.4 kg)  09/25/16 182 lb 6.4 oz (82.7 kg)    Physical Exam  Constitutional: She is oriented to person, place, and time. She appears well-developed and well-nourished. No  distress.  HENT:  Head: Atraumatic.  Eyes: Conjunctivae are normal. Pupils are equal, round, and reactive to light.  Neck: Normal range of motion. Neck supple.  Cardiovascular: Normal rate and normal heart sounds.  Pulmonary/Chest: Effort normal and breath sounds normal. No respiratory distress.  Musculoskeletal: Normal range of motion.  Neurological: She is alert and oriented to person, place, and time.  Skin: Skin is warm and dry.  Psychiatric: She has a normal mood and affect. Her behavior is normal. Judgment and thought content normal.  Nursing note and vitals reviewed.   Results for orders placed or performed in visit on 08/16/16  Microscopic Examination  Result Value Ref Range   WBC, UA 0-5 0 - 5  /hpf   RBC, UA None seen 0 - 2 /hpf   Epithelial Cells (non renal) 0-10 0 - 10 /hpf   Bacteria, UA Few None seen/Few  Urine Culture, Reflex  Result Value Ref Range   Urine Culture, Routine Final report    Organism ID, Bacteria Comment   UA/M w/rflx Culture, Routine  Result Value Ref Range   Specific Gravity, UA <1.005 (L) 1.005 - 1.030   pH, UA 6.0 5.0 - 7.5   Color, UA Yellow Yellow   Appearance Ur Cloudy (A) Clear   Leukocytes, UA 1+ (A) Negative   Protein, UA Negative Negative/Trace   Glucose, UA Negative Negative   Ketones, UA Negative Negative   RBC, UA Negative Negative   Bilirubin, UA Negative Negative   Urobilinogen, Ur 0.2 0.2 - 1.0 mg/dL   Nitrite, UA Negative Negative   Microscopic Examination See below:    Urinalysis Reflex Comment   hCG, serum, qualitative  Result Value Ref Range   hCG,Beta Subunit,Qual,Serum Negative Negative <6 mIU/mL      Assessment & Plan:   Problem List Items Addressed This Visit      Other   Bipolar disorder ([place]) - Primary    Stable and doing well on current regimen, continue      Insomnia    Will increase trazodone to 100 mg as this is what's been working for her. If needing to go up again after this, will switch medications.        Other Visit Diagnoses    Weight gain       Likely d/t her psychiatric medications at least in part, pt wanting to try belviq rather than change meds. Continue lifestyle modifications. Risks reviewed       Follow up plan: Return in about 6 weeks (around 03/20/2017) for Weight check.

## 2017-02-06 NOTE — Patient Instructions (Addendum)
Probiotic supplement with lactobacillus

## 2017-02-09 NOTE — Assessment & Plan Note (Signed)
Stable and doing well on current regimen, continue

## 2017-02-09 NOTE — Assessment & Plan Note (Signed)
Will increase trazodone to 100 mg as this is what's been working for her. If needing to go up again after this, will switch medications.

## 2017-02-10 ENCOUNTER — Telehealth: Payer: Self-pay | Admitting: Unknown Physician Specialty

## 2017-02-10 MED ORDER — NALTREXONE-BUPROPION HCL ER 8-90 MG PO TB12: ORAL_TABLET | ORAL | 0 refills | Status: DC

## 2017-02-10 NOTE — Telephone Encounter (Signed)
Called and spoke with pt, she would like to go ahead and try the contrave. Discussed possible side effects and concerns, she is agreeable and will stop the medicine and give [place] a call with any issues. Sending contrave over now. She'll come back for recheck in 4 weeks

## 2017-02-10 NOTE — Telephone Encounter (Signed)
Copied from [place]. Topic: Quick Communication - See Telephone Encounter >> Feb 10, 2017 10:04 AM Conception Chancy, NT wrote: CRM for notification. See Telephone encounter for:  02/10/17.  Pt was seen by [name] and she can not get her Belviq XR filled until she gives prior authorization. [facility].

## 2017-02-10 NOTE — Telephone Encounter (Signed)
PA has already been submitted and is pending review.

## 2017-02-10 NOTE — Telephone Encounter (Signed)
Insurance denied PA for [employer]. Patient must first try and fail Contrave  From Insurance   "(a) You have a failure to lose greater than or equal to 5% of baseline body weight after at least 16 weeks (one full course) of Contrave therapy; OR (b) you have intolerance or contraindication to Contrave therapy."

## 2017-02-13 NOTE — Telephone Encounter (Signed)
Patient notified of PA approval.

## 2017-02-13 NOTE — Telephone Encounter (Signed)
Prior Authorization Sent. Pending review.

## 2017-02-13 NOTE — Telephone Encounter (Signed)
Pt called in to make provider aware that contrave needs a PA.  Please assist further.

## 2017-03-10 ENCOUNTER — Other Ambulatory Visit: Payer: Self-pay | Admitting: Family Medicine

## 2017-03-10 NOTE — Telephone Encounter (Signed)
RX sent

## 2017-03-13 ENCOUNTER — Ambulatory Visit: Payer: Self-pay | Admitting: *Deleted

## 2017-03-13 NOTE — Telephone Encounter (Signed)
Needs to go to ER if getting acutely worse, but will evaluate her tomorrow if she feels it's ok to wait

## 2017-03-13 NOTE — Telephone Encounter (Signed)
Routing to provider for FYI and advise if needed.

## 2017-03-13 NOTE — Telephone Encounter (Signed)
I tried calling patient, no answer and no VM has been set up. Will try again before 5:00PM.

## 2017-03-13 NOTE — Telephone Encounter (Signed)
Pt called with complaints of dizziness and falling for the past week; she also says that sometimes when she looks down she looses her balance; pt also states that her mother has cerebellar atrophy and she wants to make sure this is not what this is not what her mother has; nurse triage initiated and recommendations made per protocol to include seeing a physician within 24 hours; pt requests appointment with [name] at [facility]; pt offered and accepted appointment with [name] on 03/14/17 at 1045; pt verbalizes  Understanding. Reason for Disposition . [1] MODERATE dizziness (e.g., interferes with normal activities) AND [2] has NOT been evaluated by physician for this  (Exception: dizziness caused by heat exposure, sudden standing, or poor fluid intake)  Answer Assessment - Initial Assessment Questions 1. DESCRIPTION: "Describe your dizziness."     Light headed 2. LIGHTHEADED: "Do you feel lightheaded?" (e.g., somewhat faint, woozy, weak upon standing)     Happens at any time; can be standing, walking, or sitting 3. VERTIGO: "Do you feel like either you or the room is spinning or tilting?" (i.e. vertigo)     yes 4. SEVERITY: "How bad is it?"  "Do you feel like you are going to faint?" "Can you stand and walk?"   - MILD - walking normally   - MODERATE - interferes with normal activities (e.g., work, school)    - SEVERE - unable to stand, requires support to walk, feels like passing out now.      Pt states she doesn't pass out but she falls because she feels like her balance is off  5. ONSET:  "When did the dizziness begin?"     03/09/17 6. AGGRAVATING FACTORS: "Does anything make it worse?" (e.g., standing, change in head position)     Looking down 7. HEART RATE: "Can you tell me your heart rate?" "How many beats in 15 seconds?"  (Note: not all patients can do this)       Pt can not perform task 8. CAUSE: "What do you think is causing the dizziness?"     Not sure 9. RECURRENT  SYMPTOM: "Have you had dizziness before?" If so, ask: "When was the last time?" "What happened that time?"     No last episode2/28/19 at 0830 10. OTHER SYMPTOMS: "Do you have any other symptoms?" (e.g., fever, chest pain, vomiting, diarrhea, bleeding)       no 11. PREGNANCY: "Is there any chance you are pregnant?" "When was your last menstrual period?"       No tubal ligation; LMP 03/07/17  Protocols used: DIZZINESS [facility]

## 2017-03-14 ENCOUNTER — Ambulatory Visit: Payer: Managed Care, Other (non HMO) | Admitting: Family Medicine

## 2017-03-14 ENCOUNTER — Encounter: Payer: Self-pay | Admitting: Family Medicine

## 2017-03-14 DIAGNOSIS — R270 Ataxia, unspecified: Secondary | ICD-10-CM | POA: Diagnosis not present

## 2017-03-14 MED ORDER — MECLIZINE HCL 25 MG PO TABS: 25.0000 mg | ORAL_TABLET | Freq: Three times a day (TID) | ORAL | 0 refills | Status: DC | PRN

## 2017-03-14 NOTE — Telephone Encounter (Signed)
Patient has an appointment this morning at 10:45AM

## 2017-03-14 NOTE — Progress Notes (Signed)
BP 129/84 (BP Location: Left Arm, Patient Position: Sitting, Cuff Size: Normal)   Pulse 72   Temp 98 F (36.7 C) (Oral)   Wt 199 lb 6.4 oz (90.4 kg)   SpO2 99%   BMI 32.68 kg/m    Subjective:    Patient ID: Carrie Burke, female    DOB: 04-09-1990, 27 y.o.   MRN: [id number]  HPI: [place] Moyd is a 27 y.o. female  Chief Complaint  Patient presents with  . Dizziness    Patient has fell 3 times this week. Sunday, Tuesday, Wednesday. Denies hitting her head, only knee. States she has been dizzy for a while, but has became worse and she has no balanace. Mother has Cerebrelum Atrophy and is concerned.  . Fall   Pt here with acute worsening of chronic dizziness issues, now with some ataxic episodes with 3 associated falls the past week. Room spinning dizziness for years, states its daily but not all day long. Now with loss of balance, mild HA, extremity weakness, and 3 falls without head impact or LOC. Very concerned as her mother has cerebellar atrophy and very similar sxs. Denies any head trauma or LOC during any falls, hx of head trauma, speech or visual issues, confusion. Has not been trying anything for her sxs other than resting.   Relevant past medical, surgical, family and social history reviewed and updated as indicated. Interim medical history since our last visit reviewed. Allergies and medications reviewed and updated.  Review of Systems  Per HPI unless specifically indicated above     Objective:    BP 129/84 (BP Location: Left Arm, Patient Position: Sitting, Cuff Size: Normal)   Pulse 72   Temp 98 F (36.7 C) (Oral)   Wt 199 lb 6.4 oz (90.4 kg)   SpO2 99%   BMI 32.68 kg/m   Wt Readings from Last 3 Encounters:  03/14/17 199 lb 6.4 oz (90.4 kg)  02/06/17 201 lb (91.2 kg)  11/06/16 186 lb (84.4 kg)    Physical Exam  Constitutional: She is oriented to person, place, and time. She appears well-developed and well-nourished. No distress.  HENT:  Head:  Atraumatic.  Eyes: Conjunctivae are normal. Pupils are equal, round, and reactive to light. No scleral icterus.  Neck: Normal range of motion. Neck supple.  Cardiovascular: Normal rate and normal heart sounds.  Pulmonary/Chest: Effort normal and breath sounds normal.  Musculoskeletal:  Unsteady, slow, effortful gait Strength full and equal in all 4 extremities  Lymphadenopathy:    She has no cervical adenopathy.  Neurological: She is alert and oriented to person, place, and time. She exhibits normal muscle tone.  - pronator drift, no gross neurologic deficits on basic exam  Skin: Skin is warm and dry. No erythema.  Nursing note and vitals reviewed.  Results for orders placed or performed in visit on 08/16/16  Microscopic Examination  Result Value Ref Range   WBC, UA 0-5 0 - 5 /hpf   RBC, UA None seen 0 - 2 /hpf   Epithelial Cells (non renal) 0-10 0 - 10 /hpf   Bacteria, UA Few None seen/Few  Urine Culture, Reflex  Result Value Ref Range   Urine Culture, Routine Final report    Organism ID, Bacteria Comment   UA/M w/rflx Culture, Routine  Result Value Ref Range   Specific Gravity, UA <1.005 (L) 1.005 - 1.030   pH, UA 6.0 5.0 - 7.5   Color, UA Yellow Yellow   Appearance Ur Cloudy (A) Clear  Leukocytes, UA 1+ (A) Negative   Protein, UA Negative Negative/Trace   Glucose, UA Negative Negative   Ketones, UA Negative Negative   RBC, UA Negative Negative   Bilirubin, UA Negative Negative   Urobilinogen, Ur 0.2 0.2 - 1.0 mg/dL   Nitrite, UA Negative Negative   Microscopic Examination See below:    Urinalysis Reflex Comment   hCG, serum, qualitative  Result Value Ref Range   hCG,Beta Subunit,Qual,Serum Negative Negative <6 mIU/mL      Assessment & Plan:   Problem List Items Addressed This Visit    None    Visit Diagnoses    Ataxia       Relevant Orders   MR Brain W Wo Contrast   Ambulatory referral to Neurology    Small possibility that sxs are related to severe  vertigo, will tx with meclizine to see if any relief. Will get stat MRI brain to assess for structural causes or bleeds, particularly given fhx. Urgent neurologic consult generated additionally to further work up pt's concerning sxs. Strict return precautions given to pt.    Follow up plan: Return for as scheduled.

## 2017-03-17 ENCOUNTER — Ambulatory Visit
Admission: RE | Admit: 2017-03-17 | Discharge: 2017-03-17 | Disposition: A | Discharge status: Home / Self Care | Payer: Managed Care, Other (non HMO) | Source: Ambulatory Visit | Attending: Family Medicine | Admitting: Family Medicine

## 2017-03-17 ENCOUNTER — Telehealth: Payer: Self-pay | Admitting: Neurology

## 2017-03-17 ENCOUNTER — Other Ambulatory Visit: Payer: Self-pay

## 2017-03-17 ENCOUNTER — Other Ambulatory Visit: Payer: Self-pay | Admitting: Family Medicine

## 2017-03-17 DIAGNOSIS — R29818 Other symptoms and signs involving the nervous system: Secondary | ICD-10-CM

## 2017-03-17 DIAGNOSIS — R270 Ataxia, unspecified: Secondary | ICD-10-CM

## 2017-03-17 MED ORDER — GADOBENATE DIMEGLUMINE 529 MG/ML IV SOLN
19.0000 mL | Freq: Once | INTRAVENOUS | Status: AC | PRN
  Administered 2017-03-17: 19 mL via INTRAVENOUS

## 2017-03-17 NOTE — Patient Instructions (Signed)
Follow up as scheduled.  

## 2017-03-17 NOTE — Telephone Encounter (Signed)
Keri with [facility] calling. Patient has new patient appointment with Dr. Jannifer Franklin tomorrow and had a STAT MRI today. MRI showed 72mm left cerebral angle mass.most consistent with lipoma. They would like the MRI to be seen by Dr. Jannifer Franklin before office visit tomorrow. It is in Epic.  If any questions please call Keri at [phone].

## 2017-03-18 ENCOUNTER — Other Ambulatory Visit: Payer: Self-pay

## 2017-03-18 ENCOUNTER — Encounter: Payer: Self-pay | Admitting: Neurology

## 2017-03-18 ENCOUNTER — Ambulatory Visit: Payer: Managed Care, Other (non HMO) | Admitting: Neurology

## 2017-03-18 ENCOUNTER — Telehealth: Payer: Self-pay | Admitting: Family Medicine

## 2017-03-18 DIAGNOSIS — G43019 Migraine without aura, intractable, without status migrainosus: Secondary | ICD-10-CM | POA: Diagnosis not present

## 2017-03-18 DIAGNOSIS — R269 Unspecified abnormalities of gait and mobility: Secondary | ICD-10-CM

## 2017-03-18 HISTORY — DX: Migraine without aura, intractable, without status migrainosus: G43.019

## 2017-03-18 HISTORY — DX: Unspecified abnormalities of gait and mobility: R26.9

## 2017-03-18 MED ORDER — TOPIRAMATE 25 MG PO TABS: ORAL_TABLET | ORAL | 3 refills | Status: DC

## 2017-03-18 NOTE — Telephone Encounter (Signed)
Spoke with pt about MRI findings, she's got her Neuro appt at 3:30 this afternoon.

## 2017-03-18 NOTE — Telephone Encounter (Signed)
Tried calling pt but voicemail box full - need to discuss MRI results with her

## 2017-03-18 NOTE — Progress Notes (Signed)
Reason for visit: Dizziness, gait instability, headache  Referring physician: Dr. Silas Flood is a 27 y.o. female  History of present illness:  Carrie Burke is a 27 year old right-handed white female with a history of bipolar disorder and obesity.  The patient has had a lifelong history of dizziness and vertigo.  She has been somewhat clumsy throughout her life, but over the last week or 2 she has had a significant change in her ability to ambulate.  She has fallen on 3 occasions.  She has had some increased problems with the dizziness and she has had worsening of her headaches.  She has had frequent headaches over the last 5-10 years, the headaches are virtually daily in nature.  The headaches are in the back of the head and may be associated with some occasional photophobia or phonophobia.  The patient denies any significant nausea.  She will take ibuprofen if needed.  The patient is able to work through the headache usually.  The patient reports no numbness or weakness of the face, arms, legs.  She denies any speech disturbance or problems with swallowing.  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  She has had a recent MRI of the brain shows a lipoma in the left CP angle encasing the 7th and 8th cranial nerves.  The patient is sent to this office for an evaluation.  Past Medical History:  Diagnosis Date  . Bipolar disorder (Boyd)   . Common migraine with intractable migraine 03/18/2017  . Elevated serum glutamic pyruvic transaminase (SGPT) level   . Gait abnormality 03/18/2017  . Gestational diabetes   . History of pre-eclampsia   . Preeclampsia   . Vitamin D deficiency disease     Past Surgical History:  Procedure Laterality Date  . CLAVICLE SURGERY  2014  . TUBAL LIGATION      Family History  Problem Relation Age of Onset  . Arthritis Mother   . Heart disease Father   . Hyperlipidemia Father   . Hypertension  Father   . Asthma Brother     Social history:  reports that she has been smoking.  She has been smoking about 1.00 pack per day. she has never used smokeless tobacco. She reports that she drinks alcohol. She reports that she does not use drugs.  Medications:  Prior to Admission medications   Medication Sig Start Date End Date Taking? Authorizing Provider  cyclobenzaprine (FLEXERIL) 5 MG tablet Take 1 tablet (5 mg total) by mouth 3 (three) times daily as needed for muscle spasms. 07/19/15  Yes Volney American, PA-C  FLUoxetine (PROZAC) 40 MG capsule Take 1 capsule (40 mg total) by mouth daily. 01/15/17  Yes Volney American, PA-C  meclizine (ANTIVERT) 25 MG tablet Take 1 tablet (25 mg total) by mouth 3 (three) times daily as needed for dizziness. 03/14/17  Yes Volney American, PA-C  Naltrexone-buPROPion HCl ER (CONTRAVE) 8-90 MG TB12 Take 2 tabs twice daily 03/10/17  Yes Volney American, PA-C  OLANZapine (ZYPREXA) 15 MG tablet Take 1 tablet (15 mg total) by mouth at bedtime. 02/06/17  Yes Volney American, PA-C  traZODone (DESYREL) 100 MG tablet Take 1 tablet (100 mg total) by mouth at bedtime. 02/06/17  Yes Volney American, PA-C  topiramate (TOPAMAX) 25 MG tablet Take one tablet at night for one week, then take 2 tablets at night for one week, then take 3 tablets  at night. 03/18/17   Kathrynn Ducking, MD     No Known Allergies  ROS:  Out of a complete 14 system review of symptoms, the patient complains only of the following symptoms, and all other reviewed systems are negative.  Fatigue Ringing in the ears, dizziness Blurred vision Snoring Increased thirst Allergies Memory loss, confusion, headache, dizziness Depression, not enough sleep Insomnia  Blood pressure 126/73, pulse 80, height 5\' 6"  (1.676 m), weight 199 lb 8 oz (90.5 kg), last menstrual period 03/07/2017.  Physical Exam  General: The patient is alert and cooperative at the time of the  examination.  The patient is markedly obese.  Eyes: Pupils are equal, round, and reactive to light. Discs are flat bilaterally.  Neck: The neck is supple, no carotid bruits are noted.  Respiratory: The respiratory examination is clear.  Cardiovascular: The cardiovascular examination reveals a regular rate and rhythm, no obvious murmurs or rubs are noted.  Skin: Extremities are without significant edema.  Neurologic Exam  Mental status: The patient is alert and oriented x 3 at the time of the examination. The patient has apparent normal recent and remote memory, with an apparently normal attention span and concentration ability.  Cranial nerves: Facial symmetry is present. There is good sensation of the face to pinprick and soft touch bilaterally. The strength of the facial muscles and the muscles to head turning and shoulder shrug are normal bilaterally. Speech is well enunciated, no aphasia or dysarthria is noted. Extraocular movements are full.  The patient has strabismus, exotropia of the left eye.  The patient has some coarse horizontal nystagmus with end gaze bilaterally.  Visual fields are full. The tongue is midline, and the patient has symmetric elevation of the soft palate. No obvious hearing deficits are noted.  Motor: The motor testing reveals 5 over 5 strength of all 4 extremities. Good symmetric motor tone is noted throughout.  Sensory: Sensory testing is intact to pinprick, soft touch, vibration sensation, and position sense on all 4 extremities. No evidence of extinction is noted.  Coordination: Cerebellar testing reveals good finger-nose-finger and heel-to-shin bilaterally.  Gait and station: Gait is slightly wide-based. Tandem gait is slightly unsteady. Romberg is negative. No drift is seen.  Reflexes: Deep tendon reflexes are symmetric and normal bilaterally. Toes are downgoing bilaterally.   MRI brain 03/17/17:  IMPRESSION: 1. 16 mm left cerebellopontine angle mass  most consistent with a lipoma. 2. Otherwise unremarkable appearance of the brain.  * MRI scan images were reviewed online. I agree with the written report.    Assessment/Plan:  1.  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  Lifelong history of dizziness  3.  Chronic history of daily headaches  4.  Gait instability, recent worsening  The patient does have some involvement of the 7th and 8th cranial nerves on the left by the lipoma, it is possible that this may be affecting her vestibular input to the brainstem.  The patient will be placed on Topamax for the headaches.  Migraine headaches may also result in dizziness.  The patient will be sent for physical therapy for gait training.  She will follow-up in 3 months.  We will plan on rechecking MRI of the brain in about 6 months.  Jill Alexanders MD 03/18/2017 4:56 PM  [facility] [address] [place], [place] [address]  Phone [phone] Fax [phone]

## 2017-03-18 NOTE — Patient Instructions (Signed)
   WE will start Topamax for the headache.  Topamax (topiramate) is a seizure medication that has an FDA approval for seizures and for migraine headache. Potential side effects of this medication include weight loss, cognitive slowing, tingling in the fingers and toes, and carbonated drinks will taste bad. If any significant side effects are noted on this drug, please contact our office.

## 2017-03-20 ENCOUNTER — Ambulatory Visit: Payer: Managed Care, Other (non HMO) | Admitting: Family Medicine

## 2017-03-24 ENCOUNTER — Encounter: Payer: Self-pay | Admitting: Family Medicine

## 2017-03-24 ENCOUNTER — Ambulatory Visit: Payer: Managed Care, Other (non HMO) | Admitting: Family Medicine

## 2017-03-24 VITALS — BP 114/74 | HR 102 | Wt 201.0 lb

## 2017-03-24 DIAGNOSIS — F3131 Bipolar disorder, current episode depressed, mild: Secondary | ICD-10-CM

## 2017-03-24 DIAGNOSIS — E669 Obesity, unspecified: Secondary | ICD-10-CM | POA: Diagnosis not present

## 2017-03-24 DIAGNOSIS — G43019 Migraine without aura, intractable, without status migrainosus: Secondary | ICD-10-CM

## 2017-03-24 NOTE — Progress Notes (Signed)
BP 114/74   Pulse (!) 102   Wt 201 lb (91.2 kg)   LMP 03/07/2017   SpO2 98%   BMI 32.44 kg/m    Subjective:    Patient ID: Carrie Burke, female    DOB: [birth year], 27 y.o.   MRN: [id number]  HPI: Beverely Suen is a 27 y.o. female  Chief Complaint  Patient presents with  . Follow-up   Pt here today for 1 month f/u weight check since starting contrave. Tolerating medication well, no known side effects. Feels pretty good about her diet and exercise regimen currently, but has had a lot going on with some new neurologic issues coming up that have been distracting so could make some improvements. Also recently started on topamax through Neurology. Denies CP, palpitations, syncope, sweats.   Relevant past medical, surgical, family and social history reviewed and updated as indicated. Interim medical history since our last visit reviewed. Allergies and medications reviewed and updated.  Review of Systems  Per HPI unless specifically indicated above     Objective:    BP 114/74   Pulse (!) 102   Wt 201 lb (91.2 kg)   LMP 03/07/2017   SpO2 98%   BMI 32.44 kg/m   Wt Readings from Last 3 Encounters:  03/24/17 201 lb (91.2 kg)  03/18/17 199 lb 8 oz (90.5 kg)  03/14/17 199 lb 6.4 oz (90.4 kg)    Physical Exam  Constitutional: She is oriented to person, place, and time. She appears well-developed and well-nourished. No distress.  HENT:  Head: Atraumatic.  Eyes: Conjunctivae are normal. Pupils are equal, round, and reactive to light.  Neck: Normal range of motion. Neck supple.  Cardiovascular: Normal rate and normal heart sounds.  Pulmonary/Chest: Effort normal and breath sounds normal.  Musculoskeletal: Normal range of motion.  Neurological: She is alert and oriented to person, place, and time.  Skin: Skin is warm and dry.  Psychiatric: She has a normal mood and affect. Her behavior is normal.  Nursing note and vitals reviewed.  Results for orders placed or performed  in visit on 08/16/16  Microscopic Examination  Result Value Ref Range   WBC, UA 0-5 0 - 5 /hpf   RBC, UA None seen 0 - 2 /hpf   Epithelial Cells (non renal) 0-10 0 - 10 /hpf   Bacteria, UA Few None seen/Few  Urine Culture, Reflex  Result Value Ref Range   Urine Culture, Routine Final report    Organism ID, Bacteria Comment   UA/M w/rflx Culture, Routine  Result Value Ref Range   Specific Gravity, UA <1.005 (L) 1.005 - 1.030   pH, UA 6.0 5.0 - 7.5   Color, UA Yellow Yellow   Appearance Ur Cloudy (A) Clear   Leukocytes, UA 1+ (A) Negative   Protein, UA Negative Negative/Trace   Glucose, UA Negative Negative   Ketones, UA Negative Negative   RBC, UA Negative Negative   Bilirubin, UA Negative Negative   Urobilinogen, Ur 0.2 0.2 - 1.0 mg/dL   Nitrite, UA Negative Negative   Microscopic Examination See below:    Urinalysis Reflex Comment   hCG, serum, qualitative  Result Value Ref Range   hCG,Beta Subunit,Qual,Serum Negative Negative <6 mIU/mL      Assessment & Plan:   Problem List Items Addressed This Visit      Cardiovascular and Mediastinum   Common migraine with intractable migraine    Tolerating topamax well, not having significant relief yet with her migraines. Recent MRI  finding small lipoma in cerebellum - now being followed by Neurology for her migraines and to monitor mass.         Other   Bipolar disorder (Biron) - Primary    Feels moods are still doing very well, does not want to change current regimen despite possible weight side effects. Continue working hard on weight loss      Obesity (BMI 30.0-34.9)    Tolerating contrave well at full strength dose, no weight loss yet (gain of 2 lb) but pt wanting to keep trying. Hoping in combination with the topamax she will see even better weight results. Continue working hard on diet and exercise modifications. Recheck in 6 weeks          Follow up plan: Return in about 6 weeks (around 05/05/2017) for Weight  gain.

## 2017-03-26 DIAGNOSIS — E669 Obesity, unspecified: Secondary | ICD-10-CM | POA: Insufficient documentation

## 2017-03-26 NOTE — Patient Instructions (Signed)
Follow-up in 6 weeks

## 2017-03-26 NOTE — Assessment & Plan Note (Addendum)
Tolerating contrave well at full strength dose, no weight loss yet (gain of 2 lb) but pt wanting to keep trying. Hoping in combination with the topamax she will see even better weight results. Continue working hard on diet and exercise modifications. Recheck in 6 weeks

## 2017-03-26 NOTE — Assessment & Plan Note (Signed)
Tolerating topamax well, not having significant relief yet with her migraines. Recent MRI finding small lipoma in cerebellum - now being followed by Neurology for her migraines and to monitor mass.

## 2017-03-26 NOTE — Assessment & Plan Note (Signed)
Feels moods are still doing very well, does not want to change current regimen despite possible weight side effects. Continue working hard on weight loss

## 2017-04-03 ENCOUNTER — Other Ambulatory Visit: Payer: Self-pay | Admitting: Family Medicine

## 2017-04-07 ENCOUNTER — Ambulatory Visit: Payer: Managed Care, Other (non HMO) | Attending: Neurology | Admitting: Physical Therapy

## 2017-04-10 ENCOUNTER — Other Ambulatory Visit: Payer: Self-pay | Admitting: Family Medicine

## 2017-04-29 ENCOUNTER — Other Ambulatory Visit: Payer: Self-pay

## 2017-04-29 ENCOUNTER — Ambulatory Visit
Admission: EM | Admit: 2017-04-29 | Discharge: 2017-04-29 | Disposition: A | Discharge status: Home / Self Care | Payer: Managed Care, Other (non HMO) | Attending: Family Medicine | Admitting: Family Medicine

## 2017-04-29 ENCOUNTER — Encounter: Payer: Self-pay | Admitting: Emergency Medicine

## 2017-04-29 DIAGNOSIS — R51 Headache: Secondary | ICD-10-CM

## 2017-04-29 DIAGNOSIS — R05 Cough: Secondary | ICD-10-CM

## 2017-04-29 DIAGNOSIS — R69 Illness, unspecified: Principal | ICD-10-CM

## 2017-04-29 DIAGNOSIS — R509 Fever, unspecified: Secondary | ICD-10-CM | POA: Diagnosis not present

## 2017-04-29 DIAGNOSIS — J111 Influenza due to unidentified influenza virus with other respiratory manifestations: Secondary | ICD-10-CM

## 2017-04-29 MED ORDER — OSELTAMIVIR PHOSPHATE 75 MG PO CAPS: 75.0000 mg | ORAL_CAPSULE | Freq: Two times a day (BID) | ORAL | 0 refills | Status: DC

## 2017-04-29 MED ORDER — BENZONATATE 100 MG PO CAPS: 100.0000 mg | ORAL_CAPSULE | Freq: Three times a day (TID) | ORAL | 0 refills | Status: DC | PRN

## 2017-04-29 NOTE — ED Provider Notes (Addendum)
[facility] ____________________________________________  Time seen: Approximately 1:30PM  I have reviewed the triage vital signs and the nursing notes.   HISTORY  Chief Complaint Headache; Cough; and Fever   HPI Carrie Burke is a 27 y.o. female presented for evaluation of just over 2 days of cough, congestion, nasal drainage, chills, body aches and headache.  States later Saturday night she began having some headache and nausea with vomiting, states still occasional nausea but vomiting has since resolved that night.  Reports intermittent headache and vomiting is not uncommon with her, and reports initially she thought it was onset of a migraine, until other symptoms followed. States on Sunday she began having cough, nasal congestion, chills and body aches.  Reports fever, not measured by thermometer.  Has taken some over-the-counter ibuprofen intermittently, last took earlier this morning.  States she was sent home from work today due to fever, reports subjective not measured.  States occasional scratchy sore throat but not persistent sore throat.  Reports decreased appetite and not eating and drinking as much as normal.  Denies other aggravating or alleviating factors. Denies chest pain, shortness of breath, abdominal pain, dysuria, extremity pain, extremity swelling or rash. Denies recent sickness. Denies recent antibiotic use.   Volney American, PA-C: PCP Patient's last menstrual period was 04/08/2017 (approximate).DEnies pregnancy.   Past Medical History:  Diagnosis Date  . Bipolar disorder ([place])   . Common migraine with intractable migraine 03/18/2017  . Elevated serum glutamic pyruvic transaminase (SGPT) level   . Gait abnormality 03/18/2017  . Gestational diabetes   . History of pre-eclampsia   . Preeclampsia   . Vitamin D deficiency disease     Patient Active Problem List   Diagnosis Date Noted  . Obesity (BMI 30.0-34.9) 03/26/2017  . Gait  abnormality 03/18/2017  . Common migraine with intractable migraine 03/18/2017  . Insomnia 04/09/2015  . Tobacco abuse 03/22/2015  . Pain in right shoulder 02/27/2015  . Vitamin D deficiency disease   . Bipolar disorder [facility])     Past Surgical History:  Procedure Laterality Date  . CLAVICLE SURGERY  2014  . TUBAL LIGATION       No current facility-administered medications for this encounter.   Current Outpatient Medications:  .  CONTRAVE 8-90 MG TB12, TAKE 2 TABS TWICE DAILY, Disp: 120 tablet, Rfl: 0 .  cyclobenzaprine (FLEXERIL) 5 MG tablet, Take 1 tablet (5 mg total) by mouth 3 (three) times daily as needed for muscle spasms., Disp: 30 tablet, Rfl: 1 .  FLUoxetine (PROZAC) 40 MG capsule, TAKE 1 CAPSULE BY MOUTH  DAILY, Disp: 90 capsule, Rfl: 0 .  OLANZapine (ZYPREXA) 15 MG tablet, Take 1 tablet (15 mg total) by mouth at bedtime., Disp: 14 tablet, Rfl: 0 .  topiramate (TOPAMAX) 25 MG tablet, Take one tablet at night for one week, then take 2 tablets at night for one week, then take 3 tablets at night., Disp: 90 tablet, Rfl: 3 .  traZODone (DESYREL) 100 MG tablet, Take 1 tablet (100 mg total) by mouth at bedtime., Disp: 90 tablet, Rfl: 1 .  benzonatate (TESSALON PERLES) 100 MG capsule, Take 1 capsule (100 mg total) by mouth 3 (three) times daily as needed for cough., Disp: 15 capsule, Rfl: 0 .  oseltamivir (TAMIFLU) 75 MG capsule, Take 1 capsule (75 mg total) by mouth every 12 (twelve) hours., Disp: 10 capsule, Rfl: 0  Allergies Patient has no known allergies.  Family History  Problem Relation Age of Onset  .  Arthritis Mother   . Heart disease Father   . Hyperlipidemia Father   . Hypertension Father   . Asthma Brother     Social History Social History   Tobacco Use  . Smoking status: Current Every Day Smoker    Packs/day: 1.00  . Smokeless tobacco: Never Used  Substance Use Topics  . Alcohol use: Yes    Comment: Occasionally  . Drug use: No    Review of  Systems Constitutional: As above Eyes: No visual changes. ENT: As above.  Cardiovascular: Denies chest pain. Respiratory: Denies shortness of breath. Gastrointestinal: No abdominal pain.   Genitourinary: Negative for dysuria. Musculoskeletal: Negative for back pain. Skin: Negative for rash. Neurological: Negative for focal weakness or numbness.  ____________________________________________   PHYSICAL EXAM:  VITAL SIGNS: ED Triage Vitals  Enc Vitals Group     BP 04/29/17 1224 112/72     Pulse Rate 04/29/17 1224 78     Resp 04/29/17 1224 14     Temp 04/29/17 1224 98.2 F (36.8 C)     Temp Source 04/29/17 1224 Oral     SpO2 04/29/17 1224 100 %     Weight 04/29/17 1223 201 lb (91.2 kg)     Height 04/29/17 1223 5\' 7"  (1.702 m)     Head Circumference --      Peak Flow --      Pain Score 04/29/17 1223 7     Pain Loc --      Pain Edu? --      Excl. in [place]? --    Constitutional: Alert and oriented. Well appearing and in no acute distress. Eyes: Conjunctivae are normal. PERRL. EOMI. Head: Atraumatic. No sinus tenderness to palpation. No swelling. No erythema.  Ears: no erythema, normal TMs bilaterally.   Nose:Nasal congestion with clear rhinorrhea  Mouth/Throat: Mucous membranes are moist. Mild pharyngeal erythema. No tonsillar swelling or exudate.  Neck: No stridor.  No cervical spine tenderness to palpation. Hematological/Lymphatic/Immunilogical: No cervical lymphadenopathy. Cardiovascular: Normal rate, regular rhythm. Grossly normal heart sounds.  Good peripheral circulation. Respiratory: Normal respiratory effort.  No retractions. No wheezes, rales or rhonchi. Good air movement.  Gastrointestinal: Soft and nontender.  Musculoskeletal: Ambulatory with steady gait. No cervical, thoracic or lumbar tenderness to palpation. Neurologic:  Normal speech and language. No gait instability.No focal neurological deficits. No paresthesias. No meningismus.  Skin:  Skin appears warm, dry  and intact. No rash noted. Psychiatric: Mood and affect are normal. Speech and behavior are normal.  ___________________________________________   LABS (all labs ordered are listed, but only abnormal results are displayed)  Labs Reviewed - No data to display ____________________________________________   PROCEDURES  INITIAL IMPRESSION / ASSESSMENT AND PLAN / ED COURSE  Pertinent labs & imaging results that were available during my care of the patient were reviewed by me and considered in my medical decision making (see chart for details).  Well-appearing patient.  No acute distress.  Suspect influenza-like illness.  Discussed evaluation of strep swab, patient declined at this time and reports she will continue to monitor.  Discussed use of Tamiflu and appropriate timeframe, discussed possible benefits, Rx given.  PRN Tessalon Perles.  Encourage rest, fluids, supportive care.  Work note given for today and tomorrow.Discussed indication, risks and benefits of medications with patient.  Discussed follow up with Primary care physician this week. Discussed follow up and return parameters including no resolution or any worsening concerns. Patient verbalized understanding and agreed to plan.   ____________________________________________   FINAL  CLINICAL IMPRESSION(S) / ED DIAGNOSES  Final diagnoses:  Influenza-like illness     ED Discharge Orders        Ordered    oseltamivir (TAMIFLU) 75 MG capsule  Every 12 hours     04/29/17 1332    benzonatate (TESSALON PERLES) 100 MG capsule  3 times daily PRN     04/29/17 1332       Note: This dictation was prepared with Dragon dictation along with smaller phrase technology. Any transcriptional errors that result from this process are unintentional.        Marylene Land, NP 04/29/17 1847

## 2017-04-29 NOTE — Discharge Instructions (Signed)
Take medication as prescribed. Rest. Drink plenty of fluids.  ° °Follow up with your primary care physician this week as needed. Return to Urgent care for new or worsening concerns.  ° °

## 2017-04-29 NOTE — ED Triage Notes (Signed)
Patient c/o sinus congestion, cough, HAs, and dizziness that started Saturday.  Patient reports bodyaches and fever.

## 2017-05-05 ENCOUNTER — Ambulatory Visit: Payer: Managed Care, Other (non HMO) | Admitting: Family Medicine

## 2017-06-14 ENCOUNTER — Other Ambulatory Visit: Payer: Self-pay | Admitting: Family Medicine

## 2017-06-17 NOTE — Telephone Encounter (Signed)
fluoxetine refill Last Refill:04/04/17 # 90 No RF Last OV: 03/24/17 PCP: [name] PA Pharmacy:[facility]

## 2017-06-18 ENCOUNTER — Ambulatory Visit: Payer: Managed Care, Other (non HMO) | Admitting: Neurology

## 2017-06-18 ENCOUNTER — Other Ambulatory Visit: Payer: Self-pay

## 2017-06-18 ENCOUNTER — Encounter: Payer: Self-pay | Admitting: Neurology

## 2017-06-18 VITALS — BP 118/78 | HR 70 | Wt 196.5 lb

## 2017-06-18 DIAGNOSIS — G43019 Migraine without aura, intractable, without status migrainosus: Secondary | ICD-10-CM | POA: Diagnosis not present

## 2017-06-18 DIAGNOSIS — R269 Unspecified abnormalities of gait and mobility: Secondary | ICD-10-CM

## 2017-06-18 MED ORDER — TOPIRAMATE 50 MG PO TABS: 150.0000 mg | ORAL_TABLET | Freq: Every day | ORAL | 3 refills | Status: DC

## 2017-06-18 NOTE — Patient Instructions (Signed)
   With the Topamax we will begin 100 mg at night for 1 week, then begin 150 mg at night. Call for any dose adjustments.  Topamax (topiramate) is a seizure medication that has an FDA approval for seizures and for migraine headache. Potential side effects of this medication include weight loss, cognitive slowing, tingling in the fingers and toes, and carbonated drinks will taste bad. If any significant side effects are noted on this drug, please contact our office.

## 2017-06-18 NOTE — Progress Notes (Signed)
Reason for visit: Headache  Carrie Burke is an 27 y.o. female  History of present illness:  Carrie Burke is a 27 year old right-handed white female with a history of intractable migraine headache.  The patient has had MRI of the brain showing a left CP angle mass consistent with a lipoma that seems to encase the 7th and 8th cranial nerves.  The patient has some issues with gait instability, she has had stumbles but no falls since last seen.  The patient continues to have virtually daily headaches associated with photophobia, phonophobia, and nausea.  The patient does not miss much work because of the headache.  She has been placed on Topamax taking 75 mg at night but she is not getting much benefit from this.  The patient reports some dizziness at times, she does not believe that this results in any safety issues with driving.  She comes back to this office for an evaluation.  Past Medical History:  Diagnosis Date  . Bipolar disorder ([place])   . Common migraine with intractable migraine 03/18/2017  . Elevated serum glutamic pyruvic transaminase (SGPT) level   . Gait abnormality 03/18/2017  . Gestational diabetes   . History of pre-eclampsia   . Preeclampsia   . Vitamin D deficiency disease     Past Surgical History:  Procedure Laterality Date  . CLAVICLE SURGERY  2014  . TUBAL LIGATION      Family History  Problem Relation Age of Onset  . Arthritis Mother   . Heart disease Father   . Hyperlipidemia Father   . Hypertension Father   . Asthma Brother     Social history:  reports that she has been smoking.  She has been smoking about 1.00 pack per day. She has never used smokeless tobacco. She reports that she drinks alcohol. She reports that she does not use drugs.   No Known Allergies  Medications:  Prior to Admission medications   Medication Sig Start Date End Date Taking? Authorizing Provider  CONTRAVE 8-90 MG TB12 TAKE 2 TABS TWICE DAILY 04/10/17  Yes Volney American, PA-C  cyclobenzaprine (FLEXERIL) 5 MG tablet Take 1 tablet (5 mg total) by mouth 3 (three) times daily as needed for muscle spasms. 07/19/15  Yes Volney American, PA-C  FLUoxetine (PROZAC) 40 MG capsule TAKE 1 CAPSULE BY MOUTH  DAILY 04/04/17  Yes Volney American, PA-C  OLANZapine (ZYPREXA) 15 MG tablet Take 1 tablet (15 mg total) by mouth at bedtime. 02/06/17  Yes Volney American, PA-C  traZODone (DESYREL) 100 MG tablet Take 1 tablet (100 mg total) by mouth at bedtime. 02/06/17  Yes Volney American, PA-C  topiramate (TOPAMAX) 50 MG tablet Take 3 tablets (150 mg total) by mouth at bedtime. 06/18/17   Kathrynn Ducking, MD    ROS:  Out of a complete 14 system review of symptoms, the patient complains only of the following symptoms, and all other reviewed systems are negative.  Headache Dizziness  Blood pressure 118/78, pulse 70, weight 196 lb 8 oz (89.1 kg).  Physical Exam  General: The patient is alert and cooperative at the time of the examination.  The patient is moderately obese.  Skin: No significant peripheral edema is noted.   Neurologic Exam  Mental status: The patient is alert and oriented x 3 at the time of the examination. The patient has apparent normal recent and remote memory, with an apparently normal attention span and concentration ability.   Cranial  nerves: Facial symmetry is present. Speech is normal, no aphasia or dysarthria is noted. Extraocular movements are full, but there is exotropia of the left eye with primary gaze, slight proptosis of the right eye. Visual fields are full.  Motor: The patient has good strength in all 4 extremities.  Sensory examination: Soft touch sensation is symmetric on the face, arms, and legs.  Coordination: The patient has good finger-nose-finger and heel-to-shin bilaterally.  Gait and station: The patient has a normal gait. Tandem gait is minimally unsteady.  Romberg is negative. No drift is  seen.  Reflexes: Deep tendon reflexes are symmetric.   Assessment/Plan:  1.  Intractable migraine headache  2.  Left CP angle lipoma  The lipoma does not result in mass-effect on the cerebellum or brainstem.  It is encased in the 7th and 8th cranial nerve complex and could potentially have something to do with the gait instability issue.  The patient is not responding to low-dose Topamax, this medication will be maximized, the patient will go to 100 mg at night for a week and then go to 150 mg at night.  If she is not getting benefit over the next 3 to 4 weeks, she is to contact our office and we will consider a switch to another medication to help treat the migraine.  She will follow-up in 3 to 4 months.  A prescription for the 50 mg Topamax tablets were sent in.  Jill Alexanders MD 06/18/2017 11:56 AM  [facility] [address] [place], [place] [address]  Phone [phone] Fax [phone]

## 2017-06-19 ENCOUNTER — Telehealth: Payer: Self-pay | Admitting: Neurology

## 2017-06-19 MED ORDER — RIZATRIPTAN BENZOATE 10 MG PO TABS: 10.0000 mg | ORAL_TABLET | Freq: Three times a day (TID) | ORAL | 3 refills | Status: DC | PRN

## 2017-06-19 NOTE — Telephone Encounter (Signed)
Pt calling to inform that re: the dosage of topiramate (TOPAMAX) 50 MG tablet she woke up feeling as if she has been on a roller coaster all day. Pt has been feeling very dizzy all day and she has an even worse head ache.  Pt is asking to be called

## 2017-06-19 NOTE — Telephone Encounter (Signed)
Called and spoke with patient. She took 150mg  of the topamax total last night. These were the directions on her presription. Advised per AVS Dr. Jannifer Franklin wanted her to take 100mg  (2 tablets qhs) for 1 week and then increase to 150mg  (3 tablets qhs). This would give her time to adjust to the new dosage. Her dizziness likely from her taking 3 tablets last night. She will take 2 tablets for one week and then increase.  She also states she has a migraine and has tried taking two tablets excedrin migraine but has gained no relief. She is wondering if there is anything else Dr. Jannifer Franklin can offer to help with current migraine. Advised I will send message to Dr. Jannifer Franklin for her. He is currently with pt. She verbalized understanding.

## 2017-06-19 NOTE — Addendum Note (Signed)
Addended by: Kathrynn Ducking on: 06/19/2017 05:11 PM   Modules accepted: Orders

## 2017-06-19 NOTE — Telephone Encounter (Signed)
The patient apparently did not follow my instructions, she was to go to 100 mg of Topamax at night for a week and then take 150 mg at night.  The patient went directly to the 150 mg dosing.  She will take 100 mg of Topamax tonight.  The patient will be given a prescription for Maxalt to take if needed for her migraine.

## 2017-06-19 NOTE — Telephone Encounter (Signed)
Needs follow up appointment.  

## 2017-06-20 ENCOUNTER — Telehealth: Payer: Self-pay

## 2017-06-20 MED ORDER — FLUOXETINE HCL 40 MG PO CAPS: 40.0000 mg | ORAL_CAPSULE | Freq: Every day | ORAL | 0 refills | Status: DC

## 2017-06-20 NOTE — Telephone Encounter (Signed)
Needs seem further refills

## 2017-06-20 NOTE — Telephone Encounter (Signed)
Pt scheduled for 07/10/17 and would like to have prozac and zyprexa sent to [facility].

## 2017-06-20 NOTE — Telephone Encounter (Signed)
Last Visit 03/24/2017

## 2017-06-22 ENCOUNTER — Other Ambulatory Visit: Payer: Self-pay | Admitting: Family Medicine

## 2017-06-22 MED ORDER — OLANZAPINE 15 MG PO TABS: 15.0000 mg | ORAL_TABLET | Freq: Every day | ORAL | 0 refills | Status: DC

## 2017-06-22 MED ORDER — FLUOXETINE HCL 40 MG PO CAPS: 40.0000 mg | ORAL_CAPSULE | Freq: Every day | ORAL | 0 refills | Status: DC

## 2017-06-27 NOTE — Telephone Encounter (Addendum)
Tried to reach patient to schedule an appointment but unable to reach or leave a message due to no VM set up.  Please schedule appointment if patient returns call.  Thank you

## 2017-06-27 NOTE — Telephone Encounter (Signed)
[name], could you Grimes/d this appt?

## 2017-06-30 ENCOUNTER — Ambulatory Visit: Payer: Self-pay

## 2017-06-30 ENCOUNTER — Telehealth: Payer: Self-pay | Admitting: Neurology

## 2017-06-30 MED ORDER — TOPIRAMATE 50 MG PO TABS: 50.0000 mg | ORAL_TABLET | Freq: Every day | ORAL | 3 refills | Status: DC

## 2017-06-30 MED ORDER — GABAPENTIN 300 MG PO CAPS: ORAL_CAPSULE | ORAL | 3 refills | Status: DC

## 2017-06-30 NOTE — Telephone Encounter (Signed)
Agree with below. Addendum:    I believe that she has limits with moving and including, toileting, bathing, feeding, dressing and grooming. I believe the power wheelchair is needed for pt to be able to perform ADL's in her home

## 2017-06-30 NOTE — Telephone Encounter (Signed)
Phone call from pt.  Reported new onset of intermittent numbness in the back of her head, neck, hands and feet, since Friday. Denied numbness in arms or legs.  Reported that she also has "mild swelling" of base of skull, and post. neck.   Stated she has intermittent weakness in her legs; "they feel like noodles at times." Stated she has been seeing Dr. Jannifer Franklin, Neurologist, for dizziness and headaches.  Reported her headache is "constant."   Reported the dizziness has worsened over past 2 weeks.  Also reported "have some confusion.Marland KitchenMarland KitchenI can't remember things."  Denied speech difficulty, facial droop, or vision loss.  Reported she has intermittent stumbling, due to worsening dizziness; denied any falling recently. Reported she is at work right now; able to speak clearly, and articulate new and worsening symptoms.  Strongly advised, since she is under Neurology's care, to call Dr. Jannifer Franklin' office at this time, and ask to speak to Triage nurse, to report change in symptoms.  Pt. Verb. Understanding.  Will send note to [facility] office, to make aware of new and worsening symptoms.  Agrees with plan.        Reason for Disposition . [1] Numbness (i.e., loss of sensation) of the face, arm / hand, or leg / foot on one side of the body AND [2] sudden onset AND [3] brief (now gone)    Reported new onset since Friday, 6/14, of intermittent numbness of back of head, posterior neck, hands and feet.  Denied numbness of arms or legs.  Answer Assessment - Initial Assessment Questions 1. SYMPTOM: "What is the main symptom you are concerned about?" (e.g., weakness, numbness)     Numbness of back of head, neck, hands and feet; swelling in base of skull and post. Neck area  Since Friday, 6/14 2. ONSET: "When did this start?" (minutes, hours, days; while sleeping)     Friday, 6/14 3. LAST NORMAL: "When was the last time you were normal (no symptoms)?"     Thursday 4. PATTERN "Does this come and go, or has it been constant  since it started?"  "Is it present now?"     intermittent 5. CARDIAC SYMPTOMS: "Have you had any of the following symptoms: chest pain, difficulty breathing, palpitations?"     Denied above cardiac sx's  6. NEUROLOGIC SYMPTOMS: "Have you had any of the following symptoms: headache, dizziness, vision loss, double vision, changes in speech, unsteady on your feet?"     C/o "generalized" weakness; "legs feel like noodles; denied face droop, or vision loss. C/o constant headache.  C/o some confusion where "I can't remember things."  Stated balance difficulty due to worsening dizziness; "stumbles" on occas.   7. OTHER SYMPTOMS: "Do you have any other symptoms?"     Denied  8. PREGNANCY: "Is there any chance you are pregnant?" "When was your last menstrual period?"    LMP was 5/29  Protocols used: NEUROLOGIC DEFICIT-A-AH

## 2017-06-30 NOTE — Telephone Encounter (Signed)
I called the patient.  The patient is still on 100 mg of Topamax but she is reporting numbness and tingling sensations on all 4 extremities and in the back of the head.  She has a sensation of swelling in the neck, no true stiffness.  I suspect this is a side effect of the Topamax, will drop back to 50 mg of Topamax at night, I will start gabapentin taking 300 mg twice daily for 2 weeks and then go to 300 mg in the morning and 600 mg in the evening.

## 2017-06-30 NOTE — Telephone Encounter (Signed)
Pt states that since Friday in the back of her head to the base of her skull there is pain and swelling and  Going down to hands and feet there is tingling.  Pt states she is constantly dizzy and she is having head aches and migraines.  Pt is asking to be called as soon as possible

## 2017-06-30 NOTE — Addendum Note (Signed)
Addended by: Kathrynn Ducking on: 06/30/2017 01:25 PM   Modules accepted: Orders

## 2017-07-04 ENCOUNTER — Ambulatory Visit
Admission: EM | Admit: 2017-07-04 | Discharge: 2017-07-04 | Disposition: A | Discharge status: Home / Self Care | Payer: Managed Care, Other (non HMO) | Attending: Emergency Medicine | Admitting: Emergency Medicine

## 2017-07-04 ENCOUNTER — Other Ambulatory Visit: Payer: Self-pay

## 2017-07-04 ENCOUNTER — Encounter: Payer: Self-pay | Admitting: Gynecology

## 2017-07-04 DIAGNOSIS — R42 Dizziness and giddiness: Secondary | ICD-10-CM

## 2017-07-04 DIAGNOSIS — R531 Weakness: Secondary | ICD-10-CM

## 2017-07-04 DIAGNOSIS — D72829 Elevated white blood cell count, unspecified: Secondary | ICD-10-CM | POA: Diagnosis not present

## 2017-07-04 DIAGNOSIS — R202 Paresthesia of skin: Secondary | ICD-10-CM

## 2017-07-04 LAB — BASIC METABOLIC PANEL
Anion gap: 9 (ref 5–15)
BUN: 11 mg/dL (ref 6–20)
CHLORIDE: 108 mmol/L (ref 101–111)
CO2: 18 mmol/L — AB (ref 22–32)
CREATININE: 0.89 mg/dL (ref 0.44–1.00)
Calcium: 8.5 mg/dL — ABNORMAL LOW (ref 8.9–10.3)
GFR calc Af Amer: >60 mL/min (ref >60)
GFR calc non Af Amer: >60 mL/min (ref >60)
Glucose, Bld: 136 mg/dL — ABNORMAL HIGH (ref 65–99)
Potassium: 3.5 mmol/L (ref 3.5–5.1)
SODIUM: 135 mmol/L (ref 135–145)

## 2017-07-04 LAB — CBC WITH DIFFERENTIAL/PLATELET
BASOS PCT: 1 %
Basophils Absolute: 0.1 10*3/uL (ref 0–0.1)
EOS ABS: 0.2 10*3/uL (ref 0–0.7)
Eosinophils Relative: 2 %
HEMATOCRIT: 42 % (ref 35.0–47.0)
HEMOGLOBIN: 14.2 g/dL (ref 12.0–16.0)
LYMPHS ABS: 2.5 10*3/uL (ref 1.0–3.6)
Lymphocytes Relative: 22 %
MCH: 31.4 pg (ref 26.0–34.0)
MCHC: 33.9 g/dL (ref 32.0–36.0)
MCV: 92.7 fL (ref 80.0–100.0)
MONO ABS: 0.8 10*3/uL (ref 0.2–0.9)
MONOS PCT: 7 %
NEUTROS PCT: 68 %
Neutro Abs: 7.9 10*3/uL — ABNORMAL HIGH (ref 1.4–6.5)
Platelets: 184 10*3/uL (ref 150–440)
RBC: 4.53 MIL/uL (ref 3.80–5.20)
RDW: 13.8 % (ref 11.5–14.5)
WBC: 11.5 10*3/uL — ABNORMAL HIGH (ref 3.6–11.0)

## 2017-07-04 NOTE — Discharge Instructions (Addendum)
Your WBC count is slightly elevated as well as your neutrophils. No distinct reason for elevation. Due to occipital lymphadenopathy, numbness, dizziness, neck pain and left sided lower weakness, recommend further evaluation and testing. Recommend you go to the ER now.

## 2017-07-04 NOTE — ED Triage Notes (Signed)
Patient c/o numbness in head / feet and hands and dizziness. Per patient seen her neurologist on 06/22/2017. Per patient medication change and they added new medication. However per patient not better.

## 2017-07-04 NOTE — ED Provider Notes (Signed)
[facility]    CSN: [id number] Arrival date & time: 07/04/17  1627     History   Chief Complaint Chief Complaint  Patient presents with  . Numbness    HPI Carrie Burke is a 27 y.o. female.   27 year old female presents with dizziness, numbness in occipital area of head for the past week. Also having occipital lymph node tenderness and swelling. Denies any vision changes or facial changes. Denies any nausea or vomiting. Has noticed hands and feet have decreased sensation as well and having an unstable gait. Did have a fever of 102 and headache 2 weeks ago but no fever now. No history of insect or tick bites. No ear pain, sinus congestion/pressure or cough. Has history of migraine headaches, dizziness and numbness in which her PCP ordered a MRI in March 2019 which showed a lipoma but otherwise unremarkable. She was referred to [facility] and they have been trying to adjust medications to help with symptoms. However, symptoms keep getting worse and she is concerned over etiology. She is currently on Contrave, Zyprexa, Prozac, Topamax, Neurontin, and Trazodone daily. She stopped taking Maxalt when she was placed on the Neurontin 5 days ago. She also has not taken Flexeril recently which has helped with headaches in the past. She does smoke cigarettes daily but denies any alcohol or illicit drug use.   The history is provided by the patient.    Past Medical History:  Diagnosis Date  . Bipolar disorder ([place])   . Common migraine with intractable migraine 03/18/2017  . Elevated serum glutamic pyruvic transaminase (SGPT) level   . Gait abnormality 03/18/2017  . Gestational diabetes   . History of pre-eclampsia   . Preeclampsia   . Vitamin D deficiency disease     Patient Active Problem List   Diagnosis Date Noted  . Obesity (BMI 30.0-34.9) 03/26/2017  . Gait abnormality 03/18/2017  . Common migraine with intractable migraine 03/18/2017  . Insomnia  04/09/2015  . Tobacco abuse 03/22/2015  . Pain in right shoulder 02/27/2015  . Vitamin D deficiency disease   . Bipolar disorder [facility])     Past Surgical History:  Procedure Laterality Date  . CLAVICLE SURGERY  2014  . TUBAL LIGATION      OB History   None      Home Medications    Prior to Admission medications   Medication Sig Start Date End Date Taking? Authorizing Provider  CONTRAVE 8-90 MG TB12 TAKE 2 TABS TWICE DAILY 04/10/17  Yes Volney American, PA-C  cyclobenzaprine (FLEXERIL) 5 MG tablet Take 1 tablet (5 mg total) by mouth 3 (three) times daily as needed for muscle spasms. 07/19/15  Yes Volney American, PA-C  FLUoxetine (PROZAC) 40 MG capsule Take 1 capsule (40 mg total) by mouth daily. 06/22/17  Yes Johnson, Megan P, DO  gabapentin (NEURONTIN) 300 MG capsule 1 capsule twice daily for 2 weeks and then take 1 in the morning and 2 in the evening 06/30/17  Yes Kathrynn Ducking, MD  OLANZapine (ZYPREXA) 15 MG tablet Take 1 tablet (15 mg total) by mouth at bedtime. 06/22/17  Yes Johnson, Megan P, DO  rizatriptan (MAXALT) 10 MG tablet Take 1 tablet (10 mg total) by mouth 3 (three) times daily as needed for migraine. 06/19/17  Yes Kathrynn Ducking, MD  topiramate (TOPAMAX) 50 MG tablet Take 1 tablet (50 mg total) by mouth at bedtime. 06/30/17  Yes Kathrynn Ducking, MD  traZODone (  DESYREL) 100 MG tablet Take 1 tablet (100 mg total) by mouth at bedtime. 02/06/17  Yes Volney American, PA-C    Family History Family History  Problem Relation Age of Onset  . Arthritis Mother   . Heart disease Father   . Hyperlipidemia Father   . Hypertension Father   . Asthma Brother     Social History Social History   Tobacco Use  . Smoking status: Current Every Day Smoker    Packs/day: 1.00  . Smokeless tobacco: Never Used  Substance Use Topics  . Alcohol use: Yes    Comment: Occasionally  . Drug use: No     Allergies   Patient has no known allergies.   Review of  Systems Review of Systems  Constitutional: Positive for fatigue and fever (2 weeks ago but none now). Negative for activity change, appetite change and chills.  HENT: Negative for congestion, ear discharge, ear pain, facial swelling, hearing loss, nosebleeds, postnasal drip, rhinorrhea, sinus pressure, sinus pain, sneezing, sore throat, tinnitus and trouble swallowing.   Eyes: Positive for photophobia. Negative for pain, discharge, redness, itching and visual disturbance.  Respiratory: Negative for cough, chest tightness, shortness of breath and wheezing.   Gastrointestinal: Negative for nausea and vomiting.  Musculoskeletal: Positive for neck pain. Negative for arthralgias, myalgias and neck stiffness.  Skin: Negative for color change, rash and wound.  Neurological: Positive for dizziness, weakness, numbness and headaches. Negative for tremors, seizures, syncope, facial asymmetry, speech difficulty and light-headedness.  Hematological: Positive for adenopathy. Does not bruise/bleed easily.  Psychiatric/Behavioral: Positive for dysphoric mood and sleep disturbance.     Physical Exam Triage Vital Signs ED Triage Vitals  Enc Vitals Group     BP 07/04/17 1650 132/83     Pulse Rate 07/04/17 1650 75     Resp 07/04/17 1650 16     Temp 07/04/17 1650 98.7 F (37.1 C)     Temp Source 07/04/17 1650 Oral     SpO2 07/04/17 1650 99 %     Weight 07/04/17 1651 196 lb (88.9 kg)     Height --      Head Circumference --      Peak Flow --      Pain Score 07/04/17 1651 9     Pain Loc --      Pain Edu? --      Excl. in [place]? --    No data found.  Updated Vital Signs BP 132/83   Pulse 75   Temp 98.7 F (37.1 C) (Oral)   Resp 16   Wt 196 lb (88.9 kg)   LMP 06/13/2017   SpO2 99%   BMI 30.70 kg/m   Visual Acuity Right Eye Distance:   Left Eye Distance:   Bilateral Distance:    Right Eye Near:   Left Eye Near:    Bilateral Near:     Physical Exam  Constitutional: She is oriented to  person, place, and time. She appears well-developed and well-nourished. No distress.  HENT:  Head: Normocephalic and atraumatic.    Right Ear: Hearing, tympanic membrane, external ear and ear canal normal.  Left Ear: Hearing, tympanic membrane, external ear and ear canal normal.  Nose: Nose normal.  Mouth/Throat: Uvula is midline, oropharynx is clear and moist and mucous membranes are normal.  Bilateral occipital lymphadenopathy and tenderness. Also having decreased sensation from mid-occipital to neck bilaterally. No redness or lesions present.   Eyes: Pupils are equal, round, and reactive to light. Conjunctivae and  lids are normal.  Neck: Normal range of motion. Neck supple.  Cardiovascular: Normal rate, regular rhythm and normal heart sounds.  No murmur heard. Pulmonary/Chest: Effort normal. No respiratory distress. She has no decreased breath sounds. She has no wheezes. She has no rhonchi. She has no rales.  Musculoskeletal: Normal range of motion.  Lymphadenopathy:       Head (right side): Occipital adenopathy present. No submental, no submandibular, no tonsillar, no preauricular and no posterior auricular adenopathy present.       Head (left side): Occipital adenopathy present. No submental, no submandibular, no tonsillar, no preauricular and no posterior auricular adenopathy present.       Right cervical: No superficial cervical, no deep cervical and no posterior cervical adenopathy present.      Left cervical: No superficial cervical and no deep cervical adenopathy present.  Neurological: She is alert and oriented to person, place, and time. She has normal strength and normal reflexes. She displays no tremor. A sensory deficit is present. She displays no seizure activity. Coordination and gait abnormal.  Unable to perform toe-heel walk. Weakness noted on left foot and leg.  Decreased sensation of entire posterior aspect of her neck.   Skin: Skin is warm and dry. Capillary refill takes  less than 2 seconds. No rash noted. No erythema.  Psychiatric: Her speech is normal and behavior is normal. Thought content normal. Her mood appears anxious.  Vitals reviewed.    UC Treatments / Results  Labs (all labs ordered are listed, but only abnormal results are displayed) Labs Reviewed  CBC WITH DIFFERENTIAL/PLATELET - Abnormal; Notable for the following components:      Result Value   WBC 11.5 (*)    Neutro Abs 7.9 (*)    All other components within normal limits  BASIC METABOLIC PANEL - Abnormal; Notable for the following components:   CO2 18 (*)    Glucose, Bld 136 (*)    Calcium 8.5 (*)    All other components within normal limits    EKG None  Radiology No results found.  Procedures Procedures (including critical care time)  Medications Ordered in UC Medications - No data to display  Initial Impression / Assessment and Plan / UC Course  I have reviewed the triage vital signs and the nursing notes.  Pertinent labs & imaging results that were available during my care of the patient were reviewed by me and considered in my medical decision making (see chart for details).    Discussed CBC results with patient. Discussed concerned about elevated WBC and unknown cause. Due to left lower side weakness, dizziness, headache and worsening of symptoms, recommend patient be further evaluated with additional testing and possible repeat of MRI. Patient desires to go to [facility] ER due to closer location than [place]. Recommend go to the ER now for further evaluation. Her brother will drive her there now. Final Clinical Impressions(s) / UC Diagnoses   Final diagnoses:  Dizziness  Leukocytosis, unspecified type  Paresthesia  Left-sided weakness     Discharge Instructions     Your WBC count is slightly elevated as well as your neutrophils. No distinct reason for elevation. Due to occipital lymphadenopathy, numbness, dizziness, neck pain and left sided lower  weakness, recommend further evaluation and testing. Recommend you go to the ER now.     ED Prescriptions    None     Controlled Substance Prescriptions [place] Controlled Substance Registry consulted? Not Applicable   Katy Apo, NP 07/04/17  1828  

## 2017-07-10 ENCOUNTER — Encounter: Payer: Self-pay | Admitting: Family Medicine

## 2017-07-10 ENCOUNTER — Ambulatory Visit: Payer: Managed Care, Other (non HMO) | Admitting: Family Medicine

## 2017-07-10 VITALS — BP 115/77 | HR 80 | Wt 202.0 lb

## 2017-07-10 DIAGNOSIS — R269 Unspecified abnormalities of gait and mobility: Secondary | ICD-10-CM

## 2017-07-10 DIAGNOSIS — G43019 Migraine without aura, intractable, without status migrainosus: Secondary | ICD-10-CM

## 2017-07-10 DIAGNOSIS — D72829 Elevated white blood cell count, unspecified: Secondary | ICD-10-CM | POA: Diagnosis not present

## 2017-07-10 DIAGNOSIS — E669 Obesity, unspecified: Secondary | ICD-10-CM

## 2017-07-10 MED ORDER — NALTREXONE-BUPROPION HCL ER 8-90 MG PO TB12: ORAL_TABLET | ORAL | 0 refills | Status: DC

## 2017-07-10 NOTE — Progress Notes (Signed)
BP 115/77   Pulse 80   Wt 202 lb (91.6 kg)   LMP 06/13/2017   SpO2 97%   BMI 31.64 kg/m    Subjective:    Patient ID: Carrie Burke, female    DOB: 05/20/1990, 27 y.o.   MRN: [id number]  HPI: Carrie Burke is a 27 y.o. female  Chief Complaint  Patient presents with  . Weight Gain  . ER follow up    Head numbness, Legs feel like Jello  . Medication Reaction    Wants to know if she can still take Maxalt   WEIGHT GAIN Duration: chronic Previous attempts at weight loss: yes Complications of obesity: none  Peak weight: current Weight loss goal: to be healthy Weight loss to date: None. Has gained 6lbs Requesting obesity pharmacotherapy: yes Current weight loss supplements/medications: yes  ER FOLLOW UP- went to the urgent care 6 days ago. Was having dizziness, weakness and decreased sensation of her legs Per UC note: "Due to left lower side weakness, dizziness, headache and worsening of symptoms, recommend patient be further evaluated with additional testing and possible repeat of MRI." Patient was advised to go to ER and stated that she was going to go to [facility] ED. She went to ER on Friday and they put her on amoxicillin. She notes that she is feeling better.   Time since discharge: 6 days Hospital/facility: [facility] Diagnosis: Vertigo.  Procedures/tests: blood work Consultants: None New medications: Amoxicillin Discharge instructions:  Follow up here Status: better   Relevant past medical, surgical, family and social history reviewed and updated as indicated. Interim medical history since our last visit reviewed. Allergies and medications reviewed and updated.  Review of Systems  Constitutional: Negative.   Respiratory: Negative.   Cardiovascular: Negative.   Neurological: Positive for dizziness, light-headedness and headaches. Negative for tremors, seizures, syncope, facial asymmetry, speech difficulty, weakness and  numbness.  Psychiatric/Behavioral: Negative.     Per HPI unless specifically indicated above     Objective:    BP 115/77   Pulse 80   Wt 202 lb (91.6 kg)   LMP 06/13/2017   SpO2 97%   BMI 31.64 kg/m   Wt Readings from Last 3 Encounters:  07/10/17 202 lb (91.6 kg)  07/04/17 196 lb (88.9 kg)  06/18/17 196 lb 8 oz (89.1 kg)    Physical Exam  Constitutional: She is oriented to person, place, and time. She appears well-developed and well-nourished. No distress.  HENT:  Head: Normocephalic and atraumatic.  Right Ear: Hearing normal.  Left Ear: Hearing normal.  Nose: Nose normal.  Eyes: Conjunctivae and lids are normal. Right eye exhibits no discharge. Left eye exhibits no discharge. No scleral icterus.  Cardiovascular: Normal rate, regular rhythm, normal heart sounds and intact distal pulses. Exam reveals no gallop and no friction rub.  No murmur heard. Pulmonary/Chest: Effort normal and breath sounds normal. No stridor. No respiratory distress. She has no wheezes. She has no rales. She exhibits no tenderness.  Musculoskeletal: Normal range of motion.  Neurological: She is alert and oriented to person, place, and time.  Skin: Skin is warm, dry and intact. Capillary refill takes less than 2 seconds. No rash noted. She is not diaphoretic. No erythema. No pallor.  Psychiatric: She has a normal mood and affect. Her speech is normal and behavior is normal. Judgment and thought content normal. Cognition and memory are normal.  Nursing note and vitals reviewed.   Results for orders placed or  performed during the hospital encounter of 07/04/17  CBC with Differential  Result Value Ref Range   WBC 11.5 (H) 3.6 - 11.0 K/uL   RBC 4.53 3.80 - 5.20 MIL/uL   Hemoglobin 14.2 12.0 - 16.0 g/dL   HCT 42.0 35.0 - 47.0 %   MCV 92.7 80.0 - 100.0 fL   MCH 31.4 26.0 - 34.0 pg   MCHC 33.9 32.0 - 36.0 g/dL   RDW 13.8 11.5 - 14.5 %   Platelets 184 150 - 440 K/uL   Neutrophils Relative % 68 %    Neutro Abs 7.9 (H) 1.4 - 6.5 K/uL   Lymphocytes Relative 22 %   Lymphs Abs 2.5 1.0 - 3.6 K/uL   Monocytes Relative 7 %   Monocytes Absolute 0.8 0.2 - 0.9 K/uL   Eosinophils Relative 2 %   Eosinophils Absolute 0.2 0 - 0.7 K/uL   Basophils Relative 1 %   Basophils Absolute 0.1 0 - 0.1 K/uL  Basic metabolic panel  Result Value Ref Range   Sodium 135 135 - 145 mmol/L   Potassium 3.5 3.5 - 5.1 mmol/L   Chloride 108 101 - 111 mmol/L   CO2 18 (L) 22 - 32 mmol/L   Glucose, Bld 136 (H) 65 - 99 mg/dL   BUN 11 6 - 20 mg/dL   Creatinine, Ser 0.89 0.44 - 1.00 mg/dL   Calcium 8.5 (L) 8.9 - 10.3 mg/dL   GFR calc non Af Amer >60 >60 mL/min   GFR calc Af Amer >60 >60 mL/min   Anion gap 9 5 - 15      Assessment & Plan:   Problem List Items Addressed This Visit      Cardiovascular and Mediastinum   Common migraine with intractable migraine    Will try to get her into see neurology sooner than October. Call made to them.         Other   Gait abnormality    Will try to get her into see neurology sooner than October. Call made to them.       Obesity (BMI 30.0-34.9) - Primary    Contrave not working. Will wean off. Will check her insurance to see if saxenda is paid for and if it is, will send through. Call with any concerns.        Other Visit Diagnoses    Leukocytosis, unspecified type       Resolved on recheck. Call with any concerns.    Relevant Orders   CBC With Differential/Platelet       Follow up plan: Return in about 1 month (around 08/07/2017) for after starting saxenda if starts it.

## 2017-07-10 NOTE — Patient Instructions (Signed)
Liraglutide injection (Weight Management) What is this medicine? LIRAGLUTIDE (LIR a GLOO tide) is used with a reduced calorie diet and exercise to help you lose weight. This medicine may be used for other purposes; ask your health care provider or pharmacist if you have questions. COMMON BRAND NAME(S): Saxenda What should I tell my health care provider before I take this medicine? They need to know if you have any of these conditions: -endocrine tumors (MEN 2) or if someone in your family had these tumors -gallbladder disease -high cholesterol -history of alcohol abuse problem -history of pancreatitis -kidney disease or if you are on dialysis -liver disease -previous swelling of the tongue, face, or lips with difficulty breathing, difficulty swallowing, hoarseness, or tightening of the throat -stomach problems -suicidal thoughts, plans, or attempt; a previous suicide attempt by you or a family member -thyroid cancer or if someone in your family had thyroid cancer -an unusual or allergic reaction to liraglutide, other medicines, foods, dyes, or preservatives -pregnant or trying to get pregnant -breast-feeding How should I use this medicine? This medicine is for injection under the skin of your upper leg, stomach area, or upper arm. You will be taught how to prepare and give this medicine. Use exactly as directed. Take your medicine at regular intervals. Do not take it more often than directed. It is important that you put your used needles and syringes in a special sharps container. Do not put them in a trash can. If you do not have a sharps container, call your pharmacist or healthcare provider to get one. A special MedGuide will be given to you by the pharmacist with each prescription and refill. Be sure to read this information carefully each time. Talk to your pediatrician regarding the use of this medicine in children. Special care may be needed. Overdosage: If you think you have taken  too much of this medicine contact a poison control center or emergency room at once. NOTE: This medicine is only for you. Do not share this medicine with others. What if I miss a dose? If you miss a dose, take it as soon as you can. If it is almost time for your next dose, take only that dose. Do not take double or extra doses. If you miss your dose for 3 days or more, call your doctor or health care professional to talk about how to restart this medicine. What may interact with this medicine? -insulin and other medicines for diabetes This list may not describe all possible interactions. Give your health care provider a list of all the medicines, herbs, non-prescription drugs, or dietary supplements you use. Also tell them if you smoke, drink alcohol, or use illegal drugs. Some items may interact with your medicine. What should I watch for while using this medicine? Visit your doctor or health care professional for regular checks on your progress. This medicine is intended to be used in addition to a healthy diet and appropriate exercise. The best results are achieved this way. Do not increase or in any way change your dose without consulting your doctor or health care professional. Drink plenty of fluids while taking this medicine. Check with your doctor or health care professional if you get an attack of severe diarrhea, nausea, and vomiting. The loss of too much body fluid can make it dangerous for you to take this medicine. This medicine may affect blood sugar levels. If you have diabetes, check with your doctor or health care professional before you change your diet  or the dose of your diabetic medicine. Patients and their families should watch out for worsening depression or thoughts of suicide. Also watch out for sudden changes in feelings such as feeling anxious, agitated, panicky, irritable, hostile, aggressive, impulsive, severely restless, overly excited and hyperactive, or not being able to  sleep. If this happens, especially at the beginning of treatment or after a change in dose, call your health care professional. What side effects may I notice from receiving this medicine? Side effects that you should report to your doctor or health care professional as soon as possible: -allergic reactions like skin rash, itching or hives, swelling of the face, lips, or tongue -breathing problems -diarrhea that continues or is severe -lump or swelling on the neck -severe nausea -signs and symptoms of infection like fever or chills; cough; sore throat; pain or trouble passing urine -signs and symptoms of low blood sugar such as feeling anxious, confusion, dizziness, increased hunger, unusually weak or tired, sweating, shakiness, cold, irritable, headache, blurred vision, fast heartbeat, loss of consciousness -signs and symptoms of kidney injury like trouble passing urine or change in the amount of urine -trouble swallowing -unusual stomach upset or pain -vomiting Side effects that usually do not require medical attention (report to your doctor or health care professional if they continue or are bothersome): -constipation -decreased appetite -diarrhea -fatigue -headache -nausea -pain, redness, or irritation at site where injected -stomach upset -stuffy or runny nose This list may not describe all possible side effects. Call your doctor for medical advice about side effects. You may report side effects to FDA at 1-800-FDA-1088. Where should I keep my medicine? Keep out of the reach of children. Store unopened pen in a refrigerator between 2 and 8 degrees C (36 and 46 degrees F). Do not freeze or use if the medicine has been frozen. Protect from light and excessive heat. After you first use the pen, it can be stored at room temperature between 15 and 30 degrees C (59 and 86 degrees F) or in a refrigerator. Throw away your used pen after 30 days or after the expiration date, whichever comes  first. Do not store your pen with the needle attached. If the needle is left on, medicine may leak from the pen. NOTE: This sheet is a summary. It may not cover all possible information. If you have questions about this medicine, talk to your doctor, pharmacist, or health care provider.  2018 Elsevier/Gold Standard (2016-01-18 14:41:37) Liraglutide injection (Weight Management) What is this medicine? LIRAGLUTIDE (LIR a GLOO tide) is used with a reduced calorie diet and exercise to help you lose weight. This medicine may be used for other purposes; ask your health care provider or pharmacist if you have questions. COMMON BRAND NAME(S): Saxenda What should I tell my health care provider before I take this medicine? They need to know if you have any of these conditions: -endocrine tumors (MEN 2) or if someone in your family had these tumors -gallbladder disease -high cholesterol -history of alcohol abuse problem -history of pancreatitis -kidney disease or if you are on dialysis -liver disease -previous swelling of the tongue, face, or lips with difficulty breathing, difficulty swallowing, hoarseness, or tightening of the throat -stomach problems -suicidal thoughts, plans, or attempt; a previous suicide attempt by you or a family member -thyroid cancer or if someone in your family had thyroid cancer -an unusual or allergic reaction to liraglutide, other medicines, foods, dyes, or preservatives -pregnant or trying to get pregnant -breast-feeding How should  I use this medicine? This medicine is for injection under the skin of your upper leg, stomach area, or upper arm. You will be taught how to prepare and give this medicine. Use exactly as directed. Take your medicine at regular intervals. Do not take it more often than directed. It is important that you put your used needles and syringes in a special sharps container. Do not put them in a trash can. If you do not have a sharps container, call  your pharmacist or healthcare provider to get one. A special MedGuide will be given to you by the pharmacist with each prescription and refill. Be sure to read this information carefully each time. Talk to your pediatrician regarding the use of this medicine in children. Special care may be needed. Overdosage: If you think you have taken too much of this medicine contact a poison control center or emergency room at once. NOTE: This medicine is only for you. Do not share this medicine with others. What if I miss a dose? If you miss a dose, take it as soon as you can. If it is almost time for your next dose, take only that dose. Do not take double or extra doses. If you miss your dose for 3 days or more, call your doctor or health care professional to talk about how to restart this medicine. What may interact with this medicine? -insulin and other medicines for diabetes This list may not describe all possible interactions. Give your health care provider a list of all the medicines, herbs, non-prescription drugs, or dietary supplements you use. Also tell them if you smoke, drink alcohol, or use illegal drugs. Some items may interact with your medicine. What should I watch for while using this medicine? Visit your doctor or health care professional for regular checks on your progress. This medicine is intended to be used in addition to a healthy diet and appropriate exercise. The best results are achieved this way. Do not increase or in any way change your dose without consulting your doctor or health care professional. Drink plenty of fluids while taking this medicine. Check with your doctor or health care professional if you get an attack of severe diarrhea, nausea, and vomiting. The loss of too much body fluid can make it dangerous for you to take this medicine. This medicine may affect blood sugar levels. If you have diabetes, check with your doctor or health care professional before you change your  diet or the dose of your diabetic medicine. Patients and their families should watch out for worsening depression or thoughts of suicide. Also watch out for sudden changes in feelings such as feeling anxious, agitated, panicky, irritable, hostile, aggressive, impulsive, severely restless, overly excited and hyperactive, or not being able to sleep. If this happens, especially at the beginning of treatment or after a change in dose, call your health care professional. What side effects may I notice from receiving this medicine? Side effects that you should report to your doctor or health care professional as soon as possible: -allergic reactions like skin rash, itching or hives, swelling of the face, lips, or tongue -breathing problems -diarrhea that continues or is severe -lump or swelling on the neck -severe nausea -signs and symptoms of infection like fever or chills; cough; sore throat; pain or trouble passing urine -signs and symptoms of low blood sugar such as feeling anxious, confusion, dizziness, increased hunger, unusually weak or tired, sweating, shakiness, cold, irritable, headache, blurred vision, fast heartbeat, loss of consciousness -  signs and symptoms of kidney injury like trouble passing urine or change in the amount of urine -trouble swallowing -unusual stomach upset or pain -vomiting Side effects that usually do not require medical attention (report to your doctor or health care professional if they continue or are bothersome): -constipation -decreased appetite -diarrhea -fatigue -headache -nausea -pain, redness, or irritation at site where injected -stomach upset -stuffy or runny nose This list may not describe all possible side effects. Call your doctor for medical advice about side effects. You may report side effects to FDA at 1-800-FDA-1088. Where should I keep my medicine? Keep out of the reach of children. Store unopened pen in a refrigerator between 2 and 8 degrees  C (36 and 46 degrees F). Do not freeze or use if the medicine has been frozen. Protect from light and excessive heat. After you first use the pen, it can be stored at room temperature between 15 and 30 degrees C (59 and 86 degrees F) or in a refrigerator. Throw away your used pen after 30 days or after the expiration date, whichever comes first. Do not store your pen with the needle attached. If the needle is left on, medicine may leak from the pen. NOTE: This sheet is a summary. It may not cover all possible information. If you have questions about this medicine, talk to your doctor, pharmacist, or health care provider.  2018 Elsevier/Gold Standard (2016-01-18 14:41:37)

## 2017-07-10 NOTE — Assessment & Plan Note (Signed)
Will try to get her into see neurology sooner than October. Call made to them.

## 2017-07-10 NOTE — Assessment & Plan Note (Signed)
Contrave not working. Will wean off. Will check her insurance to see if saxenda is paid for and if it is, will send through. Call with any concerns.

## 2017-07-11 LAB — CBC WITH DIFFERENTIAL/PLATELET
HEMATOCRIT: 39.7 % (ref 34.0–46.6)
Hemoglobin: 14 g/dL (ref 11.1–15.9)
Lymphocytes Absolute: 2.4 10*3/uL (ref 0.7–3.1)
Lymphs: 24 %
MCH: 33.2 pg — ABNORMAL HIGH (ref 26.6–33.0)
MCHC: 35.3 g/dL (ref 31.5–35.7)
MCV: 94 fL (ref 79–97)
MID (ABSOLUTE): 0.9 10*3/uL (ref 0.1–1.6)
MID: 9 %
Neutrophils Absolute: 7 10*3/uL (ref 1.4–7.0)
Neutrophils: 67 %
Platelets: 175 10*3/uL (ref 150–450)
RBC: 4.22 x10E6/uL (ref 3.77–5.28)
RDW: 13.6 % (ref 12.3–15.4)
WBC: 10.3 10*3/uL (ref 3.4–10.8)

## 2017-07-12 ENCOUNTER — Other Ambulatory Visit: Payer: Self-pay | Admitting: Family Medicine

## 2017-07-14 NOTE — Telephone Encounter (Signed)
Trazodone  refill Last Refill:02/06/17 #90 with 1 refill Last OV: 07/10/17 PCP: [place]: Carrie Burke

## 2017-07-20 ENCOUNTER — Other Ambulatory Visit: Payer: Self-pay | Admitting: Family Medicine

## 2017-08-19 ENCOUNTER — Telehealth: Payer: Self-pay | Admitting: *Deleted

## 2017-08-19 NOTE — Telephone Encounter (Signed)
Called pt to offer appt tomorrow since she was on wait list. She declined stating she is seeing physician at [facility] clinic today instead. Declined appt. She will call back if she would like to f/u with [place].

## 2017-08-21 NOTE — Telephone Encounter (Signed)
Pt has called back stating she is returning the call to [employer].  Pt would like a call back

## 2017-08-21 NOTE — Telephone Encounter (Signed)
I called pt back. She states she did not f/u with [facility]. She felt her sx were not addressed. Her dizziness is worse and she fell a couple weeks ago and hurt her ankle. She denies hitting her head or any significant injuries. I made f/u appt with Dr. Jannifer Franklin on 08/28/17 at 26am, check in 700am. She verbalized understanding and appreciation.

## 2017-08-25 ENCOUNTER — Other Ambulatory Visit: Payer: Self-pay | Admitting: Neurology

## 2017-08-25 DIAGNOSIS — D170 Benign lipomatous neoplasm of skin and subcutaneous tissue of head, face and neck: Secondary | ICD-10-CM

## 2017-08-28 ENCOUNTER — Ambulatory Visit: Payer: Managed Care, Other (non HMO) | Admitting: Neurology

## 2017-08-28 ENCOUNTER — Encounter: Payer: Self-pay | Admitting: Neurology

## 2017-08-28 VITALS — BP 135/87 | HR 72 | Ht 67.0 in | Wt 208.5 lb

## 2017-08-28 DIAGNOSIS — G43019 Migraine without aura, intractable, without status migrainosus: Secondary | ICD-10-CM

## 2017-08-28 MED ORDER — BACLOFEN 10 MG PO TABS: 5.0000 mg | ORAL_TABLET | Freq: Two times a day (BID) | ORAL | 3 refills | Status: DC

## 2017-08-28 MED ORDER — RIZATRIPTAN BENZOATE 10 MG PO TABS: 10.0000 mg | ORAL_TABLET | Freq: Three times a day (TID) | ORAL | 3 refills | Status: DC | PRN

## 2017-08-28 MED ORDER — GABAPENTIN 300 MG PO CAPS: ORAL_CAPSULE | ORAL | 3 refills | Status: DC

## 2017-08-28 NOTE — Patient Instructions (Signed)
We will increase the gabapentin to one in the morning and 3 in the evening.  Start baclofen 10 mg taking 1/2 tablet twice a day, call for any dose adjustments.  Stop the Topamax.

## 2017-08-28 NOTE — Progress Notes (Signed)
Reason for visit: Migraine headache  Carrie Burke is an 27 y.o. female  History of present illness:  Carrie Burke is a 27 year old right-handed white female with a history of bipolar disorder and intractable migraine headache.  MRI of the brain has shown a left CP angle mass consistent with lipoma that is encasing the 7th and 8th cranial nerves.  The mass does not impinge upon the brainstem.  The patient continues to have ongoing virtually daily headaches.  She was given a trial on Topamax but did not seem to tolerate the medication well, she is now on 50 mg at night but the medication has not offered much benefit with her headaches.  She was placed on gabapentin taking 300 mg in the morning and 600 mg in the evening, she is having some drowsiness on the medication.  She still has not gained much benefit with her headache.  She is not mentioning much work from due to the headache, she does have some slight nausea but this is not significant.  She takes Maxalt for the headache with some improvement.  The patient drinks 1 cup of coffee in the day, one energy drink, and 1 caffeinated soft drink.  The patient continues to have ongoing problems with gait instability, she has fallen on one occasion 2 to 3 weeks ago.  She returns for an evaluation.  Past Medical History:  Diagnosis Date  . Bipolar disorder ([place])   . Common migraine with intractable migraine 03/18/2017  . Elevated serum glutamic pyruvic transaminase (SGPT) level   . Gait abnormality 03/18/2017  . Gestational diabetes   . High-risk pregnancy 01/24/2014  . History of pre-eclampsia   . History of pre-eclampsia in prior pregnancy, currently pregnant in third trimester 01/24/2014  . Polyhydramnios in third trimester 02/11/2014  . Preeclampsia   . Vitamin D deficiency disease     Past Surgical History:  Procedure Laterality Date  . CLAVICLE SURGERY  2014  . TUBAL LIGATION      Family History  Problem Relation Age of Onset  .  Arthritis Mother   . Heart disease Father   . Hyperlipidemia Father   . Hypertension Father   . Asthma Brother     Social history:  reports that she has been smoking. She has been smoking about 1.00 pack per day. She has never used smokeless tobacco. She reports that she drinks alcohol. She reports that she does not use drugs.   No Known Allergies  Medications:  Prior to Admission medications   Medication Sig Start Date End Date Taking? Authorizing Provider  cyclobenzaprine (FLEXERIL) 5 MG tablet Take 1 tablet (5 mg total) by mouth 3 (three) times daily as needed for muscle spasms. 07/19/15  Yes Volney American, PA-C  FLUoxetine (PROZAC) 40 MG capsule Take 1 capsule (40 mg total) by mouth daily. 06/22/17  Yes Johnson, Megan P, DO  gabapentin (NEURONTIN) 300 MG capsule 1 capsule in the morning and 3 in the evening 08/28/17  Yes Kathrynn Ducking, MD  Naltrexone-buPROPion HCl ER (CONTRAVE) 8-90 MG TB12 2 tabs in AM and 1 tab in the PM for 1 week, then 1 tab BID for 1 week, then 1 tab in AM for 1 week, then stop 07/10/17  Yes Johnson, Megan P, DO  OLANZapine (ZYPREXA) 15 MG tablet TAKE 1 TABLET BY MOUTH EVERYDAY AT BEDTIME 07/21/17  Yes Johnson, Megan P, DO  rizatriptan (MAXALT) 10 MG tablet Take 1 tablet (10 mg total) by mouth 3 (  three) times daily as needed for migraine. 08/28/17  Yes Kathrynn Ducking, MD  traZODone (DESYREL) 100 MG tablet TAKE 1 TABLET BY MOUTH AT  BEDTIME 07/14/17  Yes Volney American, PA-C  baclofen (LIORESAL) 10 MG tablet Take 0.5 tablets (5 mg total) by mouth 2 (two) times daily. 08/28/17   Kathrynn Ducking, MD    ROS:  Out of a complete 14 system review of symptoms, the patient complains only of the following symptoms, and all other reviewed systems are negative.  Balance problems Headaches  Blood pressure 135/87, pulse 72, height 5\' 7"  (1.702 m), weight 208 lb 8 oz (94.6 kg).  Physical Exam  General: The patient is alert and cooperative at the time of the  examination.  The patient is moderately obese.  Skin: No significant peripheral edema is noted.   Neurologic Exam  Mental status: The patient is alert and oriented x 3 at the time of the examination. The patient has apparent normal recent and remote memory, with an apparently normal attention span and concentration ability.   Cranial nerves: Facial symmetry is present. Speech is normal, no aphasia or dysarthria is noted. Extraocular movements are full. Visual fields are full.  With lateral gaze, the patient has prominent horizontal nystagmus bilaterally that is end-gaze  Motor: The patient has good strength in all 4 extremities.  Sensory examination: Soft touch sensation is symmetric on the face, arms, and legs.  Coordination: The patient has good finger-nose-finger and heel-to-shin bilaterally.  Gait and station: The patient has a normal gait. Tandem gait is slightly unsteady. Romberg is negative. No drift is seen.  Reflexes: Deep tendon reflexes are symmetric.   Assessment/Plan:  1.  Intractable migraine headache  2.  Gait disturbance  3.  Left CP angle lipoma  The patient is still having ongoing headaches.  We will go up on the gabapentin taking 300 mg in the morning and 900 mg in the evening.  She does not believe that she can increase the daytime doses secondary to drowsiness.  The patient will be placed on low-dose baclofen taking 5 mg twice daily, she will call for dose adjustments.  She will stop the Topamax.  I have recommended physical therapy for her gait, she is not sure she can take off time from work but she will talk to her HR representative.  She does have FMLA.  She will call me regarding this.  She will follow-up in 3 months.  We will consider medication such as Aimovig or Ajovy in the future.  The patient is not an adequate candidate for another antidepressant medication, she is already on 2 antidepressants now.  She cannot take beta-blockers as she has a history of  bipolar disorder with depression, she is not a candidate for Depakote as she is a young female with a potential for pregnancy.  Botox therapy in the future may be indicated.  Jill Alexanders MD 08/28/2017 7:56 AM  [facility] [address] [place], [place] [address]  Phone [phone] Fax [phone]

## 2017-08-29 ENCOUNTER — Other Ambulatory Visit: Payer: Self-pay | Admitting: Family Medicine

## 2017-09-01 ENCOUNTER — Ambulatory Visit
Admission: EM | Admit: 2017-09-01 | Discharge: 2017-09-01 | Disposition: A | Discharge status: Home / Self Care | Payer: Managed Care, Other (non HMO) | Attending: Internal Medicine | Admitting: Internal Medicine

## 2017-09-01 ENCOUNTER — Ambulatory Visit: Payer: Self-pay | Admitting: Family Medicine

## 2017-09-01 ENCOUNTER — Other Ambulatory Visit: Payer: Self-pay

## 2017-09-01 ENCOUNTER — Encounter: Payer: Self-pay | Admitting: Emergency Medicine

## 2017-09-01 DIAGNOSIS — I10 Essential (primary) hypertension: Secondary | ICD-10-CM

## 2017-09-01 MED ORDER — CLONIDINE HCL 0.1 MG PO TABS: 0.1000 mg | ORAL_TABLET | Freq: Two times a day (BID) | ORAL | 0 refills | Status: DC | PRN

## 2017-09-01 NOTE — ED Provider Notes (Signed)
[facility]    CSN: [id number] Arrival date & time: 09/01/17  1638     History   Chief Complaint Chief Complaint  Patient presents with  . Hypertension  . Headache    HPI Carrie Burke is a 27 y.o. female.   Patient is a 27 year old female who presents with complaint of elevated blood pressure intermittently that has been symptomatic.  Patient states on Friday she was with her mother-in-law who told the patient that she seems off and was little flushed.  Her mother was an Therapist, sports and took her blood pressure and came up with a reading of 160/120.  At that time patient reported headache, dizziness, vomiting, as well as a nosebleed.  She did sit down and try to rest and drink some water and her blood pressure eventually came down to 140/99.  Patient does not take any medications to help her blood pressure.  She reports that the her blood pressure has just been intermittent.  She did report a nosebleed at work today as well as yesterday.  Patient does not have a history of hypertension but did have preeclampsia with her pregnancy 9 years ago.  There is a history of hypertension her family.  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  Patient has had some migraine headaches associated with this as well as some neuropathy.  Patient reports that it is hard for her to discern symptoms from high blood pressure like the dizziness and headache apart from her lipoma symptoms comes or similar.  She has been reported to have nosebleeds as her indicators of high blood pressure.     Past Medical History:  Diagnosis Date  . Bipolar disorder ([place])   . Common migraine with intractable migraine 03/18/2017  . Elevated serum glutamic pyruvic transaminase (SGPT) level   . Gait abnormality 03/18/2017  . Gestational diabetes   . High-risk pregnancy  01/24/2014  . History of pre-eclampsia   . History of pre-eclampsia in prior pregnancy, currently pregnant in third trimester 01/24/2014  . Polyhydramnios in third trimester 02/11/2014  . Preeclampsia   . Vitamin D deficiency disease     Patient Active Problem List   Diagnosis Date Noted  . Obesity (BMI 30.0-34.9) 03/26/2017  . Gait abnormality 03/18/2017  . Common migraine with intractable migraine 03/18/2017  . Insomnia 04/09/2015  . Tobacco abuse 03/22/2015  . Pain in right shoulder 02/27/2015  . Vitamin D deficiency disease   . Bipolar disorder ([place])   . Gestational diabetes mellitus in third trimester 01/24/2014  . Clavicle fracture 03/10/2012    Past Surgical History:  Procedure Laterality Date  . CLAVICLE SURGERY  2014  . TUBAL LIGATION      OB History   None      Home Medications    Prior to Admission medications   Medication Sig Start Date End Date Taking? Authorizing Provider  baclofen (LIORESAL) 10 MG tablet Take 0.5 tablets (5 mg total) by mouth 2 (two) times daily. 08/28/17   Kathrynn Ducking, MD  cloNIDine (CATAPRES) 0.1 MG tablet Take 1 tablet (0.1 mg total) by mouth every 12 (twelve) hours as needed (systolic blood pressure > 956 or diastolic BP > 213). 09/01/17   Luvenia Redden, PA-C  cyclobenzaprine (FLEXERIL) 5 MG tablet Take 1 tablet (5 mg total) by mouth 3 (three) times daily as  needed for muscle spasms. 07/19/15   Volney American, PA-C  FLUoxetine (PROZAC) 40 MG capsule Take 1 capsule (40 mg total) by mouth daily. 06/22/17   Park Liter P, DO  gabapentin (NEURONTIN) 300 MG capsule 1 capsule in the morning and 3 in the evening 08/28/17   Kathrynn Ducking, MD  Naltrexone-buPROPion HCl ER (CONTRAVE) 8-90 MG TB12 2 tabs in AM and 1 tab in the PM for 1 week, then 1 tab BID for 1 week, then 1 tab in AM for 1 week, then stop 07/10/17   Wynetta Emery, Megan P, DO  OLANZapine (ZYPREXA) 15 MG tablet TAKE 1 TABLET BY MOUTH EVERYDAY AT BEDTIME 08/29/17   Volney American, PA-C  rizatriptan (MAXALT) 10 MG tablet Take 1 tablet (10 mg total) by mouth 3 (three) times daily as needed for migraine. 08/28/17   Kathrynn Ducking, MD  traZODone (DESYREL) 100 MG tablet TAKE 1 TABLET BY MOUTH AT  BEDTIME 07/14/17   Volney American, PA-C    Family History Family History  Problem Relation Age of Onset  . Arthritis Mother   . Heart disease Father   . Hyperlipidemia Father   . Hypertension Father   . Asthma Brother     Social History Social History   Tobacco Use  . Smoking status: Current Every Day Smoker    Packs/day: 1.00  . Smokeless tobacco: Never Used  Substance Use Topics  . Alcohol use: Yes    Comment: Occasionally  . Drug use: No     Allergies   Patient has no known allergies.   Review of Systems Review of Systems as noted above HPI.  Other systems reviewed and found to be negative   Physical Exam Triage Vital Signs ED Triage Vitals  Enc Vitals Group     BP 09/01/17 1654 119/66     Pulse Rate 09/01/17 1654 74     Resp 09/01/17 1654 16     Temp 09/01/17 1654 98.2 F (36.8 C)     Temp Source 09/01/17 1654 Oral     SpO2 09/01/17 1654 98 %     Weight 09/01/17 1651 208 lb (94.3 kg)     Height 09/01/17 1651 5\' 7"  (1.702 m)     Head Circumference --      Peak Flow --      Pain Score 09/01/17 1651 8     Pain Loc --      Pain Edu? --      Excl. in [place]? --    No data found.  Updated Vital Signs BP 119/66 (BP Location: Left Arm)   Pulse 74   Temp 98.2 F (36.8 C) (Oral)   Resp 16   Ht 5\' 7"  (1.702 m)   Wt 208 lb (94.3 kg)   LMP 08/11/2017 (Approximate)   SpO2 98%   BMI 32.58 kg/m    Physical Exam  Constitutional: She is oriented to person, place, and time. She appears well-developed and well-nourished. She does not appear ill.  HENT:  Head: Normocephalic and atraumatic.  Eyes: Pupils are equal, round, and reactive to light.  Slight disconjugate gaze and some nystagmus when testing EOM.  Neck: Normal  range of motion.  Cardiovascular: Normal rate, regular rhythm and normal heart sounds.  No murmur heard. Pulmonary/Chest: Effort normal and breath sounds normal.  Musculoskeletal: Normal range of motion. She exhibits no edema.  Neurological: She is alert and oriented to person, place, and time.     UC  Treatments / Results  Labs (all labs ordered are listed, but only abnormal results are displayed) Labs Reviewed - No data to display  EKG None  Radiology No results found.  Procedures Procedures (including critical care time)  Medications Ordered in UC Medications - No data to display  Initial Impression / Assessment and Plan / UC Course  I have reviewed the triage vital signs and the nursing notes.  Pertinent labs & imaging results that were available during my care of the patient were reviewed by me and considered in my medical decision making (see chart for details).     Patient reporting intermittent hypertension, up to 160/120 on Friday that was symptomatic with nosebleed, dizziness, vomiting.  Looking through epic, patient has had several clinic visits since March in regards to her lipoma and has not had any hypertension that was noted during those visits.  Most of those show systolics 1 teens to 944H over 53s and 76s.  Again patient having trouble discerning symptoms of the nosebleed due to her current lipoma that is involving her brainstem and thus having neurological effects.  Patient was unable to get into see her PCP today as when she presented urgent care.  Patient reports her disconjugate gaze is not new and seems to gotten worse.  This may be also related to her lipoma as well.  We will give the patient a prescription for clonidine 0.1 mg p.o. to take every 12 hours as needed for elevated blood pressure.  Also advised her on the use of Afrin for her nosebleed.  Also give her instructions regarding being careful for the amount of time she uses her blood pressure medicine  and should she need it more than couple times a day that she needs to be reevaluated by her primary care doctor. Final Clinical Impressions(s) / UC Diagnoses   Final diagnoses:  Hypertension, unspecified type     Discharge Instructions     -Clonidine: 1 tablet as needed every 12 hours for elevated blood pressure.  Will take 1 tablet procedure systolic (top number) is greater than 675 or the diastolic (bottom number) is greater than 100. -She did not issue or taking this medication twice a day for more than a couple days in a row, need to follow-up with your primary care provider. -Also take concern as successive blood pressure readings can also elevate your blood pressure. -Afrin over-the-counter medication is good for helping stop your nosebleeds if they do not improve with pressure. Follow-up with your primary care provider for further care and management.    ED Prescriptions    Medication Sig Dispense Auth. Provider   cloNIDine (CATAPRES) 0.1 MG tablet Take 1 tablet (0.1 mg total) by mouth every 12 (twelve) hours as needed (systolic blood pressure > 916 or diastolic BP > 384). 90 tablet Luvenia Redden, PA-C     Controlled Substance Prescriptions [place] Controlled Substance Registry consulted? Not Applicable   Luvenia Redden, PA-C 09/01/17 1816

## 2017-09-01 NOTE — Telephone Encounter (Signed)
Friday- 160/120, P 127  Nosebleed, dizziness, vomiting, headache Patient hydrated and rested. Patient BP came down to 140/99- headache eased.  Saturday- patient had headache. Sunday patient had nose bleed, dizziness and headache.  Patient is requesting an appointment to check BP- patient is having a headache today. Patient is also having dizziness. Patient does have tumor that she is being watched: lipoma in head- base of spinal cord   With BP being as high as it was this week end and not knowing what it is now- patient advised she needs to be seen today- PCP not available. - advised UC today- patient will try to go- she will call back with follow up.  Reason for Disposition . [1] SEVERE headache (e.g., excruciating) AND [2] not improved after 2 hours of pain medicine  Answer Assessment - Initial Assessment Questions 1. LOCATION: "Where does it hurt?"      Back- all over 2. ONSET: "When did the headache start?" (Minutes, hours or days)      This morning- patient woke up with it 3. PATTERN: "Does the pain come and go, or has it been constant since it started?"     Comes and goes daily- constant since this morning 4. SEVERITY: "How bad is the pain?" and "What does it keep you from doing?"  (e.g., Scale 1-10; mild, moderate, or severe)   - MILD (1-3): doesn't interfere with normal activities    - MODERATE (4-7): interferes with normal activities or awakens from sleep    - SEVERE (8-10): excruciating pain, unable to do any normal activities        8- patient is at work 5. RECURRENT SYMPTOM: "Have you ever had headaches before?" If so, ask: "When was the last time?" and "What happened that time?"      Yes- almost daily- Maxalt dose seem to help 6. CAUSE: "What do you think is causing the headache?"     ? BP vs tumor vs migraine 7. MIGRAINE: "Have you been diagnosed with migraine headaches?" If so, ask: "Is this headache similar?"      Yes- same 8. HEAD INJURY: "Has there been any recent  injury to the head?"      no 9. OTHER SYMPTOMS: "Do you have any other symptoms?" (fever, stiff neck, eye pain, sore throat, cold symptoms)     Blurred vision 10. PREGNANCY: "Is there any chance you are pregnant?" "When was your last menstrual period?"       No- LMP- 7/29  Protocols used: HEADACHE-A-AH

## 2017-09-01 NOTE — ED Triage Notes (Signed)
Patient reports having elevated BP reading off and on since Friday.  Patient c/o pain at the back of her head.  Patient reports off and on nose bleeds for the past couple of days.

## 2017-09-01 NOTE — Discharge Instructions (Signed)
-  Clonidine: 1 tablet as needed every 12 hours for elevated blood pressure.  Will take 1 tablet procedure systolic (top number) is greater than 355 or the diastolic (bottom number) is greater than 100. -She did not issue or taking this medication twice a day for more than a couple days in a row, need to follow-up with your primary care provider. -Also take concern as successive blood pressure readings can also elevate your blood pressure. -Afrin over-the-counter medication is good for helping stop your nosebleeds if they do not improve with pressure. Follow-up with your primary care provider for further care and management.

## 2017-09-03 ENCOUNTER — Ambulatory Visit: Admission: RE | Admit: 2017-09-03 | Payer: Managed Care, Other (non HMO) | Source: Ambulatory Visit

## 2017-09-04 ENCOUNTER — Ambulatory Visit (INDEPENDENT_AMBULATORY_CARE_PROVIDER_SITE_OTHER): Payer: Managed Care, Other (non HMO) | Admitting: Family Medicine

## 2017-09-04 ENCOUNTER — Encounter: Payer: Self-pay | Admitting: Family Medicine

## 2017-09-04 VITALS — BP 120/76 | HR 72 | Temp 98.0°F | Ht 67.0 in | Wt 209.4 lb

## 2017-09-04 DIAGNOSIS — R030 Elevated blood-pressure reading, without diagnosis of hypertension: Secondary | ICD-10-CM

## 2017-09-04 DIAGNOSIS — R42 Dizziness and giddiness: Secondary | ICD-10-CM | POA: Diagnosis not present

## 2017-09-04 NOTE — Patient Instructions (Signed)
140/90 and below is considered normal - we would want you to be fairly consistently in the 120s/70s-80 range

## 2017-09-04 NOTE — Progress Notes (Signed)
BP 120/76 (BP Location: Left Arm, Patient Position: Sitting, Cuff Size: Normal)   Pulse 72   Temp 98 F (36.7 C) (Oral)   Ht 5\' 7"  (1.702 m)   Wt 209 lb 6.4 oz (95 kg)   LMP 08/11/2017 (Approximate)   SpO2 (!) 7%   BMI 32.80 kg/m    Subjective:    Patient ID: Carrie Burke, female    DOB: 05-Jan-1991, 27 y.o.   MRN: [id number]  HPI: Carrie Burke is a 27 y.o. female  Chief Complaint  Patient presents with  . Follow-up    Follow-up from Urgent Care Visit 09/01/2017. Patient said her BP at home has been running in the 140's/80's. Had an episode yesterday where patient became light headed, dizzy, and vomited.   . Hypertension  . Dizziness  . Emesis   Here today for UC f/u. Has been having dizziness, lightheadedness, BP spikes intermittently. Given clonidine at [facility] and notes good improvement with that. Takes the clonidine daily at bedtime and then prn. Hx of pre-eclampsia but no other known hx of BP issues. Does have a complicated Neurologic hx with a brain lipoma being followed by Neurology which has caused dizziness, gait abnormalities, migraines, N/V in the past. Has had some recent medication changes for that. Denies syncope, fall, fevers, chills, visual disturbances, confusion, urinary retention.   Past Medical History:  Diagnosis Date  . Bipolar disorder ([place])   . Common migraine with intractable migraine 03/18/2017  . Elevated serum glutamic pyruvic transaminase (SGPT) level   . Gait abnormality 03/18/2017  . Gestational diabetes   . High-risk pregnancy 01/24/2014  . History of pre-eclampsia   . History of pre-eclampsia in prior pregnancy, currently pregnant in third trimester 01/24/2014  . Polyhydramnios in third trimester 02/11/2014  . Preeclampsia   . Vitamin D deficiency disease    Social History   Socioeconomic History  . Marital status: Married    Spouse name: Not on file  . Number of children: Not on file  . Years of education: Not on file  .  Highest education level: Not on file  Occupational History  . Not on file  Social Needs  . Financial resource strain: Not on file  . Food insecurity:    Worry: Not on file    Inability: Not on file  . Transportation needs:    Medical: Not on file    Non-medical: Not on file  Tobacco Use  . Smoking status: Current Every Day Smoker    Packs/day: 1.00  . Smokeless tobacco: Never Used  Substance and Sexual Activity  . Alcohol use: Yes    Comment: Occasionally  . Drug use: No  . Sexual activity: Not on file  Lifestyle  . Physical activity:    Days per week: Not on file    Minutes per session: Not on file  . Stress: Not on file  Relationships  . Social connections:    Talks on phone: Not on file    Gets together: Not on file    Attends religious service: Not on file    Active member of club or organization: Not on file    Attends meetings of clubs or organizations: Not on file    Relationship status: Not on file  . Intimate partner violence:    Fear of current or ex partner: Not on file    Emotionally abused: Not on file    Physically abused: Not on file    Forced sexual activity: Not  on file  Other Topics Concern  . Not on file  Social History Narrative   Lives with husband and kids   Caffeine use: coffee daily   Right handed     Relevant past medical, surgical, family and social history reviewed and updated as indicated. Interim medical history since our last visit reviewed. Allergies and medications reviewed and updated.  Review of Systems  Per HPI unless specifically indicated above     Objective:    BP 120/76 (BP Location: Left Arm, Patient Position: Sitting, Cuff Size: Normal)   Pulse 72   Temp 98 F (36.7 C) (Oral)   Ht 5\' 7"  (1.702 m)   Wt 209 lb 6.4 oz (95 kg)   LMP 08/11/2017 (Approximate)   SpO2 (!) 7%   BMI 32.80 kg/m   Wt Readings from Last 3 Encounters:  09/04/17 209 lb 6.4 oz (95 kg)  09/01/17 208 lb (94.3 kg)  08/28/17 208 lb 8 oz (94.6  kg)    Physical Exam  Constitutional: She is oriented to person, place, and time. She appears well-developed and well-nourished. No distress.  HENT:  Head: Atraumatic.  Eyes: Pupils are equal, round, and reactive to light. Conjunctivae and EOM are normal.  Neck: Normal range of motion. Neck supple.  Cardiovascular: Normal rate and regular rhythm.  Pulmonary/Chest: Effort normal and breath sounds normal.  Musculoskeletal: Normal range of motion.  Neurological: She is alert and oriented to person, place, and time. No cranial nerve deficit. Coordination normal.  Skin: Skin is warm and dry.  Psychiatric: She has a normal mood and affect. Her behavior is normal.  Nursing note and vitals reviewed.   Results for orders placed or performed in visit on 09/04/17  TSH  Result Value Ref Range   TSH 2.570 0.450 - 4.500 uIU/mL      Assessment & Plan:   Problem List Items Addressed This Visit      Cardiovascular and Mediastinum   Elevated blood pressure, situational - Primary    BP spikes causing significant sxs, which seem to be relieved with clonidine. Will do 2 day trial off medication to see if BPs are consistently elevated. Restart daily and prn after 2 days if BPs high/symptomatic. F/u in 2 weeks to monitor. Will check TSH today, unclear why BPs all of a sudden have become uncontrolled.        Other Visit Diagnoses    Dizziness       F/u with Neurologist if persisting or worsening   Relevant Orders   EKG 12-Lead (Completed)   TSH (Completed)       Follow up plan: Return in about 2 weeks (around 09/18/2017) for BP.

## 2017-09-05 ENCOUNTER — Telehealth: Payer: Self-pay | Admitting: Family Medicine

## 2017-09-05 LAB — TSH: TSH: 2.57 u[IU]/mL (ref 0.450–4.500)

## 2017-09-05 MED ORDER — AMLODIPINE BESYLATE 5 MG PO TABS: 5.0000 mg | ORAL_TABLET | Freq: Every day | ORAL | 0 refills | Status: DC

## 2017-09-05 NOTE — Telephone Encounter (Signed)
Copied from [place] [id number]. Topic: Quick Communication - See Telephone Encounter >> Sep 05, 2017  9:23 AM Blase Mess A wrote: CRM for notification. See Telephone encounter for: 09/05/17.

## 2017-09-05 NOTE — Telephone Encounter (Signed)
Start taking amlodipine daily ( I just sent that one over), reserve clonidine for as needed use. She should go to the ER if sxs do not resolve

## 2017-09-05 NOTE — Telephone Encounter (Signed)
Patient notified of what [name] advised and patient understood.

## 2017-09-05 NOTE — Telephone Encounter (Signed)
[  09/05/2017 10:35 AM]  Carrie Burke:   Patient called this morning wanting to let Dr. Orene Desanctis know that her blood pressure was elevated BP was 145/100.  Dr. Orene Desanctis advised her not to take BP meds for 2-3 days. However, felt that she needed to take the medication because of the elevate Blood Pressure.  She also had other symptoms such as hot, dizzy, throwing up, and clammy  elevated bp was 145/100 with medication it was 126/70.  She wanted some advice.

## 2017-09-07 DIAGNOSIS — I10 Essential (primary) hypertension: Secondary | ICD-10-CM | POA: Insufficient documentation

## 2017-09-07 NOTE — Assessment & Plan Note (Signed)
BP spikes causing significant sxs, which seem to be relieved with clonidine. Will do 2 day trial off medication to see if BPs are consistently elevated. Restart daily and prn after 2 days if BPs high/symptomatic. F/u in 2 weeks to monitor. Will check TSH today, unclear why BPs all of a sudden have become uncontrolled.

## 2017-09-17 ENCOUNTER — Encounter: Payer: Self-pay | Admitting: Family Medicine

## 2017-09-17 ENCOUNTER — Ambulatory Visit (INDEPENDENT_AMBULATORY_CARE_PROVIDER_SITE_OTHER): Payer: Managed Care, Other (non HMO) | Admitting: Family Medicine

## 2017-09-17 VITALS — BP 126/76 | HR 99 | Temp 97.7°F | Ht 67.0 in | Wt 213.9 lb

## 2017-09-17 DIAGNOSIS — R112 Nausea with vomiting, unspecified: Secondary | ICD-10-CM | POA: Diagnosis not present

## 2017-09-17 DIAGNOSIS — E669 Obesity, unspecified: Secondary | ICD-10-CM

## 2017-09-17 DIAGNOSIS — R635 Abnormal weight gain: Secondary | ICD-10-CM

## 2017-09-17 DIAGNOSIS — R030 Elevated blood-pressure reading, without diagnosis of hypertension: Secondary | ICD-10-CM

## 2017-09-17 LAB — PREGNANCY, URINE: Preg Test, Ur: NEGATIVE

## 2017-09-17 MED ORDER — AMLODIPINE BESYLATE 10 MG PO TABS: 10.0000 mg | ORAL_TABLET | Freq: Every day | ORAL | 1 refills | Status: DC

## 2017-09-17 NOTE — Progress Notes (Signed)
BP 126/76 (BP Location: Right Arm, Patient Position: Sitting, Cuff Size: Large)   Pulse 99   Temp 97.7 F (36.5 C) (Oral)   Ht 5\' 7"  (1.702 m)   Wt 213 lb 14.4 oz (97 kg)   SpO2 96%   BMI 33.50 kg/m    Subjective:    Patient ID: Carrie Burke, female    DOB: [birth year], 27 y.o.   MRN: [id number]  HPI: Carrie Burke is a 27 y.o. female  Chief Complaint  Patient presents with  . Hypertension    2 week follow-up. Patient has been keeping a log of her blood pressure.   . Vomiting    Patient has been vomiting, would like urine for pregnancy. Patient is gaining weight.    Here today for 2 week BP f/u after starting amlodipine. Taking amlodipine daily, clonidine sometimes at night. 120s-140s/70s-80s. N/V, occasional dizziness. Denies CP, SOB, syncope.   Very concerned about her weight gain. Does not feel like eating habits are changing but she continues to gain weight. Psychiatric medications have not changed recently either. Husband thinks she may be pregnant and would like her to be checked. Currently on period since yesterday, really light. Last period 5 weeks ago. Not on any birth control and does have unprotected sex. Stopped contrave back in July, and Neurology took her off topamax for her migraines in June.   Past Medical History:  Diagnosis Date  . Bipolar disorder ([place])   . Common migraine with intractable migraine 03/18/2017  . Elevated serum glutamic pyruvic transaminase (SGPT) level   . Gait abnormality 03/18/2017  . Gestational diabetes   . High-risk pregnancy 01/24/2014  . History of pre-eclampsia   . History of pre-eclampsia in prior pregnancy, currently pregnant in third trimester 01/24/2014  . Polyhydramnios in third trimester 02/11/2014  . Preeclampsia   . Vitamin D deficiency disease    Social History   Socioeconomic History  . Marital status: Married    Spouse name: Not on file  . Number of children: Not on file  . Years of education: Not on  file  . Highest education level: Not on file  Occupational History  . Not on file  Social Needs  . Financial resource strain: Not on file  . Food insecurity:    Worry: Not on file    Inability: Not on file  . Transportation needs:    Medical: Not on file    Non-medical: Not on file  Tobacco Use  . Smoking status: Current Every Day Smoker    Packs/day: 1.00  . Smokeless tobacco: Never Used  Substance and Sexual Activity  . Alcohol use: Yes    Comment: Occasionally  . Drug use: No  . Sexual activity: Not on file  Lifestyle  . Physical activity:    Days per week: Not on file    Minutes per session: Not on file  . Stress: Not on file  Relationships  . Social connections:    Talks on phone: Not on file    Gets together: Not on file    Attends religious service: Not on file    Active member of club or organization: Not on file    Attends meetings of clubs or organizations: Not on file    Relationship status: Not on file  . Intimate partner violence:    Fear of current or ex partner: Not on file    Emotionally abused: Not on file    Physically abused: Not on file  Forced sexual activity: Not on file  Other Topics Concern  . Not on file  Social History Narrative   Lives with husband and kids   Caffeine use: coffee daily   Right handed     Relevant past medical, surgical, family and social history reviewed and updated as indicated. Interim medical history since our last visit reviewed. Allergies and medications reviewed and updated.  Review of Systems  Per HPI unless specifically indicated above     Objective:    BP 126/76 (BP Location: Right Arm, Patient Position: Sitting, Cuff Size: Large)   Pulse 99   Temp 97.7 F (36.5 C) (Oral)   Ht 5\' 7"  (1.702 m)   Wt 213 lb 14.4 oz (97 kg)   SpO2 96%   BMI 33.50 kg/m   Wt Readings from Last 3 Encounters:  09/17/17 213 lb 14.4 oz (97 kg)  09/04/17 209 lb 6.4 oz (95 kg)  09/01/17 208 lb (94.3 kg)    Physical Exam    Constitutional: She is oriented to person, place, and time. She appears well-developed and well-nourished.  HENT:  Head: Atraumatic.  Eyes: Conjunctivae and EOM are normal.  Neck: Normal range of motion. Neck supple.  Cardiovascular: Normal rate and regular rhythm.  Pulmonary/Chest: Effort normal and breath sounds normal.  Musculoskeletal: Normal range of motion.  Neurological: She is alert and oriented to person, place, and time.  Skin: Skin is warm and dry.  Psychiatric: She has a normal mood and affect. Her behavior is normal.  Nursing note and vitals reviewed.   Results for orders placed or performed in visit on 09/17/17  Pregnancy, urine  Result Value Ref Range   Preg Test, Ur Negative Negative      Assessment & Plan:   Problem List Items Addressed This Visit      Cardiovascular and Mediastinum   Elevated blood pressure, situational - Primary    Improved control on amlodipine with prn clonidine. Increase amlodipine to 10 mg for more consistency in the low normal range. Continue current regimen. Work on weight loss, DASH diet      Relevant Medications   amLODipine (NORVASC) 10 MG tablet     Other   Obesity (BMI 30.0-34.9)    Likely from coming off two medications that help with appetite suppression in the last few months. Work on increasing activity levels and decreasing carbs. Pregnancy test neg today.        Other Visit Diagnoses    Weight gain       Relevant Orders   Pregnancy, urine (Completed)   Non-intractable vomiting with nausea, unspecified vomiting type       Unclear etiology. Urine preg neg, BPs under good control, labs have been stable. Monitor closely for improvement       Follow up plan: Return in about 4 weeks (around 10/15/2017) for BP.

## 2017-09-20 NOTE — Assessment & Plan Note (Addendum)
Improved control on amlodipine with prn clonidine. Increase amlodipine to 10 mg for more consistency in the low normal range. Continue current regimen. Work on weight loss, DASH diet

## 2017-09-20 NOTE — Assessment & Plan Note (Signed)
Likely from coming off two medications that help with appetite suppression in the last few months. Work on increasing activity levels and decreasing carbs. Pregnancy test neg today.

## 2017-09-20 NOTE — Patient Instructions (Signed)
Follow up in 1 month   

## 2017-09-26 ENCOUNTER — Other Ambulatory Visit: Payer: Self-pay | Admitting: Family Medicine

## 2017-09-26 NOTE — Telephone Encounter (Signed)
olanzapine refill Last Refill:08/29/17 # 30 No RF Last OV: 02/06/17 PCP: [name] PA Pharmacy:[facility]

## 2017-09-29 ENCOUNTER — Other Ambulatory Visit: Payer: Self-pay | Admitting: Family Medicine

## 2017-10-17 ENCOUNTER — Other Ambulatory Visit: Payer: Self-pay

## 2017-10-17 ENCOUNTER — Ambulatory Visit (INDEPENDENT_AMBULATORY_CARE_PROVIDER_SITE_OTHER): Payer: Managed Care, Other (non HMO) | Admitting: Family Medicine

## 2017-10-17 ENCOUNTER — Encounter: Payer: Self-pay | Admitting: Family Medicine

## 2017-10-17 VITALS — BP 134/73 | HR 90 | Temp 99.0°F | Ht 67.0 in | Wt 218.0 lb

## 2017-10-17 DIAGNOSIS — Z7689 Persons encountering health services in other specified circumstances: Secondary | ICD-10-CM | POA: Diagnosis not present

## 2017-10-17 DIAGNOSIS — Z23 Encounter for immunization: Secondary | ICD-10-CM | POA: Diagnosis not present

## 2017-10-17 DIAGNOSIS — E66811 Obesity, class 1: Secondary | ICD-10-CM

## 2017-10-17 DIAGNOSIS — E669 Obesity, unspecified: Secondary | ICD-10-CM

## 2017-10-17 DIAGNOSIS — I10 Essential (primary) hypertension: Secondary | ICD-10-CM | POA: Diagnosis not present

## 2017-10-17 MED ORDER — CLONIDINE HCL 0.1 MG PO TABS: 0.1000 mg | ORAL_TABLET | Freq: Two times a day (BID) | ORAL | 1 refills | Status: DC | PRN

## 2017-10-17 MED ORDER — FLUTICASONE PROPIONATE 50 MCG/ACT NA SUSP: 2.0000 | Freq: Two times a day (BID) | NASAL | 6 refills | Status: DC

## 2017-10-17 MED ORDER — PREDNISONE 10 MG PO TABS: ORAL_TABLET | ORAL | 0 refills | Status: DC

## 2017-10-17 MED ORDER — LORCASERIN HCL 10 MG PO TABS: 1.0000 | ORAL_TABLET | Freq: Two times a day (BID) | ORAL | 3 refills | Status: DC

## 2017-10-17 NOTE — Progress Notes (Signed)
BP 134/73   Pulse 90   Temp 99 F (37.2 C) (Oral)   Ht 5\' 7"  (1.702 m)   Wt 218 lb (98.9 kg)   SpO2 98%   BMI 34.14 kg/m    Subjective:    Patient ID: Carrie Burke, female    DOB: 01/30/1990, 27 y.o.   MRN: [id number]  HPI: Kareen Jefferys is a 27 y.o. female  Chief Complaint  Patient presents with  . Hypertension    4w f/u   Here today for BP f/u. Taking amlodipine and clonidine daily, having occasional spikes in BP that are coming with nausea and dizziness but for the most part they've been under good control. Denies CP, SOB, syncope. No side effects with her medicines.   Does have some concerns about her sleep with the weight that she's put on. Snoring, not getting good rest. Does have a hx of insomnia for which she takes trazodone which used to help. Not trying anything else currently.   Still very concerned about her weight gain. Feels she's working hard on lifestyle modifications but the number continues to go up. Did not tolerate contrave in the past.   Past Medical History:  Diagnosis Date  . Bipolar disorder ([place])   . Common migraine with intractable migraine 03/18/2017  . Elevated serum glutamic pyruvic transaminase (SGPT) level   . Gait abnormality 03/18/2017  . Gestational diabetes   . High-risk pregnancy 01/24/2014  . History of pre-eclampsia   . History of pre-eclampsia in prior pregnancy, currently pregnant in third trimester 01/24/2014  . Polyhydramnios in third trimester 02/11/2014  . Preeclampsia   . Vitamin D deficiency disease    Social History   Socioeconomic History  . Marital status: Married    Spouse name: Not on file  . Number of children: Not on file  . Years of education: Not on file  . Highest education level: Not on file  Occupational History  . Not on file  Social Needs  . Financial resource strain: Not on file  . Food insecurity:    Worry: Not on file    Inability: Not on file  . Transportation needs:    Medical: Not  on file    Non-medical: Not on file  Tobacco Use  . Smoking status: Current Every Day Smoker    Packs/day: 1.00  . Smokeless tobacco: Never Used  Substance and Sexual Activity  . Alcohol use: Yes    Comment: Occasionally  . Drug use: No  . Sexual activity: Not on file  Lifestyle  . Physical activity:    Days per week: Not on file    Minutes per session: Not on file  . Stress: Not on file  Relationships  . Social connections:    Talks on phone: Not on file    Gets together: Not on file    Attends religious service: Not on file    Active member of club or organization: Not on file    Attends meetings of clubs or organizations: Not on file    Relationship status: Not on file  . Intimate partner violence:    Fear of current or ex partner: Not on file    Emotionally abused: Not on file    Physically abused: Not on file    Forced sexual activity: Not on file  Other Topics Concern  . Not on file  Social History Narrative   Lives with husband and kids   Caffeine use: coffee daily  Right handed     Relevant past medical, surgical, family and social history reviewed and updated as indicated. Interim medical history since our last visit reviewed. Allergies and medications reviewed and updated.  Review of Systems  Per HPI unless specifically indicated above     Objective:    BP 134/73   Pulse 90   Temp 99 F (37.2 C) (Oral)   Ht 5\' 7"  (1.702 m)   Wt 218 lb (98.9 kg)   SpO2 98%   BMI 34.14 kg/m   Wt Readings from Last 3 Encounters:  10/17/17 218 lb (98.9 kg)  09/17/17 213 lb 14.4 oz (97 kg)  09/04/17 209 lb 6.4 oz (95 kg)    Physical Exam  Constitutional: She is oriented to person, place, and time. She appears well-developed and well-nourished.  HENT:  Head: Atraumatic.  Mouth/Throat: Oropharynx is clear and moist.  Eyes: Conjunctivae and EOM are normal.  Neck: Normal range of motion. Neck supple.  Cardiovascular: Normal rate, regular rhythm and normal heart  sounds.  Pulmonary/Chest: Effort normal and breath sounds normal.  Musculoskeletal: Normal range of motion.  Neurological: She is alert and oriented to person, place, and time.  Skin: Skin is warm and dry.  Psychiatric: She has a normal mood and affect. Her behavior is normal.  Nursing note and vitals reviewed.   Results for orders placed or performed in visit on 09/17/17  Pregnancy, urine  Result Value Ref Range   Preg Test, Ur Negative Negative      Assessment & Plan:   Problem List Items Addressed This Visit      Cardiovascular and Mediastinum   Essential hypertension    Under fairly good control with current regimen, continue medication and work hard on weight loss, lifestyle modifications      Relevant Medications   cloNIDine (CATAPRES) 0.1 MG tablet     Other   Obesity (BMI 30.0-34.9)    Trial belviq, did not tolerate contrave in the past. Work on diet and exercise improvements in meantime       Other Visit Diagnoses    Sleep concern    -  Primary   Will refer for sleep study. Work on weight loss. Continue trazodone   Relevant Orders   Ambulatory referral to Sleep Studies   Flu vaccine need           Follow up plan: Return in about 6 weeks (around 11/28/2017) for Weight .

## 2017-10-22 ENCOUNTER — Ambulatory Visit: Payer: Managed Care, Other (non HMO) | Admitting: Neurology

## 2017-10-22 ENCOUNTER — Encounter

## 2017-10-24 NOTE — Patient Instructions (Signed)
Follow-up in 6 weeks

## 2017-10-24 NOTE — Assessment & Plan Note (Signed)
Trial belviq, did not tolerate contrave in the past. Work on diet and exercise improvements in meantime

## 2017-10-24 NOTE — Assessment & Plan Note (Signed)
Under fairly good control with current regimen, continue medication and work hard on weight loss, lifestyle modifications

## 2017-11-18 ENCOUNTER — Other Ambulatory Visit: Payer: Self-pay | Admitting: Family Medicine

## 2017-11-18 NOTE — Telephone Encounter (Signed)
Requested medication (s) are due for refill today: yes  Requested medication (s) are on the active medication list: yes  Last refill:  07/14/17  Future visit scheduled: yes  Notes to clinic:  Unable to fill sleep medication   Requested Prescriptions  Pending Prescriptions Disp Refills   traZODone (DESYREL) 50 MG tablet [Pharmacy Med Name: TRAZODONE 50MG  TABLETS] 30 tablet 0    Sig: TAKE ONE-HALF TO ONE AT BEDTIME AS NEEDED FOR SLEEP     Psychiatry: Antidepressants - Serotonin Modulator Passed - 11/18/2017  9:52 AM      Passed - Valid encounter within last 6 months    Recent Outpatient Visits          1 month ago Sleep concern   [facility], PA-C   2 months ago Elevated blood pressure, situational   [place], [place], [place]   2 months ago Elevated blood pressure, situational   [place], [place], [place]   4 months ago Obesity (BMI 30.0-34.9)   [facility], Megan P, DO   7 months ago Obesity (BMI 30.0-34.9)   [facility], [place]      Future Appointments            In 1 week Orene Desanctis, Lilia Argue, [place], [place]

## 2017-11-28 ENCOUNTER — Encounter: Payer: Self-pay | Admitting: Unknown Physician Specialty

## 2017-11-28 ENCOUNTER — Ambulatory Visit: Payer: Managed Care, Other (non HMO) | Admitting: Family Medicine

## 2017-11-28 ENCOUNTER — Ambulatory Visit: Payer: Managed Care, Other (non HMO) | Admitting: Unknown Physician Specialty

## 2017-11-28 DIAGNOSIS — G43019 Migraine without aura, intractable, without status migrainosus: Secondary | ICD-10-CM | POA: Diagnosis not present

## 2017-11-28 DIAGNOSIS — G4700 Insomnia, unspecified: Secondary | ICD-10-CM | POA: Diagnosis not present

## 2017-11-28 DIAGNOSIS — E669 Obesity, unspecified: Secondary | ICD-10-CM

## 2017-11-28 DIAGNOSIS — F3131 Bipolar disorder, current episode depressed, mild: Secondary | ICD-10-CM

## 2017-11-28 MED ORDER — TRAZODONE HCL 100 MG PO TABS: 100.0000 mg | ORAL_TABLET | Freq: Every day | ORAL | 1 refills | Status: DC

## 2017-11-28 MED ORDER — LORCASERIN HCL 10 MG PO TABS: 1.0000 | ORAL_TABLET | Freq: Two times a day (BID) | ORAL | 1 refills | Status: DC

## 2017-11-28 MED ORDER — OLANZAPINE 15 MG PO TABS: 15.0000 mg | ORAL_TABLET | Freq: Every day | ORAL | 1 refills | Status: DC

## 2017-11-28 MED ORDER — BACLOFEN 10 MG PO TABS: 5.0000 mg | ORAL_TABLET | Freq: Two times a day (BID) | ORAL | 1 refills | Status: DC

## 2017-11-28 NOTE — Progress Notes (Signed)
BP 121/77   Pulse 80   Temp 98.6 F (37 C) (Oral)   Ht 5\' 7"  (1.702 m)   Wt 214 lb 6.4 oz (97.3 kg)   SpO2 96%   BMI 33.58 kg/m    Subjective:    Patient ID: Carrie Burke, female    DOB: 1990/10/06, 27 y.o.   MRN: [id number]  HPI: Carrie Burke is a 27 y.o. female  Chief Complaint  Patient presents with  . Weight Check    6 week f/up  . Medication Refill    pt states she would like a refill on zyprexa and trazodone   Obesity Pt is taking Belviq.  She is feeling well and lost 4 pounds.  States she does want to continue the Belviq.    Bipolar  States she is doing well with present medications.   Depression screen [facility] 2/9 11/28/2017 09/25/2016 08/23/2016 04/07/2015 03/17/2015  Decreased Interest 0 2 2 2 2   Down, Depressed, Hopeless 1 3 3 2 2   PHQ - 2 Score 1 5 5 4 4   Altered sleeping 0 3 3 2  0  Tired, decreased energy 2 2 3 2 3   Change in appetite 0 2 2 0 2  Feeling bad or failure about yourself  1 2 3 3 3   Trouble concentrating 0 2 3 3 3   Moving slowly or fidgety/restless 0 3 2 0 2  Suicidal thoughts 0 0 0 0 0  PHQ-9 Score 4 19 21 14 17   Difficult doing work/chores - - - Somewhat difficult Somewhat difficult     Neck pain/headaches Would like a refill of Baclofen which she takes BID for neck pain.  States it helps with her headaches.    Relevant past medical, surgical, family and social history reviewed and updated as indicated. Interim medical history since our last visit reviewed. Allergies and medications reviewed and updated.  Review of Systems  Per HPI unless specifically indicated above     Objective:    BP 121/77   Pulse 80   Temp 98.6 F (37 C) (Oral)   Ht 5\' 7"  (1.702 m)   Wt 214 lb 6.4 oz (97.3 kg)   SpO2 96%   BMI 33.58 kg/m   Wt Readings from Last 3 Encounters:  11/28/17 214 lb 6.4 oz (97.3 kg)  10/17/17 218 lb (98.9 kg)  09/17/17 213 lb 14.4 oz (97 kg)    Physical Exam  Constitutional: She is oriented to person, place,  and time. She appears well-developed and well-nourished. No distress.  HENT:  Head: Normocephalic and atraumatic.  Eyes: Conjunctivae and lids are normal. Right eye exhibits no discharge. Left eye exhibits no discharge. No scleral icterus.  Neck: Normal range of motion. Neck supple. No JVD present. Carotid bruit is not present.  Cardiovascular: Normal rate, regular rhythm and normal heart sounds.  Pulmonary/Chest: Effort normal and breath sounds normal.  Abdominal: Normal appearance. There is no splenomegaly or hepatomegaly.  Musculoskeletal: Normal range of motion.  Neurological: She is alert and oriented to person, place, and time.  Skin: Skin is warm, dry and intact. No rash noted. No pallor.  Psychiatric: She has a normal mood and affect. Her behavior is normal. Judgment and thought content normal.    Results for orders placed or performed in visit on 09/17/17  Pregnancy, urine  Result Value Ref Range   Preg Test, Ur Negative Negative      Assessment & Plan:   Problem List Items Addressed This Visit  Unprioritized   Bipolar disorder ([place])    Stable, continue present medications.        Common migraine with intractable migraine    Refill Baclofen as helping with migraines      Relevant Medications   baclofen (LIORESAL) 10 MG tablet   traZODone (DESYREL) 100 MG tablet   Insomnia    Had a sleep study.  Results not available.  She will call feeling great as losing insurance this month      Obesity (BMI 30.0-34.9)    Some results with Belviq.  Refilled rx.  Recheck in 3 months          Follow up plan: Return in about 3 months (around 02/28/2018) for with [name] Lan=.+.+-.  Check CMP and Hgb A1C

## 2017-11-28 NOTE — Assessment & Plan Note (Signed)
Stable, continue present medications.   

## 2017-11-28 NOTE — Assessment & Plan Note (Signed)
Refill Baclofen as helping with migraines

## 2017-11-28 NOTE — Assessment & Plan Note (Signed)
Had a sleep study.  Results not available.  She will call feeling great as losing insurance this month

## 2017-11-28 NOTE — Assessment & Plan Note (Signed)
Some results with Belviq.  Refilled rx.  Recheck in 3 months

## 2017-12-01 ENCOUNTER — Encounter: Payer: Self-pay | Admitting: Adult Health

## 2017-12-01 ENCOUNTER — Ambulatory Visit: Payer: Managed Care, Other (non HMO) | Admitting: Adult Health

## 2017-12-06 ENCOUNTER — Other Ambulatory Visit: Payer: Self-pay | Admitting: Family Medicine

## 2017-12-08 NOTE — Telephone Encounter (Signed)
Patient called and asked about the refill request for [facility] from [facility]. She says that she receives refills from [facility] for that medication. I advised to call [facility] to have the medication removed from the automatic refill request, she verbalized understanding.

## 2017-12-13 ENCOUNTER — Other Ambulatory Visit: Payer: Self-pay | Admitting: Family Medicine

## 2017-12-30 ENCOUNTER — Telehealth: Payer: Self-pay

## 2017-12-30 NOTE — Telephone Encounter (Signed)
I contacted the patient about the request for a sooner appointment via the wait list. Patient advised we had an appointment open up for today (12/30/2017) at 12pm. Patient stated this appointment would not work for her due to her not having active insurance until the first of the year(01/14/2018). Patient's appointment will be kept as scheduled for 06/23/2017.

## 2018-01-23 ENCOUNTER — Telehealth: Payer: Self-pay | Admitting: Neurology

## 2018-01-23 ENCOUNTER — Ambulatory Visit: Payer: Self-pay

## 2018-01-23 NOTE — Telephone Encounter (Signed)
I called pt to discuss. No answer, no VM set up. Will try again later. 

## 2018-01-23 NOTE — Telephone Encounter (Signed)
Pt called with C/O headache at the base of her skull, dizziness and stiff neck.  She reports nose bleed as well.  She has a Hx of migraine. She is unsure if she has fever. She has taken Ibuprofen without relief. Pain is rated at 5 and constant.  She reports having a runny nose last week.  Per protocol Pt will be go to ED for evaluation of her symptoms. Care advice read to patient. Pt verbalized understanding of all instructions. Reason for Disposition . Stiff neck (can't touch chin to chest)  Answer Assessment - Initial Assessment Questions 1. LOCATION: "Where does it hurt?"      Base of skull going down neck 2. ONSET: "When did the headache start?" (Minutes, hours or days)      Woke up today like this 3. PATTERN: "Does the pain come and go, or has it been constant since it started?"     constant 4. SEVERITY: "How bad is the pain?" and "What does it keep you from doing?"  (e.g., Scale 1-10; mild, moderate, or severe)   - MILD (1-3): doesn't interfere with normal activities    - MODERATE (4-7): interferes with normal activities or awakens from sleep    - SEVERE (8-10): excruciating pain, unable to do any normal activities        5 5. RECURRENT SYMPTOM: "Have you ever had headaches before?" If so, ask: "When was the last time?" and "What happened that time?"      Yes couple weeks ago 6. CAUSE: "What do you think is causing the headache?"     no 7. MIGRAINE: "Have you been diagnosed with migraine headaches?" If so, ask: "Is this headache similar?"      Migraines  8. HEAD INJURY: "Has there been any recent injury to the head?"      no 9. OTHER SYMPTOMS: "Do you have any other symptoms?" (fever, stiff neck, eye pain, sore throat, cold symptoms)     Stiff neck.nose bleed runny nose dizzy 10. PREGNANCY: "Is there any chance you are pregnant?" "When was your last menstrual period?"       No  Last menses 2-3 weeks ago  Protocols used: HEADACHE-A-AH

## 2018-01-23 NOTE — Telephone Encounter (Signed)
Pt states she woke with pain in the base of her neck, a nose bleed and dizzyness and would like to know if it has to do with the medications that's she on. Please advise.

## 2018-01-23 NOTE — Telephone Encounter (Signed)
I called pt. She thinks she had a migraine this morning. She went home and took some maxalt and is resting and does feel better. Pt reports that she had neck pain, a nosebleed, and dizziness this morning. She denies starting any new medications. I advised her that I don't think her symptoms are related to medications that she has been on for months but that I would let Dr. Jannifer Franklin know of pt's concerns and if he had anything further to add, then he will call her back. Pt verbalized understanding.

## 2018-01-23 NOTE — Telephone Encounter (Signed)
I called the patient. She is feeling better now with rest and medications, she does not have frequent nosebleeds, no history of sinus disease. She will call us if the problem persists.

## 2018-03-02 ENCOUNTER — Ambulatory Visit (INDEPENDENT_AMBULATORY_CARE_PROVIDER_SITE_OTHER): Payer: BLUE CROSS/BLUE SHIELD | Admitting: Family Medicine

## 2018-03-02 ENCOUNTER — Encounter: Payer: Self-pay | Admitting: Family Medicine

## 2018-03-02 ENCOUNTER — Telehealth: Payer: Self-pay | Admitting: Family Medicine

## 2018-03-02 VITALS — BP 130/84 | HR 103 | Temp 98.3°F | Ht 67.0 in | Wt 218.3 lb

## 2018-03-02 DIAGNOSIS — I10 Essential (primary) hypertension: Secondary | ICD-10-CM | POA: Diagnosis not present

## 2018-03-02 DIAGNOSIS — E669 Obesity, unspecified: Secondary | ICD-10-CM | POA: Diagnosis not present

## 2018-03-02 DIAGNOSIS — F3131 Bipolar disorder, current episode depressed, mild: Secondary | ICD-10-CM | POA: Diagnosis not present

## 2018-03-02 MED ORDER — LURASIDONE HCL 20 MG PO TABS: 20.0000 mg | ORAL_TABLET | Freq: Every day | ORAL | 0 refills | Status: DC

## 2018-03-02 NOTE — Progress Notes (Signed)
BP 130/84 (BP Location: Left Arm, Patient Position: Sitting, Cuff Size: Large)   Pulse (!) 103   Temp 98.3 F (36.8 C)   Ht 5\' 7"  (1.702 m)   Wt 218 lb 5 oz (99 kg)   SpO2 99%   BMI 34.19 kg/m    Subjective:    Patient ID: Carrie Burke, female    DOB: [birth year], 28 y.o.   MRN: [id number]  HPI: Carrie Burke is a 28 y.o. female  Chief Complaint  Patient presents with  . Obesity    A1 C and CMP  . Hypertension   Here today for weight check. Has been on belviq for about 3-4 months now and tolerating well. Weights have been steady without any consistent loss but pt would like to keep trying the medicine. States she barely eats anything, typically just dinner. Does not exercise.   Also feeling much more irritable lately, thinking her medicines may need to be adjusted. Currently on zyprexa, prozac, trazodone. No manic episodes, panic attacks, significant depressive episodes. Was followed by Psychiatry in the past but has not gone in years. States she does not have schedule availability to go back. Denies SI/HI.   BPs have been mostly stable on current regimen, sometimes high but typically high normal range. Denies side effects, CP, SOB. Taking clonidine and amlodipine currently.   Depression screen [facility] 2/9 03/02/2018 11/28/2017 09/25/2016  Decreased Interest 1 0 2  Down, Depressed, Hopeless 3 1 3   PHQ - 2 Score 4 1 5   Altered sleeping 2 0 3  Tired, decreased energy 2 2 2   Change in appetite 1 0 2  Feeling bad or failure about yourself  0 1 2  Trouble concentrating 0 0 2  Moving slowly or fidgety/restless 1 0 3  Suicidal thoughts 0 0 0  PHQ-9 Score 10 4 19   Difficult doing work/chores - - -    Relevant past medical, surgical, family and social history reviewed and updated as indicated. Interim medical history since our last visit reviewed. Allergies and medications reviewed and updated.  Review of Systems  Per HPI unless specifically indicated above       Objective:    BP 130/84 (BP Location: Left Arm, Patient Position: Sitting, Cuff Size: Large)   Pulse (!) 103   Temp 98.3 F (36.8 C)   Ht 5\' 7"  (1.702 m)   Wt 218 lb 5 oz (99 kg)   SpO2 99%   BMI 34.19 kg/m   Wt Readings from Last 3 Encounters:  03/02/18 218 lb 5 oz (99 kg)  11/28/17 214 lb 6.4 oz (97.3 kg)  10/17/17 218 lb (98.9 kg)    Physical Exam Vitals signs and nursing note reviewed.  Constitutional:      Appearance: Normal appearance. She is obese. She is not ill-appearing.  HENT:     Head: Atraumatic.  Eyes:     Extraocular Movements: Extraocular movements intact.     Conjunctiva/sclera: Conjunctivae normal.  Neck:     Musculoskeletal: Normal range of motion and neck supple.  Cardiovascular:     Rate and Rhythm: Normal rate and regular rhythm.     Heart sounds: Normal heart sounds.  Pulmonary:     Effort: Pulmonary effort is normal.     Breath sounds: Normal breath sounds.  Musculoskeletal: Normal range of motion.  Skin:    General: Skin is warm and dry.  Neurological:     General: No focal deficit present.     Mental  Status: She is alert and oriented to person, place, and time.  Psychiatric:        Mood and Affect: Mood normal.        Thought Content: Thought content normal.        Judgment: Judgment normal.     Results for orders placed or performed in visit on 03/02/18  HgB A1c  Result Value Ref Range   Hgb A1c MFr Bld 5.9 (H) 4.8 - 5.6 %   Est. average glucose Bld gHb Est-mCnc 123 mg/dL  Comprehensive metabolic panel  Result Value Ref Range   Glucose 140 (H) 65 - 99 mg/dL   BUN 6 6 - 20 mg/dL   Creatinine, Ser 0.91 0.57 - 1.00 mg/dL   GFR calc non Af Amer 85 >59 mL/min/1.73   GFR calc Af Amer 98 >59 mL/min/1.73   BUN/Creatinine Ratio 7 (L) 9 - 23   Sodium 139 134 - 144 mmol/L   Potassium 4.2 3.5 - 5.2 mmol/L   Chloride 104 96 - 106 mmol/L   CO2 18 (L) 20 - 29 mmol/L   Calcium 9.7 8.7 - 10.2 mg/dL   Total Protein 7.7 6.0 - 8.5 g/dL    Albumin 4.8 3.9 - 5.0 g/dL   Globulin, Total 2.9 1.5 - 4.5 g/dL   Albumin/Globulin Ratio 1.7 1.2 - 2.2   Bilirubin Total <0.2 0.0 - 1.2 mg/dL   Alkaline Phosphatase 94 39 - 117 IU/L   AST 24 0 - 40 IU/L   ALT 34 (H) 0 - 32 IU/L      Assessment & Plan:   Problem List Items Addressed This Visit      Cardiovascular and Mediastinum   Essential hypertension    Stable, continue working on weight loss and DASH diet. Monitor home readings, call with persistent abnormals. Continue current regimen        Other   Bipolar disorder ([place])    Not under good control currently. Recommended Psychiatry consultation, pt not agreeable at this time. Recommended counseling or some medication swaps, pt does not wish to try something new in place of the zyprexa. Will continue to monitor for now and refer if she is agreeable      Obesity (BMI 30.0-34.9) - Primary    No change on the belviq, but pt wishes to continue. Continue current regimen. Declines dietician. Work on smaller snacks/meals throughout the day and working on regular exercise.       Relevant Orders   HgB A1c (Completed)   Comprehensive metabolic panel (Completed)       Follow up plan: Return in about 4 weeks (around 03/30/2018) for Mood f/u.

## 2018-03-02 NOTE — Telephone Encounter (Signed)
Copied from [place] [phone]. Topic: Quick Communication - See Telephone Encounter >> Mar 02, 2018 12:32 PM Rutherford Nail, [place] wrote: CRM for notification. See Telephone encounter for: 03/02/18. Patient calling and states that the lurasidone (LATUDA) 20 MG TABS tablet is going to cost over $2000. Would like to know if something else could be sent? CB#: [place], [place]

## 2018-03-03 LAB — COMPREHENSIVE METABOLIC PANEL
ALBUMIN: 4.8 g/dL (ref 3.9–5.0)
ALK PHOS: 94 IU/L (ref 39–117)
ALT: 34 IU/L — ABNORMAL HIGH (ref 0–32)
AST: 24 IU/L (ref 0–40)
Albumin/Globulin Ratio: 1.7 (ref 1.2–2.2)
BUN / CREAT RATIO: 7 — AB (ref 9–23)
BUN: 6 mg/dL (ref 6–20)
CO2: 18 mmol/L — ABNORMAL LOW (ref 20–29)
Calcium: 9.7 mg/dL (ref 8.7–10.2)
Chloride: 104 mmol/L (ref 96–106)
Creatinine, Ser: 0.91 mg/dL (ref 0.57–1.00)
GFR calc non Af Amer: 85 mL/min/{1.73_m2} (ref >59)
GFR, EST AFRICAN AMERICAN: 98 mL/min/{1.73_m2} (ref >59)
GLOBULIN, TOTAL: 2.9 g/dL (ref 1.5–4.5)
GLUCOSE: 140 mg/dL — AB (ref 65–99)
Potassium: 4.2 mmol/L (ref 3.5–5.2)
SODIUM: 139 mmol/L (ref 134–144)
TOTAL PROTEIN: 7.7 g/dL (ref 6.0–8.5)

## 2018-03-03 LAB — HEMOGLOBIN A1C
Est. average glucose Bld gHb Est-mCnc: 123 mg/dL
Hgb A1c MFr Bld: 5.9 % — ABNORMAL HIGH (ref 4.8–5.6)

## 2018-03-04 ENCOUNTER — Telehealth: Payer: Self-pay

## 2018-03-04 MED ORDER — ARIPIPRAZOLE 2 MG PO TABS: 2.0000 mg | ORAL_TABLET | Freq: Every day | ORAL | 0 refills | Status: DC

## 2018-03-04 NOTE — Telephone Encounter (Signed)
Please call pt and let her know that I've discussed her situation with another provider and we both agree that she needs to go to Psychiatry at this point instead of [place] adding more medicine as she's already on 3 agents and not under good control. I know this was a scheduling concern for her but let her know about [facility] walk in hours if things are a major issue.   Please cancel the latuda and abilify from the pharmacy and have her continue her current regimen until she can see psychiatry for further management  Copied from [place] [phone]. Topic: Quick Communication - See Telephone Encounter >> Mar 02, 2018 12:32 PM Rutherford Nail, [place] wrote: CRM for notification. See Telephone encounter for: 03/02/18. Patient calling and states that the lurasidone (LATUDA) 20 MG TABS tablet is going to cost over $2000. Would like to know if something else could be sent? CB#: [place] #[phone] - Phillip Heal, [place] [facility] [address] >> Mar 03, 2018  3:25 PM Mcneil, Jacinto Reap wrote: Pt stated she was told by her insurance that Risperidone and Ziprasidone are covered.

## 2018-03-04 NOTE — Telephone Encounter (Signed)
Message relayed to patient. Verbalized understanding and denied questions. RX was cancelled at [facility].

## 2018-03-06 NOTE — Assessment & Plan Note (Signed)
No change on the belviq, but pt wishes to continue. Continue current regimen. Declines dietician. Work on smaller snacks/meals throughout the day and working on regular exercise.

## 2018-03-06 NOTE — Telephone Encounter (Signed)
This was addressed in a different message  Copied from [place] [phone]. Topic: Quick Communication - See Telephone Encounter >> Mar 02, 2018 12:32 PM Rutherford Nail, [place] wrote: CRM for notification. See Telephone encounter for: 03/02/18. Patient calling and states that the lurasidone (LATUDA) 20 MG TABS tablet is going to cost over $2000. Would like to know if something else could be sent? CB#: [place] #[phone] - Phillip Heal, [place] [facility] [address] >> Mar 03, 2018  3:25 PM Mcneil, Jacinto Reap wrote: Pt stated she was told by her insurance that Risperidone and Ziprasidone are covered.

## 2018-03-06 NOTE — Assessment & Plan Note (Signed)
Not under good control currently. Recommended Psychiatry consultation, pt not agreeable at this time. Recommended counseling or some medication swaps, pt does not wish to try something new in place of the zyprexa. Will continue to monitor for now and refer if she is agreeable

## 2018-03-06 NOTE — Assessment & Plan Note (Signed)
Stable, continue working on weight loss and DASH diet. Monitor home readings, call with persistent abnormals. Continue current regimen

## 2018-04-03 ENCOUNTER — Other Ambulatory Visit: Payer: Self-pay

## 2018-04-03 ENCOUNTER — Encounter: Payer: Self-pay | Admitting: Family Medicine

## 2018-04-03 ENCOUNTER — Ambulatory Visit (INDEPENDENT_AMBULATORY_CARE_PROVIDER_SITE_OTHER): Payer: BLUE CROSS/BLUE SHIELD | Admitting: Family Medicine

## 2018-04-03 VITALS — BP 124/82 | HR 97 | Temp 98.5°F

## 2018-04-03 DIAGNOSIS — F419 Anxiety disorder, unspecified: Secondary | ICD-10-CM | POA: Diagnosis not present

## 2018-04-03 DIAGNOSIS — F3131 Bipolar disorder, current episode depressed, mild: Secondary | ICD-10-CM

## 2018-04-03 DIAGNOSIS — N6320 Unspecified lump in the left breast, unspecified quadrant: Secondary | ICD-10-CM

## 2018-04-03 MED ORDER — ARIPIPRAZOLE 2 MG PO TABS: 2.0000 mg | ORAL_TABLET | Freq: Every day | ORAL | 0 refills | Status: DC

## 2018-04-03 MED ORDER — HYDROXYZINE HCL 25 MG PO TABS: 25.0000 mg | ORAL_TABLET | Freq: Three times a day (TID) | ORAL | 0 refills | Status: DC | PRN

## 2018-04-03 MED ORDER — FLUOXETINE HCL 40 MG PO CAPS: 40.0000 mg | ORAL_CAPSULE | Freq: Every day | ORAL | 1 refills | Status: DC

## 2018-04-03 NOTE — Progress Notes (Signed)
BP 124/82   Pulse 97   Temp 98.5 F (36.9 C) (Oral)   SpO2 98%    Subjective:    Patient ID: Carrie Burke, female    DOB: [birth year], 28 y.o.   MRN: [id number]  HPI: Carrie Burke is a 28 y.o. female  Chief Complaint  Patient presents with  . Manic Behavior    4 week f/up   Here today for 1 month mood f/u. Came off the zyprexa about 2 weeks ago, still having some nausea from being off of it (came off cold [place]). States she feels better than she has for quite some time now that the medicine is coming out of her system. Thinks it was making her sleepy and gain weight. Currently just on the prozac which seems to do a good job with her moods. Still having some issues with anxiety. Denies SI/HI. Has not been able to get in with Psychiatry due to her schedule.   Also notes her husband found a small lump in her left breast to the right of her nipple a week or so ago. No known breast issues, nipple discharge, rashes. Does have a fhx of breast cancer in her grandmother who she reports was diagnosed in her 68s.   Depression screen [facility] 2/9 04/03/2018 03/02/2018 11/28/2017  Decreased Interest 0 1 0  Down, Depressed, Hopeless 0 3 1  PHQ - 2 Score 0 4 1  Altered sleeping 2 2 0  Tired, decreased energy 2 2 2   Change in appetite 1 1 0  Feeling bad or failure about yourself  0 0 1  Trouble concentrating 0 0 0  Moving slowly or fidgety/restless 0 1 0  Suicidal thoughts 0 0 0  PHQ-9 Score 5 10 4   Difficult doing work/chores - - -   GAD 7 : Generalized Anxiety Score 04/03/2018 03/02/2018 08/23/2016 04/07/2015  Nervous, Anxious, on Edge 1 3 3 3   Control/stop worrying 0 2 2 2   Worry too much - different things 1 2 3 2   Trouble relaxing 1 2 1 3   Restless 0 1 3 3   Easily annoyed or irritable 1 3 3 3   Afraid - awful might happen 0 0 2 2  Total GAD 7 Score 4 13 17 18   Anxiety Difficulty Somewhat difficult - Very difficult Somewhat difficult   Relevant past medical, surgical,  family and social history reviewed and updated as indicated. Interim medical history since our last visit reviewed. Allergies and medications reviewed and updated.  Review of Systems  Per HPI unless specifically indicated above     Objective:    BP 124/82   Pulse 97   Temp 98.5 F (36.9 C) (Oral)   SpO2 98%   Wt Readings from Last 3 Encounters:  03/02/18 218 lb 5 oz (99 kg)  11/28/17 214 lb 6.4 oz (97.3 kg)  10/17/17 218 lb (98.9 kg)    Physical Exam Vitals signs and nursing note reviewed.  Constitutional:      Appearance: Normal appearance. She is not ill-appearing.  HENT:     Head: Atraumatic.  Eyes:     Extraocular Movements: Extraocular movements intact.     Conjunctiva/sclera: Conjunctivae normal.  Neck:     Musculoskeletal: Normal range of motion and neck supple.  Cardiovascular:     Rate and Rhythm: Normal rate and regular rhythm.     Heart sounds: Normal heart sounds.  Pulmonary:     Effort: Pulmonary effort is normal.  Breath sounds: Normal breath sounds.  Chest:     Breasts:        Right: No mass, skin change or tenderness.        Left: Mass (small area of thickness at 9 0'clock about 0.5 cm from the nipple) present. No skin change or tenderness.  Musculoskeletal: Normal range of motion.  Skin:    General: Skin is warm and dry.  Neurological:     Mental Status: She is alert and oriented to person, place, and time.  Psychiatric:        Mood and Affect: Mood normal.        Thought Content: Thought content normal.        Judgment: Judgment normal.     Results for orders placed or performed in visit on 03/02/18  HgB A1c  Result Value Ref Range   Hgb A1c MFr Bld 5.9 (H) 4.8 - 5.6 %   Est. average glucose Bld gHb Est-mCnc 123 mg/dL  Comprehensive metabolic panel  Result Value Ref Range   Glucose 140 (H) 65 - 99 mg/dL   BUN 6 6 - 20 mg/dL   Creatinine, Ser 0.91 0.57 - 1.00 mg/dL   GFR calc non Af Amer 85 >59 mL/min/1.73   GFR calc Af Amer 98 >59  mL/min/1.73   BUN/Creatinine Ratio 7 (L) 9 - 23   Sodium 139 134 - 144 mmol/L   Potassium 4.2 3.5 - 5.2 mmol/L   Chloride 104 96 - 106 mmol/L   CO2 18 (L) 20 - 29 mmol/L   Calcium 9.7 8.7 - 10.2 mg/dL   Total Protein 7.7 6.0 - 8.5 g/dL   Albumin 4.8 3.9 - 5.0 g/dL   Globulin, Total 2.9 1.5 - 4.5 g/dL   Albumin/Globulin Ratio 1.7 1.2 - 2.2   Bilirubin Total <0.2 0.0 - 1.2 mg/dL   Alkaline Phosphatase 94 39 - 117 IU/L   AST 24 0 - 40 IU/L   ALT 34 (H) 0 - 32 IU/L      Assessment & Plan:   Problem List Items Addressed This Visit      Other   Bipolar disorder ([place]) - Primary    Wanting to stay off zyprexa, continue prozac and add abilify for mood stabilization. Recommended counseling, she will consider. Does not want to go to Psychiatry at this time       Other Visit Diagnoses    Anxiety       Difficult to tell whether situational because of increased stress or from medication changes. Prn hydroxyzine sent, f/u if not improving   Relevant Medications   hydrOXYzine (ATARAX/VISTARIL) 25 MG tablet   FLUoxetine (PROZAC) 40 MG capsule   Left breast mass       Appears benign, possibly glandular. Pt declines imaging at this time, will continue to monitor and will call if deciding she wants imaging       Follow up plan: Return in about 4 weeks (around 05/01/2018) for Mood f/u.

## 2018-04-10 NOTE — Assessment & Plan Note (Signed)
Wanting to stay off zyprexa, continue prozac and add abilify for mood stabilization. Recommended counseling, she will consider. Does not want to go to Psychiatry at this time

## 2018-04-18 ENCOUNTER — Other Ambulatory Visit: Payer: Self-pay | Admitting: Family Medicine

## 2018-04-20 ENCOUNTER — Other Ambulatory Visit: Payer: Self-pay

## 2018-04-20 ENCOUNTER — Encounter: Payer: Self-pay | Admitting: Family Medicine

## 2018-04-20 ENCOUNTER — Telehealth: Payer: Self-pay | Admitting: Family Medicine

## 2018-04-20 ENCOUNTER — Ambulatory Visit (INDEPENDENT_AMBULATORY_CARE_PROVIDER_SITE_OTHER): Payer: BLUE CROSS/BLUE SHIELD | Admitting: Family Medicine

## 2018-04-20 VITALS — BP 114/73 | HR 94 | Ht 66.0 in | Wt 211.0 lb

## 2018-04-20 DIAGNOSIS — J301 Allergic rhinitis due to pollen: Secondary | ICD-10-CM | POA: Diagnosis not present

## 2018-04-20 DIAGNOSIS — N6320 Unspecified lump in the left breast, unspecified quadrant: Secondary | ICD-10-CM | POA: Diagnosis not present

## 2018-04-20 MED ORDER — HYDROXYZINE HCL 25 MG PO TABS: 25.0000 mg | ORAL_TABLET | Freq: Three times a day (TID) | ORAL | 1 refills | Status: DC | PRN

## 2018-04-20 NOTE — Telephone Encounter (Signed)
Copied from [place] [phone]. Topic: Quick Communication - See Telephone Encounter >> Apr 20, 2018  8:40 AM Blase Mess A wrote: CRM for notification. See Telephone encounter for: 04/20/18.  Patient is calling because  she is has a lump in her left breast and discharge in both breast. Patient is requesting a virtual visit. However, if she needs to come into t. the office she states Friday would be the best day. CB- [phone] Thank you

## 2018-04-20 NOTE — Telephone Encounter (Signed)
Pt scheduled for virtual visit 

## 2018-04-20 NOTE — Progress Notes (Signed)
BP 114/73 (BP Location: Left Arm, Patient Position: Sitting, Cuff Size: Normal)   Pulse 94   Ht 5\' 6"  (1.676 m)   Wt 211 lb (95.7 kg)   BMI 34.06 kg/m    Subjective:    Patient ID: Carrie Burke, female    DOB: 01-11-1991, 28 y.o.   MRN: [id number]  HPI: Carrie Burke is a 28 y.o. female  Chief Complaint  Patient presents with  . Cough    Productive cough. Started off as dry. Patient states it's worse when she's outside. Ongoing 3 weeks (patient states since pollen emerged)  . Nasal Congestion    Green/Clear sputum  . Diarrhea    . This visit was completed via telephone due to the restrictions of the COVID-19 pandemic. All issues as above were discussed and addressed but no physical exam was performed. If it was felt that the patient should be evaluated in the office, they were directed there. The patient verbally consented to this visit. Patient was unable to complete an audio/visual visit due to Technical difficulties,Lack of internet. Due to the catastrophic nature of the COVID-19 pandemic, this visit was done through audio contact only. . Location of the patient: home . Location of the provider: home . Those involved with this call:  . Provider: [name], PA-C . CMA: Merilyn Baba, [place] . Front Desk/Registration: Jill Side  . Time spent on call: 24 minutes on the phone discussing health concerns   Still having several weeks of congestion and cough, which seemed to start when all the pollen started up outside. Cough began as a dry hack but now productive of green sputum. Marland Kitchen Has been using flonase with mild relief. Denies fever, chills, CP, SOB, body aches, N/V/D, sick contacts. Hx of allergic rhinitis.   3 days ago had some white discharge from both breasts and the lump in left breast has gotten bigger. LMP ended the day discharge happened. Has taken several home pregnancy tests which were both negative. Denies breast redness, rashes, fevers, chills. Fhx  of breast cancer in grandmother.   Relevant past medical, surgical, family and social history reviewed and updated as indicated. Interim medical history since our last visit reviewed. Allergies and medications reviewed and updated.  Review of Systems  Per HPI unless specifically indicated above     Objective:    BP 114/73 (BP Location: Left Arm, Patient Position: Sitting, Cuff Size: Normal)   Pulse 94   Ht 5\' 6"  (1.676 m)   Wt 211 lb (95.7 kg)   BMI 34.06 kg/m   Wt Readings from Last 3 Encounters:  04/20/18 211 lb (95.7 kg)  03/02/18 218 lb 5 oz (99 kg)  11/28/17 214 lb 6.4 oz (97.3 kg)    Physical Exam  Results for orders placed or performed in visit on 03/02/18  HgB A1c  Result Value Ref Range   Hgb A1c MFr Bld 5.9 (H) 4.8 - 5.6 %   Est. average glucose Bld gHb Est-mCnc 123 mg/dL  Comprehensive metabolic panel  Result Value Ref Range   Glucose 140 (H) 65 - 99 mg/dL   BUN 6 6 - 20 mg/dL   Creatinine, Ser 0.91 0.57 - 1.00 mg/dL   GFR calc non Af Amer 85 >59 mL/min/1.73   GFR calc Af Amer 98 >59 mL/min/1.73   BUN/Creatinine Ratio 7 (L) 9 - 23   Sodium 139 134 - 144 mmol/L   Potassium 4.2 3.5 - 5.2 mmol/L   Chloride 104 96 -  106 mmol/L   CO2 18 (L) 20 - 29 mmol/L   Calcium 9.7 8.7 - 10.2 mg/dL   Total Protein 7.7 6.0 - 8.5 g/dL   Albumin 4.8 3.9 - 5.0 g/dL   Globulin, Total 2.9 1.5 - 4.5 g/dL   Albumin/Globulin Ratio 1.7 1.2 - 2.2   Bilirubin Total <0.2 0.0 - 1.2 mg/dL   Alkaline Phosphatase 94 39 - 117 IU/L   AST 24 0 - 40 IU/L   ALT 34 (H) 0 - 32 IU/L      Assessment & Plan:   Problem List Items Addressed This Visit      Respiratory   Allergic rhinitis    Start antihistamine in addition to flonase, sinus rinses, mucinex, humidifier. F/u if worsening or not improving       Other Visit Diagnoses    Left breast mass    -  Primary   Will obtain imaging for further evaluation of growing left breast mass and monitor breast discharge closely. Will obtain  hormone panel if persisting   Relevant Orders   MM Digital Diagnostic Bilat   US BREAST LTD UNI LEFT INC AXILLA       Follow up plan: Return for as scheduled.

## 2018-04-20 NOTE — Telephone Encounter (Signed)
Pt is on way back into town, and can do an appt about 3-4 pm  cb is [phone]

## 2018-04-20 NOTE — Telephone Encounter (Signed)
Patient can do skype this afternoon 3 pm Thank  you

## 2018-04-20 NOTE — Telephone Encounter (Signed)
Patient states she has a lump in breast with discharge from both breast.  Please advise.  Thank you

## 2018-04-20 NOTE — Telephone Encounter (Signed)
OK to schedule virtual visit

## 2018-04-23 ENCOUNTER — Telehealth: Payer: Self-pay | Admitting: Family Medicine

## 2018-04-23 DIAGNOSIS — N6320 Unspecified lump in the left breast, unspecified quadrant: Secondary | ICD-10-CM

## 2018-04-23 DIAGNOSIS — J309 Allergic rhinitis, unspecified: Secondary | ICD-10-CM | POA: Insufficient documentation

## 2018-04-23 NOTE — Assessment & Plan Note (Signed)
Start antihistamine in addition to flonase, sinus rinses, mucinex, humidifier. F/u if worsening or not improving

## 2018-04-23 NOTE — Telephone Encounter (Signed)
Please call and let them know 9'oclock, and I will re-do the diagnostic mamm order  Copied from [place] [phone]. Topic: Referral - Question >> Apr 23, 2018 12:48 PM Parke Poisson wrote: Reason for CRM: I will need to know clock position of breast mass before scheduling ultrasound.Per [facility] all diagnostic mammograms must be TOMO

## 2018-04-23 NOTE — Telephone Encounter (Signed)
Sarah notified.  

## 2018-04-29 ENCOUNTER — Other Ambulatory Visit: Payer: Self-pay

## 2018-04-29 ENCOUNTER — Ambulatory Visit
Admission: RE | Admit: 2018-04-29 | Discharge: 2018-04-29 | Disposition: A | Discharge status: Home / Self Care | Payer: BLUE CROSS/BLUE SHIELD | Source: Ambulatory Visit | Attending: Family Medicine | Admitting: Family Medicine

## 2018-04-29 ENCOUNTER — Other Ambulatory Visit: Payer: Self-pay | Admitting: Family Medicine

## 2018-04-29 ENCOUNTER — Telehealth: Payer: Self-pay | Admitting: Neurology

## 2018-04-29 DIAGNOSIS — N6321 Unspecified lump in the left breast, upper outer quadrant: Secondary | ICD-10-CM | POA: Diagnosis not present

## 2018-04-29 DIAGNOSIS — N6320 Unspecified lump in the left breast, unspecified quadrant: Secondary | ICD-10-CM

## 2018-04-29 DIAGNOSIS — N6452 Nipple discharge: Secondary | ICD-10-CM | POA: Insufficient documentation

## 2018-04-29 NOTE — Telephone Encounter (Signed)
This is a Dr. Jannifer Franklin patient scheduled for June. She was supposed to be seen months ago by an NP but never followed up. This patient can be called and offered sooner appointment with Judson Roch NP via virtual visit.

## 2018-04-30 ENCOUNTER — Other Ambulatory Visit: Payer: Self-pay | Admitting: Family Medicine

## 2018-04-30 DIAGNOSIS — N6320 Unspecified lump in the left breast, unspecified quadrant: Secondary | ICD-10-CM

## 2018-04-30 DIAGNOSIS — R928 Other abnormal and inconclusive findings on diagnostic imaging of breast: Secondary | ICD-10-CM

## 2018-04-30 MED ORDER — SULFAMETHOXAZOLE-TRIMETHOPRIM 800-160 MG PO TABS: 1.0000 | ORAL_TABLET | Freq: Two times a day (BID) | ORAL | 0 refills | Status: DC

## 2018-04-30 NOTE — Telephone Encounter (Signed)
Requested medication (s) are due for refill today: yes  Requested medication (s) are on the active medication list: yes  Last refill: 04/03/2018  Future visit scheduled:yes  Notes to clinic: No delegated    Requested Prescriptions  Pending Prescriptions Disp Refills   ARIPiprazole (ABILIFY) 2 MG tablet [Pharmacy Med Name: ARIPIPRAZOLE 2MG  TABLETS] 30 tablet 0    Sig: TAKE 1 TABLET(2 MG) BY MOUTH DAILY     Not Delegated - Psychiatry:  Antipsychotics - Second Generation (Atypical) - aripiprazole Failed - 04/30/2018 10:28 AM      Failed - This refill cannot be delegated      Passed - Valid encounter within last 6 months    Recent Outpatient Visits          1 week ago Left breast mass   [facility] Volney American, PA-C   3 weeks ago Bipolar affective disorder, currently depressed, mild [facility])   [facility], [place], [place]   1 month ago Obesity (BMI 30.0-34.9)   [place], [place], [place]   5 months ago Bipolar affective disorder, currently depressed, mild ([place])   [facility] Kathrine Haddock, NP   6 months ago Sleep concern   [facility] Volney American, [place]      Future Appointments            In 1 week Orene Desanctis, Lilia Argue, [place], [place]

## 2018-04-30 NOTE — Telephone Encounter (Signed)
Tried to return call, could not leave VM. Please let her know I sent over 2 weeks of abx per recommendation of radiologist who interpreted the mammogram and u/s for probable breast abscess  Copied from [place] #[id number]. Topic: General - Inquiry >> Apr 30, 2018 11:10 AM Rainey Pines A wrote: Reason for CRM: Patient stated that she would like a callback from her PCP in regards to the antibiotics that were discussed during her last visit. Best contact number [phone]

## 2018-05-01 ENCOUNTER — Telehealth: Payer: Self-pay

## 2018-05-01 NOTE — Telephone Encounter (Signed)
Patient would like a call from you to discuss the mammogram and US findings.

## 2018-05-01 NOTE — Telephone Encounter (Signed)
Patient notified

## 2018-05-01 NOTE — Telephone Encounter (Signed)
Tried to call patient, no answer, unable to leave a message. Will try again.

## 2018-05-01 NOTE — Telephone Encounter (Signed)
Called pt and discussed findings, answered all questions

## 2018-05-07 NOTE — Telephone Encounter (Signed)
Patient is scheduled sooner with Judson Roch.

## 2018-05-08 ENCOUNTER — Encounter: Payer: Self-pay | Admitting: Family Medicine

## 2018-05-08 ENCOUNTER — Other Ambulatory Visit: Payer: Self-pay

## 2018-05-08 ENCOUNTER — Ambulatory Visit (INDEPENDENT_AMBULATORY_CARE_PROVIDER_SITE_OTHER): Payer: BLUE CROSS/BLUE SHIELD | Admitting: Family Medicine

## 2018-05-08 VITALS — Ht 67.0 in | Wt 211.0 lb

## 2018-05-08 DIAGNOSIS — G4700 Insomnia, unspecified: Secondary | ICD-10-CM

## 2018-05-08 DIAGNOSIS — F419 Anxiety disorder, unspecified: Secondary | ICD-10-CM | POA: Diagnosis not present

## 2018-05-08 DIAGNOSIS — G4733 Obstructive sleep apnea (adult) (pediatric): Secondary | ICD-10-CM

## 2018-05-08 DIAGNOSIS — F3131 Bipolar disorder, current episode depressed, mild: Secondary | ICD-10-CM | POA: Diagnosis not present

## 2018-05-08 MED ORDER — HYDROXYZINE HCL 50 MG PO TABS: 50.0000 mg | ORAL_TABLET | Freq: Every evening | ORAL | 0 refills | Status: DC | PRN

## 2018-05-08 NOTE — Progress Notes (Signed)
Ht 5\' 7"  (1.702 m)   Wt 211 lb (95.7 kg)   LMP 04/14/2018   BMI 33.05 kg/m    Subjective:    Patient ID: Carrie Burke, female    DOB: March 30, 1990, 28 y.o.   MRN: [id number]  HPI: Carrie Burke is a 28 y.o. female  Chief Complaint  Patient presents with  . Bipolar, depressed    Patient still having trouble sleeping. 1 month F/U  . Anxiety   . This visit was completed via WebEx due to the restrictions of the COVID-19 pandemic. All issues as above were discussed and addressed. Physical exam was done as above through visual confirmation on WebEx. If it was felt that the patient should be evaluated in the office, they were directed there. The patient verbally consented to this visit. . Location of the patient: in her car . Location of the provider: work . Those involved with this call:  . Provider: [name], PA-C . CMA: Yvonna Alanis, [place] . Front Desk/Registration: Jill Side  . Time spent on call: 25 minutes with patient face to face via video conference. More than 50% of this time was spent in counseling and coordination of care. 5 minutes total spent in review of patient's record and preparation of their chart.   I verified patient identity using two factors (patient name and date of birth). Patient consents verbally to being seen via telemedicine visit today.   Patient here today for 1 month mood and anxiety f/u. Moods stable on abilify and prozac. Denies significant mood swings, side effects, SI/HI, anhedonia. Hydroxyzine helping as needed for anxiety, still having breakthrough sxs but not severely. Main concern today is she's not sleeping well still. Sometimes trazodone helps, sometimes hydroxyzine helps but other nights she does not rest well at all. Of note, was dx'd with sleep apnea on recent sleep study but had recently had a lapse in insurance so has not yet been able to obtain a CPAP machine.   Depression screen [facility] 2/9 05/08/2018 04/03/2018 03/02/2018   Decreased Interest 1 0 1  Down, Depressed, Hopeless 1 0 3  PHQ - 2 Score 2 0 4  Altered sleeping 3 2 2   Tired, decreased energy 2 2 2   Change in appetite 0 1 1  Feeling bad or failure about yourself  1 0 0  Trouble concentrating 0 0 0  Moving slowly or fidgety/restless 1 0 1  Suicidal thoughts 0 0 0  PHQ-9 Score 9 5 10   Difficult doing work/chores Not difficult at all - -  Some recent data might be hidden   GAD 7 : Generalized Anxiety Score 05/08/2018 04/03/2018 03/02/2018 08/23/2016  Nervous, Anxious, on Edge 1 1 3 3   Control/stop worrying 1 0 2 2  Worry too much - different things 1 1 2 3   Trouble relaxing 0 1 2 1   Restless 0 0 1 3  Easily annoyed or irritable 1 1 3 3   Afraid - awful might happen 1 0 0 2  Total GAD 7 Score 5 4 13 17   Anxiety Difficulty Not difficult at all Somewhat difficult - Very difficult     Relevant past medical, surgical, family and social history reviewed and updated as indicated. Interim medical history since our last visit reviewed. Allergies and medications reviewed and updated.  Review of Systems  Per HPI unless specifically indicated above     Objective:    Ht 5\' 7"  (1.702 m)   Wt 211 lb (95.7 kg)  LMP 04/14/2018   BMI 33.05 kg/m   Wt Readings from Last 3 Encounters:  05/08/18 211 lb (95.7 kg)  04/20/18 211 lb (95.7 kg)  03/02/18 218 lb 5 oz (99 kg)    Physical Exam Vitals signs and nursing note reviewed.  Constitutional:      General: She is not in acute distress.    Appearance: Normal appearance.  HENT:     Head: Atraumatic.     Right Ear: External ear normal.     Left Ear: External ear normal.     Nose: Nose normal. No congestion.     Mouth/Throat:     Mouth: Mucous membranes are moist.     Pharynx: Oropharynx is clear. No posterior oropharyngeal erythema.  Eyes:     Extraocular Movements: Extraocular movements intact.     Conjunctiva/sclera: Conjunctivae normal.  Neck:     Musculoskeletal: Normal range of motion.   Cardiovascular:     Comments: Unable to assess via virtual visit Pulmonary:     Effort: Pulmonary effort is normal. No respiratory distress.  Musculoskeletal: Normal range of motion.  Skin:    General: Skin is dry.     Findings: No erythema.  Neurological:     Mental Status: She is alert and oriented to person, place, and time.  Psychiatric:        Mood and Affect: Mood normal.        Thought Content: Thought content normal.        Judgment: Judgment normal.     Results for orders placed or performed in visit on 03/02/18  HgB A1c  Result Value Ref Range   Hgb A1c MFr Bld 5.9 (H) 4.8 - 5.6 %   Est. average glucose Bld gHb Est-mCnc 123 mg/dL  Comprehensive metabolic panel  Result Value Ref Range   Glucose 140 (H) 65 - 99 mg/dL   BUN 6 6 - 20 mg/dL   Creatinine, Ser 0.91 0.57 - 1.00 mg/dL   GFR calc non Af Amer 85 >59 mL/min/1.73   GFR calc Af Amer 98 >59 mL/min/1.73   BUN/Creatinine Ratio 7 (L) 9 - 23   Sodium 139 134 - 144 mmol/L   Potassium 4.2 3.5 - 5.2 mmol/L   Chloride 104 96 - 106 mmol/L   CO2 18 (L) 20 - 29 mmol/L   Calcium 9.7 8.7 - 10.2 mg/dL   Total Protein 7.7 6.0 - 8.5 g/dL   Albumin 4.8 3.9 - 5.0 g/dL   Globulin, Total 2.9 1.5 - 4.5 g/dL   Albumin/Globulin Ratio 1.7 1.2 - 2.2   Bilirubin Total <0.2 0.0 - 1.2 mg/dL   Alkaline Phosphatase 94 39 - 117 IU/L   AST 24 0 - 40 IU/L   ALT 34 (H) 0 - 32 IU/L      Assessment & Plan:   Problem List Items Addressed This Visit      Respiratory   Sleep apnea    Work on weight loss, sleep hygiene. Call sleep center and request CPAP now that medical insurance is active again        Other   Bipolar disorder ([place])    Chronic, under fairly good control now with current regimen. Continue present medications. Referral to counseling sent as adjunct to her medication regimen      Relevant Orders   Ambulatory referral to Psychology   Insomnia    Difficult to differentiate currently if sleep issues are more from  uncontrolled sleep apnea or her anxiety/mood issues. Will  increase hydroxyzine to 50 mg and monitor for benefit. Do not take trazodone on nights taking the hydroxyzine.Pt to call sleep center now that she has insurance to try and obtain CPAP.  Counseling referral sent, hopefully can gain some tools to help with insomnia there      Relevant Orders   Ambulatory referral to Psychology    Other Visit Diagnoses    Anxiety    -  Primary   Hydroxyzine prn working fairly well, continue current regimen and await establishing with counseling   Relevant Medications   hydrOXYzine (ATARAX/VISTARIL) 50 MG tablet   Other Relevant Orders   Ambulatory referral to Psychology       Follow up plan: Return in about 4 weeks (around 06/05/2018) for mood, sleep f/u.

## 2018-05-11 ENCOUNTER — Encounter: Payer: Self-pay | Admitting: Neurology

## 2018-05-11 ENCOUNTER — Other Ambulatory Visit: Payer: Self-pay

## 2018-05-11 ENCOUNTER — Encounter

## 2018-05-11 ENCOUNTER — Ambulatory Visit (INDEPENDENT_AMBULATORY_CARE_PROVIDER_SITE_OTHER): Payer: BLUE CROSS/BLUE SHIELD | Admitting: Neurology

## 2018-05-11 DIAGNOSIS — G43019 Migraine without aura, intractable, without status migrainosus: Secondary | ICD-10-CM | POA: Diagnosis not present

## 2018-05-11 MED ORDER — ERENUMAB-AOOE 140 MG/ML ~~LOC~~ SOAJ: 140.0000 mg | SUBCUTANEOUS | 3 refills | Status: DC

## 2018-05-11 MED ORDER — BACLOFEN 10 MG PO TABS: 5.0000 mg | ORAL_TABLET | Freq: Two times a day (BID) | ORAL | 1 refills | Status: DC

## 2018-05-11 MED ORDER — ERENUMAB-AOOE 70 MG/ML ~~LOC~~ SOAJ: 140.0000 mg | SUBCUTANEOUS | 3 refills | Status: DC

## 2018-05-11 MED ORDER — RIZATRIPTAN BENZOATE 10 MG PO TABS: 10.0000 mg | ORAL_TABLET | Freq: Three times a day (TID) | ORAL | 3 refills | Status: DC | PRN

## 2018-05-11 NOTE — Progress Notes (Signed)
Virtual Visit via Video Note  I connected with [name] on 05/11/18 at  2:15 PM EDT by a video enabled telemedicine application and verified that I am speaking with the correct person using two identifiers.   I discussed the limitations of evaluation and management by telemedicine and the availability of in person appointments. The patient expressed understanding and agreed to proceed.  History of Present Illness: 05/11/2018 SS: [name] is a 28 year old female with history of bipolar disorder and migraine headache.  She is currently taking gabapentin 300 mg in the morning, 900 mg at bedtime.  She continues to complain of trouble with her gait, feeling off balance.  She denies any falls.  In the past she has been unable to do physical therapy due to not being able to get off work.  She reports daily headaches, gets worse throughout the day. She reports her headaches generally occur to the back of her head, down her neck.  She denies any nausea or vomiting.  She reports baclofen, and Maxalt are generally helpful.  She had MRI of the brain in March 2019, showing left CP angle mass consistent with lipoma that is encasing the 7th and 8th cranial nerves.  She has tried Topamax in the past, had side effect of tingling in her fingers.  She denies any changes to her medications or medical history.  She denies any new problems or concerns.   08/28/2017 Dr. Jannifer Franklin: [name] is a 28 year old right-handed white female with a history of bipolar disorder and intractable migraine headache.  MRI of the brain has shown a left CP angle mass consistent with lipoma that is encasing the 7th and 8th cranial nerves.  The mass does not impinge upon the brainstem.  The patient continues to have ongoing virtually daily headaches.  She was given a trial on Topamax but did not seem to tolerate the medication well, she is now on 50 mg at night but the medication has not offered much benefit with her headaches.  She  was placed on gabapentin taking 300 mg in the morning and 600 mg in the evening, she is having some drowsiness on the medication.  She still has not gained much benefit with her headache.  She is not mentioning much work from due to the headache, she does have some slight nausea but this is not significant.  She takes Maxalt for the headache with some improvement.  The patient drinks 1 cup of coffee in the day, one energy drink, and 1 caffeinated soft drink.  The patient continues to have ongoing problems with gait instability, she has fallen on one occasion 2 to 3 weeks ago.  She returns for an evaluation.   Observations/Objective: Alert, answers questions appropriately, speech is clear and concise, follows commands, facial symmetry noted, no arm drift, gait is intact, mildly unsteady  Assessment and Plan: 1.  Migraine headache 2.  Gait disturbance 3. Left CP angle lipoma   She continues to complain of daily migraine headaches.  We will start Aimovig 140 mg monthly injection.  We reviewed the side effects.  She will continue taking gabapentin 300 mg in the morning, 900 mg at bedtime.  She will continue taking baclofen, Maxalt as needed for acute headache.  In the past she has tried Topamax.  For migraine prevention, she is not a candidate for antidepressants, she is already taking multiple medications.  Beta-blockers are not ideal as she has history of bipolar disorder and depression.  She  had an MRI of the brain in March 2019, showing left cerebellopontine angle mass consistent with a lipoma, it was encasing the 7th and 8th cranial nerves.  This was to be repeated in 6 months, but she was unable to get study done.  She would like to wait to get the study until the mobile MRI machine is back at our office.  The MRI of the brain will be with contrast, will need to get a kidney function before the study. I will wait to order the MRI until the machine is at our office. I believe it will be within the next  month. The patient will call to check in within the next month if she hasn't heard from [place]. Our MRI scheduler has made a note to contact her.   Follow Up Instructions: 3-4 months for revisit   I discussed the assessment and treatment plan with the patient. The patient was provided an opportunity to ask questions and all were answered. The patient agreed with the plan and demonstrated an understanding of the instructions.   The patient was advised to call back or seek an in-person evaluation if the symptoms worsen or if the condition fails to improve as anticipated.  I provided 25 minutes of non-face-to-face time during this encounter.   Evangeline Dakin, DNP  [facility] [address], [place], [place] [address] [phone]

## 2018-05-11 NOTE — Progress Notes (Signed)
I have read the note, and I agree with the clinical assessment and plan.  Charles K Willis   

## 2018-05-13 DIAGNOSIS — G4730 Sleep apnea, unspecified: Secondary | ICD-10-CM | POA: Insufficient documentation

## 2018-05-13 NOTE — Assessment & Plan Note (Addendum)
Difficult to differentiate currently if sleep issues are more from uncontrolled sleep apnea or her anxiety/mood issues. Will increase hydroxyzine to 50 mg and monitor for benefit. Do not take trazodone on nights taking the hydroxyzine.Pt to call sleep center now that she has insurance to try and obtain CPAP.  Counseling referral sent, hopefully can gain some tools to help with insomnia there

## 2018-05-13 NOTE — Assessment & Plan Note (Signed)
Chronic, under fairly good control now with current regimen. Continue present medications. Referral to counseling sent as adjunct to her medication regimen

## 2018-05-13 NOTE — Assessment & Plan Note (Signed)
Work on weight loss, sleep hygiene. Call sleep center and request CPAP now that medical insurance is active again

## 2018-05-15 ENCOUNTER — Other Ambulatory Visit: Payer: Self-pay | Admitting: Family Medicine

## 2018-05-18 ENCOUNTER — Telehealth: Payer: BLUE CROSS/BLUE SHIELD | Admitting: Physician Assistant

## 2018-05-18 ENCOUNTER — Encounter: Payer: Self-pay | Admitting: Physician Assistant

## 2018-05-18 DIAGNOSIS — N926 Irregular menstruation, unspecified: Secondary | ICD-10-CM

## 2018-05-18 DIAGNOSIS — R109 Unspecified abdominal pain: Secondary | ICD-10-CM | POA: Diagnosis not present

## 2018-05-18 DIAGNOSIS — N898 Other specified noninflammatory disorders of vagina: Secondary | ICD-10-CM | POA: Diagnosis not present

## 2018-05-18 NOTE — Progress Notes (Signed)
Based on what you shared with me, I feel your condition warrants further evaluation and I recommend that you be seen for a face to face office visit.   Carrie Burke,  Your symptoms warrant further workup to include pregnancy testing.     NOTE: If you entered your credit card information for this eVisit, you will not be charged. You may see a "hold" on your card for the $35 but that hold will drop off and you will not have a charge processed.  If you are having a true medical emergency please call 911.  If you need an urgent face to face visit, [place] [facility] has four urgent care centers for your convenience.    PLEASE NOTE: THE [facility] LOCATIONS AND URGENT CARE CLINICS DO NOT HAVE THE TESTING FOR CORONAVIRUS COVID19 AVAILABLE.  IF YOU FEEL YOU NEED THIS TEST YOU MUST GO TO A TRIAGE LOCATION AT [place]   [email or web address] to reserve your spot online an avoid wait times  [facility] [address] [place], [place] [address] Modified hours of operation: Monday-Friday, 12 PM to 6 PM  Saturday & Sunday 10 AM to 4 PM *Across the street from [place] (New Address!) [address] [place], [place] [address] *Just off [address], across the road from [place] hours of operation: Monday-Friday, 12 PM to 6 PM  Closed Saturday & Sunday  [facility]'s modified hours of operation will be in effect from May 1 until May 31   The following sites will take your insurance:  . [facility] [place] a Provider at this Location  [address] [place], [place] [address] . 10 am to 8 pm Monday-Friday . 12 pm to 8 pm Saturday-Sunday   . [facility] at [place] a Provider at this Location  [place], [place], [place] [address] . 8 am to 8  pm Monday-Friday . 9 am to 6 pm Saturday . 11 am to 6 pm Sunday   . [facility] at [place] Get Driving Directions  [address] [place], [place] [address] . 8 am to 8 pm Monday-Friday . 8 am to 4 pm Saturday-Sunday   Your e-visit answers were reviewed by a board certified advanced clinical practitioner to complete your personal care plan.  Thank you for using e-Visits. I have spent 7 min in completion and review of this note- [facility]

## 2018-05-19 ENCOUNTER — Ambulatory Visit (INDEPENDENT_AMBULATORY_CARE_PROVIDER_SITE_OTHER): Payer: BLUE CROSS/BLUE SHIELD | Admitting: Family Medicine

## 2018-05-19 ENCOUNTER — Other Ambulatory Visit: Payer: BLUE CROSS/BLUE SHIELD

## 2018-05-19 ENCOUNTER — Encounter: Payer: Self-pay | Admitting: Family Medicine

## 2018-05-19 ENCOUNTER — Other Ambulatory Visit: Payer: Self-pay

## 2018-05-19 DIAGNOSIS — N926 Irregular menstruation, unspecified: Secondary | ICD-10-CM | POA: Diagnosis not present

## 2018-05-19 DIAGNOSIS — N63 Unspecified lump in unspecified breast: Secondary | ICD-10-CM

## 2018-05-19 NOTE — Progress Notes (Signed)
There were no vitals taken for this visit.   Subjective:    Patient ID: Carrie Burke, female    DOB: 1991-01-12, 28 y.o.   MRN: [id number]  HPI: Carrie Burke is a 28 y.o. female  Chief Complaint  Patient presents with  . Amenorrhea    Last period was March 30th and patient stated she only lasted 3 days. Pregnancy test negative. Bloating and breast tenderness. Would like a blood pregnancy test.    . This visit was completed via WebEx due to the restrictions of the COVID-19 pandemic. All issues as above were discussed and addressed. Physical exam was done as above through visual confirmation on WebEx. If it was felt that the patient should be evaluated in the office, they were directed there. The patient verbally consented to this visit. . Location of the patient: home . Location of the provider: home . Those involved with this call:  . Provider: [name], PA-C . CMA: Merilyn Baba, [place] . Front Desk/Registration: Jill Side  . Time spent on call: 15 minutes with patient face to face via video conference. More than 50% of this time was spent in counseling and coordination of care. 5 minutes total spent in review of patient's record and preparation of their chart. I verified patient identity using two factors (patient name and date of birth). Patient consents verbally to being seen via telemedicine visit today.   Patient presenting today with concern of a late period, bloating, breast tenderness. LMP 5 weeks ago, and only lasted 3 days. Hx of tubal ligation, but does have unprotected intercourse and states she knows that isn't 100% guaranteed contraception. Home pregnancy tests negative x 2. Of note, she has been dealing with workup for breast cysts/abscesses the past month or so and is awaiting repeat imaging post abx. Denies fevers, chills, N/V, vaginal discharge, urinary sxs, cramping, concern for STIs.   Relevant past medical, surgical, family and social history  reviewed and updated as indicated. Interim medical history since our last visit reviewed. Allergies and medications reviewed and updated.  Review of Systems  Per HPI unless specifically indicated above     Objective:    There were no vitals taken for this visit.  Wt Readings from Last 3 Encounters:  05/08/18 211 lb (95.7 kg)  04/20/18 211 lb (95.7 kg)  03/02/18 218 lb 5 oz (99 kg)    Physical Exam Vitals signs and nursing note reviewed.  Constitutional:      General: She is not in acute distress.    Appearance: Normal appearance.  HENT:     Head: Atraumatic.     Right Ear: External ear normal.     Left Ear: External ear normal.     Nose: Nose normal. No congestion.     Mouth/Throat:     Mouth: Mucous membranes are moist.     Pharynx: Oropharynx is clear. No posterior oropharyngeal erythema.  Eyes:     Extraocular Movements: Extraocular movements intact.     Conjunctiva/sclera: Conjunctivae normal.  Neck:     Musculoskeletal: Normal range of motion.  Cardiovascular:     Comments: Unable to assess via virtual visit Pulmonary:     Effort: Pulmonary effort is normal. No respiratory distress.  Abdominal:     Tenderness: There is no abdominal tenderness (nontender on patient's self abdominal palpation).  Musculoskeletal: Normal range of motion.  Skin:    General: Skin is dry.     Findings: No erythema.  Neurological:  Mental Status: She is alert and oriented to person, place, and time.  Psychiatric:        Mood and Affect: Mood normal.        Thought Content: Thought content normal.        Judgment: Judgment normal.     Results for orders placed or performed in visit on 03/02/18  HgB A1c  Result Value Ref Range   Hgb A1c MFr Bld 5.9 (H) 4.8 - 5.6 %   Est. average glucose Bld gHb Est-mCnc 123 mg/dL  Comprehensive metabolic panel  Result Value Ref Range   Glucose 140 (H) 65 - 99 mg/dL   BUN 6 6 - 20 mg/dL   Creatinine, Ser 0.91 0.57 - 1.00 mg/dL   GFR calc  non Af Amer 85 >59 mL/min/1.73   GFR calc Af Amer 98 >59 mL/min/1.73   BUN/Creatinine Ratio 7 (L) 9 - 23   Sodium 139 134 - 144 mmol/L   Potassium 4.2 3.5 - 5.2 mmol/L   Chloride 104 96 - 106 mmol/L   CO2 18 (L) 20 - 29 mmol/L   Calcium 9.7 8.7 - 10.2 mg/dL   Total Protein 7.7 6.0 - 8.5 g/dL   Albumin 4.8 3.9 - 5.0 g/dL   Globulin, Total 2.9 1.5 - 4.5 g/dL   Albumin/Globulin Ratio 1.7 1.2 - 2.2   Bilirubin Total <0.2 0.0 - 1.2 mg/dL   Alkaline Phosphatase 94 39 - 117 IU/L   AST 24 0 - 40 IU/L   ALT 34 (H) 0 - 32 IU/L      Assessment & Plan:   Problem List Items Addressed This Visit    None    Visit Diagnoses    Missed period    -  Primary   Low suspicion for pregnancy, but will obtain beta hcg for reassurance per pt request. Continue to monitor, f/u with worsening sxs   Relevant Orders   Beta hCG quant (ref lab) (Completed)   Breast mass       Suspect this is why she's having breast tenderness, which has been ongoing/intermittent the past few months       Follow up plan: Return for as scheduled.

## 2018-05-20 ENCOUNTER — Telehealth: Payer: Self-pay | Admitting: Neurology

## 2018-05-20 DIAGNOSIS — R51 Headache: Principal | ICD-10-CM

## 2018-05-20 DIAGNOSIS — G8929 Other chronic pain: Secondary | ICD-10-CM

## 2018-05-20 LAB — BETA HCG QUANT (REF LAB): hCG Quant: <1 m[IU]/mL

## 2018-05-20 NOTE — Telephone Encounter (Signed)
Our office is beginning to start scheduling MRIs.  I have placed the order for MRI, and BMP to check kidney function before MRI.

## 2018-05-27 ENCOUNTER — Telehealth: Payer: Self-pay | Admitting: Neurology

## 2018-05-27 ENCOUNTER — Other Ambulatory Visit: Payer: Self-pay | Admitting: Family Medicine

## 2018-05-27 NOTE — Telephone Encounter (Signed)
Requested medication (s) are due for refill today: Yes  Requested medication (s) are on the active medication list: Yes  Last refill:  04/30/18  Future visit scheduled: Yes  Notes to clinic: Unable to refill, cannot delegate     Requested Prescriptions  Pending Prescriptions Disp Refills   ARIPiprazole (ABILIFY) 2 MG tablet [Pharmacy Med Name: ARIPIPRAZOLE 2MG  TABLETS] 30 tablet 0    Sig: TAKE 1 TABLET(2 MG) BY MOUTH DAILY     Not Delegated - Psychiatry:  Antipsychotics - Second Generation (Atypical) - aripiprazole Failed - 05/27/2018 10:24 AM      Failed - This refill cannot be delegated      Passed - Valid encounter within last 6 months    Recent Outpatient Visits          1 week ago Missed period   [facility], [place], [place]   2 weeks ago [place], [place], [place]   1 month ago Left breast mass   [place], [place], [place]   1 month ago Bipolar affective disorder, currently depressed, mild [facility])   [place], [place], [place]   2 months ago Obesity (BMI 30.0-34.9)   [facility] [place], [place]      Future Appointments            In 2 weeks Orene Desanctis, [place], PA-C [facility], [place]

## 2018-05-27 NOTE — Telephone Encounter (Signed)
no to the covid-19 questions MR Brain w/wo contrast [place]: [id number] (exp. 05/27/18 to 08/24/18). Patient is scheduled at [facility] for 06/02/18

## 2018-05-29 ENCOUNTER — Ambulatory Visit (INDEPENDENT_AMBULATORY_CARE_PROVIDER_SITE_OTHER): Payer: BLUE CROSS/BLUE SHIELD | Admitting: Licensed Clinical Social Worker

## 2018-05-29 ENCOUNTER — Other Ambulatory Visit: Payer: Self-pay

## 2018-05-29 DIAGNOSIS — F310 Bipolar disorder, current episode hypomanic: Secondary | ICD-10-CM | POA: Diagnosis not present

## 2018-05-29 NOTE — Progress Notes (Signed)
[place] Initial Clinical Assessment  MRN: [id number] NAME: Carrie Burke Date: 05/29/18  Start time: 9A Total time: 1 hour  Type of Contact:  virtual Patient consent obtained:  yes Reason for Visit today:  establish care  Treatment History Patient recently received Inpatient Treatment:  denies  Facility/Program:    Date of discharge:   Patient currently being seen by therapist/psychiatrist:  denies Patient currently receiving the following services:  denies any current [place] service. Receives medication from PCP  Past Psychiatric History/Hospitalization(s): Anxiety: Yes Bipolar Disorder: Yes Depression: Yes Mania: No Psychosis: No Schizophrenia: No Personality Disorder: No Hospitalization for psychiatric illness: No History of Electroconvulsive Shock Therapy: No Prior Suicide Attempts: No  Clinical Assessment:  PHQ-9 Assessments: Depression screen [facility] 2/9 05/08/2018 04/03/2018 03/02/2018  Decreased Interest 1 0 1  Down, Depressed, Hopeless 1 0 3  PHQ - 2 Score 2 0 4  Altered sleeping 3 2 2   Tired, decreased energy 2 2 2   Change in appetite 0 1 1  Feeling bad or failure about yourself  1 0 0  Trouble concentrating 0 0 0  Moving slowly or fidgety/restless 1 0 1  Suicidal thoughts 0 0 0  PHQ-9 Score 9 5 10   Difficult doing work/chores Not difficult at all - -  Some recent data might be hidden    GAD-7 Assessments: GAD 7 : Generalized Anxiety Score 05/08/2018 04/03/2018 03/02/2018 08/23/2016  Nervous, Anxious, on Edge 1 1 3 3   Control/stop worrying 1 0 2 2  Worry too much - different things 1 1 2 3   Trouble relaxing 0 1 2 1   Restless 0 0 1 3  Easily annoyed or irritable 1 1 3 3   Afraid - awful might happen 1 0 0 2  Total GAD 7 Score 5 4 13 17   Anxiety Difficulty Not difficult at all Somewhat difficult - Very difficult     Social Functioning Social maturity:   Social judgement:    Stress Current stressors:   Familial stressors:   Sleep:    Appetite:   Coping ability:   Patient taking medications as prescribed:    Current medications:  Outpatient Encounter Medications as of 05/29/2018  Medication Sig  . amLODipine (NORVASC) 10 MG tablet TAKE ONE TABLET BY MOUTH EVERY DAY  . ARIPiprazole (ABILIFY) 2 MG tablet TAKE 1 TABLET(2 MG) BY MOUTH DAILY  . baclofen (LIORESAL) 10 MG tablet Take 0.5 tablets (5 mg total) by mouth 2 (two) times daily.  . cloNIDine (CATAPRES) 0.1 MG tablet TAKE 1 TABLET BY MOUTH EVERY 12 HOURS AS NEEDED FOR SYSTOLIC BLOOD PRESSURE OVER 443 OR DIASTOLIC BLOOD PRESSURE OVER 100  . cyclobenzaprine (FLEXERIL) 5 MG tablet Take 1 tablet (5 mg total) by mouth 3 (three) times daily as needed for muscle spasms.  Eduard Roux (AIMOVIG) 140 MG/ML SOAJ Inject 140 mg into the skin every 30 (thirty) days. (Patient not taking: Reported on 05/19/2018)  . FLUoxetine (PROZAC) 40 MG capsule Take 1 capsule (40 mg total) by mouth daily.  . fluticasone (FLONASE) 50 MCG/ACT nasal spray Place 2 sprays into both nostrils 2 (two) times daily.  Marland Kitchen gabapentin (NEURONTIN) 300 MG capsule 1 capsule in the morning and 3 in the evening  . hydrOXYzine (ATARAX/VISTARIL) 50 MG tablet Take 1 tablet (50 mg total) by mouth at bedtime as needed.  . rizatriptan (MAXALT) 10 MG tablet Take 1 tablet (10 mg total) by mouth 3 (three) times daily as needed for migraine.  . traZODone (DESYREL) 100  MG tablet Take 1 tablet (100 mg total) by mouth at bedtime.   No facility-administered encounter medications on file as of 05/29/2018.     Self-harm Behaviors Risk Assessment Self-harm risk factors:   Patient endorses recent thoughts of harming self:    [place] Suicide Severity Rating Scale: No flowsheet data found.  Danger to Others Risk Assessment Danger to others risk factors:   Patient endorses recent thoughts of harming others:    Dynamic Appraisal of Situational Aggression (DASA): No flowsheet data found.  Substance Use Assessment Patient recently  consumed alcohol:    Alcohol Use Disorder Identification Test (AUDIT): No flowsheet data found. Patient recently used drugs:    Opioid Risk Assessment:  Patient is concerned about dependence or abuse of substances:    ASAM Multidimensional Assessment Summary:  Dimension 1:    Dimension 1 Rating:    Dimension 2:    Dimension 2 Rating:    Dimension 3:    Dimension 3 Rating:    Dimension 4:    Dimension 4 Rating:    Dimension 5:    Dimension 5 Rating:    Dimension 6:    Dimension 6 Rating:   ASAM's Severity Rating Score:   ASAM Recommended Level of Treatment:     Goals, Interventions and Follow-up Plan Goals: Increase healthy adjustment to current life circumstances and Increase adequate support systems for patient/family Interventions: Brief CBT Follow-up Plan: Refer to Psychiatrist for Medication Management  Summary of Clinical Assessment Summary: Carrie Burke is a 28 year old, married who presents for services to assist with reducing anger and anxiety.  Carrie Burke has been married for 10.5 years.  She reports poor communication in the relationship. Patient report that she has Neurologist appointments every few months due to a "lipoma on my head." MRI to determine course of treatment in a few days.  She reports that her patience is low and was diagnosed with Bipolar, Intermittent Explosive DIsorder, Anxiety and Depression in the past. She reports symptoms as: clammy hands, nervousness, warm sensation, restlessness and feeling keyed up.  Reports cycling appetite and sleep hygiene.  She reports that she gets overwhelmed at work and occasionally has to isolate herself to calm down.  Reports feelings "unlike herself" several times per day, everyday.  Reports that the feeling last at least 15 minutes but no more than 30 minutes. She reports "snapping" at her 3 children (ages 19, 33, 34) most of the day. Reports that while in high school she had the same symptoms just not as severe. Reports taking  Seroquel in the past but discontinued due to bad, vivid dreams.  Currently taking Abilify 2mg , Prozac and hydroxyzine.   Lubertha South, LCSW

## 2018-06-02 ENCOUNTER — Other Ambulatory Visit: Payer: BLUE CROSS/BLUE SHIELD

## 2018-06-02 NOTE — Telephone Encounter (Signed)
Patient called in staying she just found out that she lost her insurance and she r/s for 07/08/18 at [facility].

## 2018-06-10 ENCOUNTER — Other Ambulatory Visit: Payer: Self-pay

## 2018-06-10 ENCOUNTER — Encounter: Payer: Self-pay | Admitting: Psychiatry

## 2018-06-10 ENCOUNTER — Ambulatory Visit (INDEPENDENT_AMBULATORY_CARE_PROVIDER_SITE_OTHER): Payer: BLUE CROSS/BLUE SHIELD | Admitting: Psychiatry

## 2018-06-10 DIAGNOSIS — F17200 Nicotine dependence, unspecified, uncomplicated: Secondary | ICD-10-CM | POA: Diagnosis not present

## 2018-06-10 DIAGNOSIS — F3162 Bipolar disorder, current episode mixed, moderate: Secondary | ICD-10-CM

## 2018-06-10 DIAGNOSIS — F411 Generalized anxiety disorder: Secondary | ICD-10-CM | POA: Diagnosis not present

## 2018-06-10 MED ORDER — HYDROXYZINE PAMOATE 25 MG PO CAPS: 25.0000 mg | ORAL_CAPSULE | Freq: Two times a day (BID) | ORAL | 1 refills | Status: DC | PRN

## 2018-06-10 MED ORDER — ZIPRASIDONE HCL 40 MG PO CAPS: 40.0000 mg | ORAL_CAPSULE | Freq: Every day | ORAL | 0 refills | Status: DC

## 2018-06-10 NOTE — Progress Notes (Signed)
Virtual Visit via Video Note  I connected with Carrie Burke on 06/10/18 at  1:30 PM EDT by a video enabled telemedicine application and verified that I am speaking with the correct person using two identifiers.   I discussed the limitations of evaluation and management by telemedicine and the availability of in person appointments. The patient expressed understanding and agreed to proceed.    I discussed the assessment and treatment plan with the patient. The patient was provided an opportunity to ask questions and all were answered. The patient agreed with the plan and demonstrated an understanding of the instructions.   The patient was advised to call back or seek an in-person evaluation if the symptoms worsen or if the condition fails to improve as anticipated.    Psychiatric Initial Adult Assessment   Patient Identification: Carrie Burke MRN:  [id number] Date of Evaluation:  06/10/2018 Referral Source: [name] PA  Chief Complaint:   Chief Complaint    Establish Care; Anxiety; Agitation; Depression     Visit Diagnosis:    ICD-10-CM   1. Bipolar 1 disorder, mixed, moderate (HCC) F31.62 ziprasidone (GEODON) 40 MG capsule    hydrOXYzine (VISTARIL) 25 MG capsule  2. GAD (generalized anxiety disorder) F41.1 hydrOXYzine (VISTARIL) 25 MG capsule  3. Tobacco use disorder F17.200     History of Present Illness:  Carrie Burke is a 28 year old Caucasian female, married, employed, lives in [place] , has a history of bipolar disorder, chronic migraine, essential hypertension, history of lipoma was evaluated by telemedicine today.  Patient was under the treatment of her psychiatrist previously.  She reports she does not remember the name.  She reports she struggles with episodes of having high energy, being hyperactive, taking risk, being hypersexual, spending a lot of money, inability to sleep at night and not missing it and also episodes of sadness, crying spells,  irritability, anhedonia and sleep problems.  This can happen together or can happen separate.  She reports she was on a medication called Zyprexa previously.  She started gaining a lot of weight and hence stopped taking it few months ago.  She was started on Abilify by her primary medical doctor.  She reports she does not think the Abilify is helpful.  She may be more anxious on the Abilify.  She does call herself a Research officer, trade [employer].  She reports she worries about everything to the extreme.  She is often nervous and anxious and fidgety.  She has difficulty sitting still and has a lot of racing thoughts.  This has been going on for the past several months and getting worse.  She is currently on Prozac.  She does not think Prozac is helpful.  She also takes hydroxyzine which may be helpful to some extent.  Patient reports she has been having some sleep problems recently.  She reports sleep is restless and having vivid dreams since the past few weeks.  Patient reports she does not have a history of trauma.  Patient reports history of auditory hallucinations.  She reports she hears voices and however is unable to elaborate on them.  She reports it happens on and off.  Patient denies any suicidality or homicidality.  Patient reports she smokes cigarettes and is not ready to quit.  Patient reports good support system from her husband.  Reports work is going well.   Associated Signs/Symptoms: Depression Symptoms:  depressed mood, insomnia, psychomotor agitation, psychomotor retardation, feelings of worthlessness/guilt, difficulty concentrating, anxiety, disturbed sleep, (Hypo) Manic Symptoms:  Distractibility,  Elevated Mood, Flight of Ideas, Hallucinations, Impulsivity, Irritable Mood, Labiality of Mood, Anxiety Symptoms:  Excessive Worry, Psychotic Symptoms:  Hallucinations: Auditory PTSD Symptoms: Negative  Past Psychiatric History: Patient denies inpatient mental health admissions.   Patient reports a previous diagnosis of bipolar disorder.  Patient denies history of suicide attempts.  Patient reports being under the care of a psychiatrist previously however does not remember the name.  Her medications were most recently prescribed by her primary provider.  She recently started seeing a therapist Ms. Peacock here in our clinic.  Previous Psychotropic Medications: Yes zyprexa -weight gain, Zoloft, Paxil, Abilify, Prozac  Substance Abuse History in the last 12 months:  No.  Consequences of Substance Abuse: Negative  Past Medical History:  Past Medical History:  Diagnosis Date  . Anxiety   . Bipolar disorder ([place])   . Common migraine with intractable migraine 03/18/2017  . Depression   . Elevated serum glutamic pyruvic transaminase (SGPT) level   . Gait abnormality 03/18/2017  . Gestational diabetes   . High-risk pregnancy 01/24/2014  . History of pre-eclampsia   . History of pre-eclampsia in prior pregnancy, currently pregnant in third trimester 01/24/2014  . Polyhydramnios in third trimester 02/11/2014  . Preeclampsia   . Vitamin D deficiency disease     Past Surgical History:  Procedure Laterality Date  . CLAVICLE SURGERY  2014  . TUBAL LIGATION      Family Psychiatric History: Mother-depression and anxiety, paternal uncle-bipolar disorder  Family History:  Family History  Problem Relation Age of Onset  . Arthritis Mother   . Breast cancer Mother 109  . Depression Mother   . Anxiety disorder Mother   . Heart disease Father   . Hyperlipidemia Father   . Hypertension Father   . Asthma Brother   . Breast cancer Maternal Grandmother   . Breast cancer Paternal Grandmother   . Bipolar disorder Paternal Uncle     Social History:   Social History   Socioeconomic History  . Marital status: Married    Spouse name: jame  . Number of children: 3  . Years of education: Not on file  . Highest education level: High school graduate  Occupational History  . Not  on file  Social Needs  . Financial resource strain: Not hard at all  . Food insecurity:    Worry: Never true    Inability: Never true  . Transportation needs:    Medical: No    Non-medical: No  Tobacco Use  . Smoking status: Current Every Day Smoker    Packs/day: 1.00    Types: Cigarettes  . Smokeless tobacco: Never Used  Substance and Sexual Activity  . Alcohol use: Yes    Comment: Occasionally  . Drug use: No  . Sexual activity: Yes  Lifestyle  . Physical activity:    Days per week: 0 days    Minutes per session: 0 min  . Stress: Very much  Relationships  . Social connections:    Talks on phone: Not on file    Gets together: Not on file    Attends religious service: Never    Active member of club or organization: No    Attends meetings of clubs or organizations: Never    Relationship status: Married  Other Topics Concern  . Not on file  Social History Narrative   Lives with husband and kids   Caffeine use: coffee daily   Right handed     Additional Social History: Patient is married.  She reports she has 3 children between the age of 30-10.  She works at [employer].  She lives in [place].  She had a good childhood.  She graduated high school.  Allergies:  No Known Allergies  Metabolic Disorder Labs: Lab Results  Component Value Date   HGBA1C 5.9 (H) 03/02/2018   No results found for: PROLACTIN Lab Results  Component Value Date   CHOL 204 (H) 02/27/2015   TRIG 283 (H) 02/27/2015   HDL 45 02/27/2015   LDLCALC 102 (H) 02/27/2015   Lab Results  Component Value Date   TSH 2.570 09/04/2017    Therapeutic Level Labs: No results found for: LITHIUM No results found for: CBMZ No results found for: VALPROATE  Current Medications: Current Outpatient Medications  Medication Sig Dispense Refill  . amLODipine (NORVASC) 10 MG tablet TAKE ONE TABLET BY MOUTH EVERY DAY 30 tablet 0  . baclofen (LIORESAL) 10 MG tablet Take 0.5 tablets (5 mg total) by mouth 2 (two)  times daily. 90 each 1  . cloNIDine (CATAPRES) 0.1 MG tablet TAKE 1 TABLET BY MOUTH EVERY 12 HOURS AS NEEDED FOR SYSTOLIC BLOOD PRESSURE OVER 643 OR DIASTOLIC BLOOD PRESSURE OVER 100 180 tablet 0  . FLUoxetine (PROZAC) 40 MG capsule Take 1 capsule (40 mg total) by mouth daily. 90 capsule 1  . fluticasone (FLONASE) 50 MCG/ACT nasal spray Place 2 sprays into both nostrils 2 (two) times daily. 16 g 6  . gabapentin (NEURONTIN) 300 MG capsule 1 capsule in the morning and 3 in the evening 120 capsule 3  . rizatriptan (MAXALT) 10 MG tablet Take 1 tablet (10 mg total) by mouth 3 (three) times daily as needed for migraine. 10 tablet 3  . Erenumab-aooe (AIMOVIG) 140 MG/ML SOAJ Inject 140 mg into the skin every 30 (thirty) days. (Patient not taking: Reported on 05/19/2018) 1 pen 3  . hydrOXYzine (VISTARIL) 25 MG capsule Take 1 capsule (25 mg total) by mouth 2 (two) times daily as needed. Only for severe anxiety attacks 60 capsule 1  . ziprasidone (GEODON) 40 MG capsule Take 1 capsule (40 mg total) by mouth daily with supper. 30 capsule 0   No current facility-administered medications for this visit.     Musculoskeletal: Strength & Muscle Tone: within normal limits Gait & Station: normal Patient leans: N/A  Psychiatric Specialty Exam: Review of Systems  Psychiatric/Behavioral: Positive for depression and hallucinations. The patient is nervous/anxious.   All other systems reviewed and are negative.   Last menstrual period 05/19/2018.There is no height or weight on file to calculate BMI.  General Appearance: Casual  Eye Contact:  Fair  Speech:  Clear and Coherent  Volume:  Normal  Mood:  Anxious and Depressed  Affect:  Appropriate  Thought Process:  Goal Directed and Descriptions of Associations: Intact  Orientation:  Full (Time, Place, and Person)  Thought Content:  Hallucinations: Auditory unable to elaborate reports that it may be normal conversations  Suicidal Thoughts:  No  Homicidal Thoughts:   No  Memory:  Immediate;   Fair Recent;   Fair Remote;   Fair  Judgement:  Fair  Insight:  Fair  Psychomotor Activity:  Normal  Concentration:  Concentration: Fair and Attention Span: Fair  Recall:  [employer] of Knowledge:Fair  Language: Fair  Akathisia:  No  Handed:  Right  AIMS (if indicated):  Denies tremors, rigidity  Assets:  Communication Skills Desire for Improvement Housing Social Support  ADL's:  Intact  Cognition: WNL  Sleep:  Restless has vivid dreams   Screenings: GAD-7     Office Visit from 05/08/2018 in [facility] Visit from 04/03/2018 in [facility] Office Visit from 03/02/2018 in [facility] Visit from 08/23/2016 in [facility] Office Visit from 04/07/2015 in [facility]  Total GAD-7 Score  5  4  13  17  18     PHQ2-9     Office Visit from 05/08/2018 in [facility] Visit from 04/03/2018 in [facility] Visit from 03/02/2018 in [facility] Visit from 11/28/2017 in [facility] Visit from 09/25/2016 in [facility]  PHQ-2 Total Score  2  0  4  1  5   PHQ-9 Total Score  9  5  10  4  19       Assessment and Plan: Josy is a 28 year old Caucasian female, employed, lives in [place], married, has a history of migraine headaches, hypertension, lipoma, bipolar disorder, was evaluated by telemedicine today.  Patient is biologically predisposed given her family history of mental health problems as well as her own medical issues.  Patient has psychosocial stressors of current COVID-19 outbreak.  Patient will benefit from medication readjustment as well as psychotherapy session since she continues to struggle with mood lability.  Plan Bipolar disorder-unstable Discontinue Abilify for lack of benefit. Start Geodon 40 mg p.o. daily with supper Continue Prozac for now.  For GAD-unstable Prozac as prescribed. She  will continue CBT with Ms. Peacock Change hydroxyzine to 25 mg p.o. twice daily PRN for severe anxiety symptoms Also on gabapentin which she takes for neurological pain.  For tobacco use disorder-unstable Provided smoking cessation counseling.  Reviewed EKG-dated 09/04/2017-QTC within normal limits.  TSH-reviewed dated 09/04/2017-2.57, hemoglobin A1c-5.9 slightly elevated dated 03/02/2018.  Discussed with patient about getting lipid panel, prolactin.  She will talk to her primary medical doctor.  Follow-up in clinic in 10 days or sooner if needed.  June 9 at 9:15 AM.  I have spent atleast 40 minutes non face to face with patient today. More than 50 % of the time was spent for psychoeducation and supportive psychotherapy and care coordination. This note was generated in part or whole with voice recognition software. Voice recognition is usually quite accurate but there are transcription errors that can and very often do occur. I apologize for any typographical errors that were not detected and corrected.       Ursula Alert, MD 5/27/20205:46 PM

## 2018-06-11 ENCOUNTER — Encounter: Payer: BLUE CROSS/BLUE SHIELD | Admitting: Family Medicine

## 2018-06-19 ENCOUNTER — Ambulatory Visit: Payer: Self-pay | Admitting: Licensed Clinical Social Worker

## 2018-06-19 ENCOUNTER — Other Ambulatory Visit: Payer: Self-pay

## 2018-06-22 ENCOUNTER — Ambulatory Visit: Payer: Self-pay | Admitting: Licensed Clinical Social Worker

## 2018-06-22 ENCOUNTER — Other Ambulatory Visit: Payer: Self-pay

## 2018-06-23 ENCOUNTER — Encounter: Payer: Self-pay | Admitting: Psychiatry

## 2018-06-23 ENCOUNTER — Other Ambulatory Visit: Payer: Self-pay | Admitting: Family Medicine

## 2018-06-23 ENCOUNTER — Ambulatory Visit (INDEPENDENT_AMBULATORY_CARE_PROVIDER_SITE_OTHER): Payer: Self-pay | Admitting: Psychiatry

## 2018-06-23 DIAGNOSIS — F17200 Nicotine dependence, unspecified, uncomplicated: Secondary | ICD-10-CM

## 2018-06-23 DIAGNOSIS — F3162 Bipolar disorder, current episode mixed, moderate: Secondary | ICD-10-CM

## 2018-06-23 DIAGNOSIS — F1010 Alcohol abuse, uncomplicated: Secondary | ICD-10-CM

## 2018-06-23 DIAGNOSIS — F411 Generalized anxiety disorder: Secondary | ICD-10-CM

## 2018-06-23 NOTE — Progress Notes (Signed)
Virtual Visit via Video Note  I connected with Carrie Burke on 06/23/18 at  9:15 AM EDT by a video enabled telemedicine application and verified that I am speaking with the correct person using two identifiers.   I discussed the limitations of evaluation and management by telemedicine and the availability of in person appointments. The patient expressed understanding and agreed to proceed.    I discussed the assessment and treatment plan with the patient. The patient was provided an opportunity to ask questions and all were answered. The patient agreed with the plan and demonstrated an understanding of the instructions.   The patient was advised to call back or seek an in-person evaluation if the symptoms worsen or if the condition fails to improve as anticipated.   [facility] MD OP Progress Note  06/23/2018 10:43 AM Carrie Burke  MRN:  [id number]  Chief Complaint:  Chief Complaint    Follow-up     HPI: Carrie Burke is a 28 year old Caucasian female, married, employed, lives in [place], has a history of bipolar disorder, chronic migraine, generalized anxiety disorder, hypertension, history of lipoma was evaluated by telemedicine today.  Patient today reports that she has not started the [place] yet.  She reports she currently continues to be on Abilify.  She reports she was hoping to get established with her husband's health insurance plan before she starts the new medication.  She reports she is hoping that she will get the new health insurance plan in the next 3 to 4 days.  She is hoping to start Geodon by that time.  Patient today reports she however currently struggles with mood lability, irritability, anxiety and agitation.  She reports she often feels on edge.  She has been self-medicating herself with alcohol.  She reports she has been binging on alcohol on weekends.  She reports last weekend she drank at least 6 beers and threw up.  She reports when she takes the alcohol she  feels less anxious and that is the reason she has been doing it.  Some time was spent providing counseling about alcohol use.  Discussed with her the adverse side effects of using alcohol as well as the interaction with her medications.  Also discussed with her to talk to her therapist about this.  She has upcoming appointment with her therapist.  Since she continues to feel on edge during the day discussed with her to increase the Abilify dosage to 4 mg daily for the next 3 to 4 days until she can start her Geodon.  Also discussed with her to continue hydroxyzine as needed in the meantime.  Patient denies any suicidality, homicidality or perceptual disturbances.  Patient denies any other concerns today. Visit Diagnosis:    ICD-10-CM   1. Bipolar 1 disorder, mixed, moderate (HCC) F31.62   2. GAD (generalized anxiety disorder) F41.1   3. Alcohol abuse F10.10    binging  4. Tobacco use disorder F17.200     Past Psychiatric History: I have reviewed past psychiatric history from my progress note on 06/10/2018.  Past Medical History:  Past Medical History:  Diagnosis Date  . Anxiety   . Bipolar disorder ([place])   . Common migraine with intractable migraine 03/18/2017  . Depression   . Elevated serum glutamic pyruvic transaminase (SGPT) level   . Gait abnormality 03/18/2017  . Gestational diabetes   . High-risk pregnancy 01/24/2014  . History of pre-eclampsia   . History of pre-eclampsia in prior pregnancy, currently pregnant in third trimester  01/24/2014  . Polyhydramnios in third trimester 02/11/2014  . Preeclampsia   . Vitamin D deficiency disease     Past Surgical History:  Procedure Laterality Date  . CLAVICLE SURGERY  2014  . TUBAL LIGATION      Family Psychiatric History: I have reviewed family psychiatric history from my progress note on 06/10/2018.  Family History:  Family History  Problem Relation Age of Onset  . Arthritis Mother   . Breast cancer Mother 28  . Depression  Mother   . Anxiety disorder Mother   . Heart disease Father   . Hyperlipidemia Father   . Hypertension Father   . Asthma Brother   . Breast cancer Maternal Grandmother   . Breast cancer Paternal Grandmother   . Bipolar disorder Paternal Uncle     Social History: Reviewed social history from my progress note on 06/10/2018. Social History   Socioeconomic History  . Marital status: Married    Spouse name: jame  . Number of children: 3  . Years of education: Not on file  . Highest education level: High school graduate  Occupational History  . Not on file  Social Needs  . Financial resource strain: Not hard at all  . Food insecurity:    Worry: Never true    Inability: Never true  . Transportation needs:    Medical: No    Non-medical: No  Tobacco Use  . Smoking status: Current Every Day Smoker    Packs/day: 1.00    Types: Cigarettes  . Smokeless tobacco: Never Used  Substance and Sexual Activity  . Alcohol use: Yes    Comment: Occasionally  . Drug use: No  . Sexual activity: Yes  Lifestyle  . Physical activity:    Days per week: 0 days    Minutes per session: 0 min  . Stress: Very much  Relationships  . Social connections:    Talks on phone: Not on file    Gets together: Not on file    Attends religious service: Never    Active member of club or organization: No    Attends meetings of clubs or organizations: Never    Relationship status: Married  Other Topics Concern  . Not on file  Social History Narrative   Lives with husband and kids   Caffeine use: coffee daily   Right handed     Allergies: No Known Allergies  Metabolic Disorder Labs: Lab Results  Component Value Date   HGBA1C 5.9 (H) 03/02/2018   No results found for: PROLACTIN Lab Results  Component Value Date   CHOL 204 (H) 02/27/2015   TRIG 283 (H) 02/27/2015   HDL 45 02/27/2015   LDLCALC 102 (H) 02/27/2015   Lab Results  Component Value Date   TSH 2.570 09/04/2017   TSH 1.570 02/27/2015     Therapeutic Level Labs: No results found for: LITHIUM No results found for: VALPROATE No components found for:  CBMZ  Current Medications: Current Outpatient Medications  Medication Sig Dispense Refill  . amLODipine (NORVASC) 10 MG tablet TAKE 1 TABLET BY MOUTH EVERY DAY 30 tablet 0  . baclofen (LIORESAL) 10 MG tablet Take 0.5 tablets (5 mg total) by mouth 2 (two) times daily. 90 each 1  . cloNIDine (CATAPRES) 0.1 MG tablet TAKE 1 TABLET BY MOUTH EVERY 12 HOURS AS NEEDED FOR SYSTOLIC BLOOD PRESSURE OVER 294 OR DIASTOLIC BLOOD PRESSURE OVER 100 180 tablet 0  . Erenumab-aooe (AIMOVIG) 140 MG/ML SOAJ Inject 140 mg into the skin  every 30 (thirty) days. (Patient not taking: Reported on 05/19/2018) 1 pen 3  . FLUoxetine (PROZAC) 40 MG capsule Take 1 capsule (40 mg total) by mouth daily. 90 capsule 1  . fluticasone (FLONASE) 50 MCG/ACT nasal spray Place 2 sprays into both nostrils 2 (two) times daily. 16 g 6  . gabapentin (NEURONTIN) 300 MG capsule 1 capsule in the morning and 3 in the evening 120 capsule 3  . hydrOXYzine (ATARAX/VISTARIL) 25 MG tablet     . hydrOXYzine (VISTARIL) 25 MG capsule Take 1 capsule (25 mg total) by mouth 2 (two) times daily as needed. Only for severe anxiety attacks 60 capsule 1  . rizatriptan (MAXALT) 10 MG tablet Take 1 tablet (10 mg total) by mouth 3 (three) times daily as needed for migraine. 10 tablet 3  . ziprasidone (GEODON) 40 MG capsule Take 1 capsule (40 mg total) by mouth daily with supper. 30 capsule 0   No current facility-administered medications for this visit.      Musculoskeletal: Strength & Muscle Tone: within normal limits Gait & Station: normal Patient leans: N/A  Psychiatric Specialty Exam: Review of Systems  Psychiatric/Behavioral: The patient is nervous/anxious and has insomnia.   All other systems reviewed and are negative.   There were no vitals taken for this visit.There is no height or weight on file to calculate BMI.  General  Appearance: Casual  Eye Contact:  Fair  Speech:  Clear and Coherent  Volume:  Normal  Mood:  Anxious  Affect:  Congruent  Thought Process:  Goal Directed and Descriptions of Associations: Intact  Orientation:  Full (Time, Place, and Person)  Thought Content: Logical   Suicidal Thoughts:  No  Homicidal Thoughts:  No  Memory:  Immediate;   Fair Recent;   Fair Remote;   Fair  Judgement:  Fair  Insight:  Fair  Psychomotor Activity:  Normal  Concentration:  Concentration: Fair and Attention Span: Fair  Recall:  [employer] of Knowledge: Fair  Language: Fair  Akathisia:  No  Handed:  Right  AIMS (if indicated): denies tremors, rigidity  Assets:  Communication Skills Desire for Improvement Housing Social Support  ADL's:  Intact  Cognition: WNL  Sleep:  restless   Screenings: GAD-7     Office Visit from 05/08/2018 in [facility] Visit from 04/03/2018 in [facility] Visit from 03/02/2018 in [facility] Visit from 08/23/2016 in [facility] Visit from 04/07/2015 in [facility]  Total GAD-7 Score  5  4  13  17  18     PHQ2-9     Office Visit from 05/08/2018 in [facility] Visit from 04/03/2018 in [facility] Visit from 03/02/2018 in [facility] Visit from 11/28/2017 in [facility] Visit from 09/25/2016 in [facility]  PHQ-2 Total Score  2  0  4  1  5   PHQ-9 Total Score  9  5  10  4  19        Assessment and Plan: Carrie Burke is a 28 year old Caucasian female, employed, lives in [place], married, has a history of migraine headaches, hypertension, lipoma, bipolar disorder was evaluated by telemedicine today.  Patient is biologically predisposed given her family history of mental health problems as well as her own medical issues.  Patient has psychosocial stressors of current COVID-19 outbreak.  Patient will benefit  from medication readjustment as well as psychotherapy sessions since she continues  to struggle with mood lability.  Plan as noted below.  Plan Bipolar disorder-unstable Patient has not started her Geodon yet.  She reports she will be able to get the Geodon in the next few days.  Start Geodon at 40 mg p.o. daily with supper. She continues to be on Abilify, her previous medication.  Discussed with patient to continue Abilify and possibly increase it to 4 mg if she is too agitated until she can start Geodon.  She is waiting to get on her health insurance plan. Continue Prozac 40 milligrams p.o. daily.  For GAD-unstable Prozac as prescribed She will continue CBT with Ms. Peacock on a more frequent basis. Continue hydroxyzine 25 mg p.o. twice daily PRN for severe anxiety symptoms She is also on gabapentin which he takes for neurological pain  For tobacco use disorder-unstable Provided smoking cessation counseling  For alcohol abuse-episodic-unstable Provided alcohol abuse counseling.  Discussed with patient to cut back and also discussed the effect of alcohol on her sleep and also interaction with her medications. Patient will continue to work with her therapist on the same.   Follow-up in clinic in 3 to 4 weeks or sooner if needed.  Appointment scheduled for July 6 at 10:45 AM.  I have spent atleast 15 minutes non face to face with patient today. More than 50 % of the time was spent for psychoeducation and supportive psychotherapy and care coordination.  This note was generated in part or whole with voice recognition software. Voice recognition is usually quite accurate but there are transcription errors that can and very often do occur. I apologize for any typographical errors that were not detected and corrected.        Ursula Alert, MD 06/23/2018, 10:43 AM

## 2018-06-23 NOTE — Telephone Encounter (Signed)
Please advise on refill request, do not see on med list

## 2018-06-24 ENCOUNTER — Ambulatory Visit: Payer: Managed Care, Other (non HMO) | Admitting: Neurology

## 2018-07-01 ENCOUNTER — Encounter

## 2018-07-01 ENCOUNTER — Other Ambulatory Visit: Payer: Self-pay

## 2018-07-01 ENCOUNTER — Ambulatory Visit: Payer: BLUE CROSS/BLUE SHIELD | Admitting: Psychiatry

## 2018-07-01 ENCOUNTER — Ambulatory Visit: Payer: Self-pay | Admitting: Licensed Clinical Social Worker

## 2018-07-05 ENCOUNTER — Other Ambulatory Visit: Payer: Self-pay | Admitting: Neurology

## 2018-07-08 ENCOUNTER — Other Ambulatory Visit: Payer: Self-pay

## 2018-07-08 ENCOUNTER — Ambulatory Visit: Payer: Self-pay

## 2018-07-08 DIAGNOSIS — R519 Headache, unspecified: Secondary | ICD-10-CM

## 2018-07-08 DIAGNOSIS — R51 Headache: Secondary | ICD-10-CM

## 2018-07-08 MED ORDER — GADOBENATE DIMEGLUMINE 529 MG/ML IV SOLN
20.0000 mL | Freq: Once | INTRAVENOUS | Status: AC | PRN
  Administered 2018-07-08: 20 mL via INTRAVENOUS

## 2018-07-10 ENCOUNTER — Encounter: Payer: Self-pay | Admitting: Family Medicine

## 2018-07-13 ENCOUNTER — Ambulatory Visit: Payer: Self-pay | Admitting: Licensed Clinical Social Worker

## 2018-07-13 ENCOUNTER — Telehealth: Payer: Self-pay | Admitting: Neurology

## 2018-07-13 NOTE — Telephone Encounter (Signed)
Spoke to pt and relayed that her MRI lipoma stable.  No acute findings.  Will monitor every 2-3 yrs or sooner as needed.  She verbalized understanding.

## 2018-07-13 NOTE — Telephone Encounter (Signed)
Please call the patient.  Repeat MRI of the brain shows a stable left cerebellopontine angle lipoma (6 x 5 mm on axial views).  MRI of the brain was ordered to follow-up on a left CP angle lipoma seeming to encase the 7th and 8th cranial nerves done in March 2019. No evidence of mass effect or midline shift.   MRI of the brain 07/08/2018 IMPRESSION:   MRI brain (with and without) demonstrating: - Stable left cerebellopontine angle lipoma. - No acute findings.  We will continue to monitor this every 2-3 years or if symptoms warrant.

## 2018-07-20 ENCOUNTER — Other Ambulatory Visit: Payer: Self-pay

## 2018-07-20 ENCOUNTER — Ambulatory Visit (INDEPENDENT_AMBULATORY_CARE_PROVIDER_SITE_OTHER): Payer: Self-pay | Admitting: Licensed Clinical Social Worker

## 2018-07-20 ENCOUNTER — Ambulatory Visit (INDEPENDENT_AMBULATORY_CARE_PROVIDER_SITE_OTHER): Payer: Self-pay | Admitting: Psychiatry

## 2018-07-20 ENCOUNTER — Encounter: Payer: Self-pay | Admitting: Psychiatry

## 2018-07-20 DIAGNOSIS — F17200 Nicotine dependence, unspecified, uncomplicated: Secondary | ICD-10-CM

## 2018-07-20 DIAGNOSIS — F3162 Bipolar disorder, current episode mixed, moderate: Secondary | ICD-10-CM

## 2018-07-20 DIAGNOSIS — F1010 Alcohol abuse, uncomplicated: Secondary | ICD-10-CM

## 2018-07-20 DIAGNOSIS — F411 Generalized anxiety disorder: Secondary | ICD-10-CM

## 2018-07-20 MED ORDER — ZIPRASIDONE HCL 40 MG PO CAPS: 40.0000 mg | ORAL_CAPSULE | Freq: Every day | ORAL | 0 refills | Status: DC

## 2018-07-20 NOTE — Progress Notes (Signed)
Virtual Visit via Video Note  I connected with Carrie Burke on 07/20/18 at 10:45 AM EDT by a video enabled telemedicine application and verified that I am speaking with the correct person using two identifiers.   I discussed the limitations of evaluation and management by telemedicine and the availability of in person appointments. The patient expressed understanding and agreed to proceed.   I discussed the assessment and treatment plan with the patient. The patient was provided an opportunity to ask questions and all were answered. The patient agreed with the plan and demonstrated an understanding of the instructions.   The patient was advised to call back or seek an in-person evaluation if the symptoms worsen or if the condition fails to improve as anticipated.   [facility] MD OP Progress Note  07/20/2018 6:34 PM Carrie Burke  MRN:  [id number]  Chief Complaint:  Chief Complaint    Follow-up     HPI: Carrie Burke is a 28 year old Caucasian female, married, employed, lives in [place], has a history of bipolar disorder, GAD, chronic migraine, hypertension, history of lipoma, alcohol abuse, tobacco use disorder was evaluated by telemedicine today.   Patient today reports that she has started taking Geodon.  She is currently on 40 mg.  She has been taking it with her meals.  She reports she has started noticing some benefit from the [place].  She reports her mood swings is better.  She reports sleep is improving.  She reports work is going well.  She reports her ability to function at work has improved since being on the medications.  She reports she has been able to stay away from alcohol since the past 1 week or more.  She continues to stay sober.  Patient denies any suicidality, homicidality or perceptual disturbances. Visit Diagnosis:    ICD-10-CM   1. Bipolar 1 disorder, mixed, moderate (HCC)  F31.62 ziprasidone (GEODON) 40 MG capsule  2. GAD (generalized anxiety disorder)   F41.1   3. Alcohol abuse  F10.10    improving - in early remission  4. Tobacco use disorder  F17.200     Past Psychiatric History: I have reviewed past psychiatric history from my progress note on 06/10/2018.  Past Medical History:  Past Medical History:  Diagnosis Date  . Anxiety   . Bipolar disorder ([place])   . Common migraine with intractable migraine 03/18/2017  . Depression   . Elevated serum glutamic pyruvic transaminase (SGPT) level   . Gait abnormality 03/18/2017  . Gestational diabetes   . High-risk pregnancy 01/24/2014  . History of pre-eclampsia   . History of pre-eclampsia in prior pregnancy, currently pregnant in third trimester 01/24/2014  . Polyhydramnios in third trimester 02/11/2014  . Preeclampsia   . Vitamin D deficiency disease     Past Surgical History:  Procedure Laterality Date  . CLAVICLE SURGERY  2014  . TUBAL LIGATION      Family Psychiatric History: I have reviewed family psychiatric history from my progress note on 06/10/2018.  Family History:  Family History  Problem Relation Age of Onset  . Arthritis Mother   . Breast cancer Mother 87  . Depression Mother   . Anxiety disorder Mother   . Heart disease Father   . Hyperlipidemia Father   . Hypertension Father   . Asthma Brother   . Breast cancer Maternal Grandmother   . Breast cancer Paternal Grandmother   . Bipolar disorder Paternal Uncle     Social History: I have reviewed social  history from my progress note on 06/10/2018. Social History   Socioeconomic History  . Marital status: Married    Spouse name: jame  . Number of children: 3  . Years of education: Not on file  . Highest education level: High school graduate  Occupational History  . Not on file  Social Needs  . Financial resource strain: Not hard at all  . Food insecurity    Worry: Never true    Inability: Never true  . Transportation needs    Medical: No    Non-medical: No  Tobacco Use  . Smoking status: Current Every Day  Smoker    Packs/day: 1.00    Types: Cigarettes  . Smokeless tobacco: Never Used  Substance and Sexual Activity  . Alcohol use: Yes    Comment: Occasionally  . Drug use: No  . Sexual activity: Yes  Lifestyle  . Physical activity    Days per week: 0 days    Minutes per session: 0 min  . Stress: Very much  Relationships  . Social Herbalist on phone: Not on file    Gets together: Not on file    Attends religious service: Never    Active member of club or organization: No    Attends meetings of clubs or organizations: Never    Relationship status: Married  Other Topics Concern  . Not on file  Social History Narrative   Lives with husband and kids   Caffeine use: coffee daily   Right handed     Allergies: No Known Allergies  Metabolic Disorder Labs: Lab Results  Component Value Date   HGBA1C 5.9 (H) 03/02/2018   No results found for: PROLACTIN Lab Results  Component Value Date   CHOL 204 (H) 02/27/2015   TRIG 283 (H) 02/27/2015   HDL 45 02/27/2015   LDLCALC 102 (H) 02/27/2015   Lab Results  Component Value Date   TSH 2.570 09/04/2017   TSH 1.570 02/27/2015    Therapeutic Level Labs: No results found for: LITHIUM No results found for: VALPROATE No components found for:  CBMZ  Current Medications: Current Outpatient Medications  Medication Sig Dispense Refill  . amLODipine (NORVASC) 10 MG tablet TAKE 1 TABLET BY MOUTH EVERY DAY 30 tablet 0  . baclofen (LIORESAL) 10 MG tablet Take 0.5 tablets (5 mg total) by mouth 2 (two) times daily. 90 each 1  . cloNIDine (CATAPRES) 0.1 MG tablet TAKE 1 TABLET BY MOUTH EVERY 12 HOURS AS NEEDED FOR SYSTOLIC BLOOD PRESSURE OVER 093 OR DIASTOLIC BLOOD PRESSURE OVER 100 180 tablet 0  . Erenumab-aooe (AIMOVIG) 140 MG/ML SOAJ Inject 140 mg into the skin every 30 (thirty) days. (Patient not taking: Reported on 05/19/2018) 1 pen 3  . FLUoxetine (PROZAC) 40 MG capsule Take 1 capsule (40 mg total) by mouth daily. 90 capsule 1   . fluticasone (FLONASE) 50 MCG/ACT nasal spray Place 2 sprays into both nostrils 2 (two) times daily. 16 g 6  . gabapentin (NEURONTIN) 300 MG capsule TAKE ONE CAPSULE BY MOUTH EVERY MORNING AND 3 CAPSULES IN THE EVENING 120 capsule 3  . hydrOXYzine (ATARAX/VISTARIL) 25 MG tablet     . hydrOXYzine (VISTARIL) 25 MG capsule Take 1 capsule (25 mg total) by mouth 2 (two) times daily as needed. Only for severe anxiety attacks 60 capsule 1  . rizatriptan (MAXALT) 10 MG tablet Take 1 tablet (10 mg total) by mouth 3 (three) times daily as needed for migraine. 10 tablet 3  .  ziprasidone (GEODON) 40 MG capsule Take 1 capsule (40 mg total) by mouth daily with supper. 90 capsule 0   No current facility-administered medications for this visit.      Musculoskeletal: Strength & Muscle Tone: within normal limits Gait & Station: normal Patient leans: N/A  Psychiatric Specialty Exam: Review of Systems  Psychiatric/Behavioral: The patient is nervous/anxious.   All other systems reviewed and are negative.   There were no vitals taken for this visit.There is no height or weight on file to calculate BMI.  General Appearance: Casual  Eye Contact:  Good  Speech:  Clear and Coherent  Volume:  Normal  Mood:  Anxious  Affect:  Appropriate  Thought Process:  Goal Directed and Descriptions of Associations: Intact  Orientation:  Full (Time, Place, and Person)  Thought Content: Logical   Suicidal Thoughts:  No  Homicidal Thoughts:  No  Memory:  Immediate;   Fair Recent;   Fair Remote;   Fair  Judgement:  Fair  Insight:  Fair  Psychomotor Activity:  Normal  Concentration:  Concentration: Fair and Attention Span: Fair  Recall:  [employer] of Knowledge: Fair  Language: Fair  Akathisia:  No  Handed:  Right  AIMS (if indicated): denies tremors, rigidity  Assets:  Communication Skills Desire for Improvement Housing Social Support Talents/Skills  ADL's:  Intact  Cognition: WNL  Sleep:  Fair    Screenings: GAD-7     Office Visit from 05/08/2018 in [facility] Visit from 04/03/2018 in [facility] Visit from 03/02/2018 in [facility] Visit from 08/23/2016 in [facility] Visit from 04/07/2015 in [facility]  Total GAD-7 Score  5  4  13  17  18     PHQ2-9     Office Visit from 05/08/2018 in [facility] Visit from 04/03/2018 in [facility] Visit from 03/02/2018 in [facility] Visit from 11/28/2017 in [facility] Visit from 09/25/2016 in [facility]  PHQ-2 Total Score  2  0  4  1  5   PHQ-9 Total Score  9  5  10  4  19        Assessment and Plan: Ketura is a 28 year old Caucasian female, employed, lives in [place], married, has a history of migraine headaches, hypertension, lipoma, bipolar disorder was evaluated by telemedicine today.  She is biologically predisposed given her family history of mental health problems as well as her own medical issues.  She also has psychosocial stressors of current COVID-19 outbreak.  She is currently compliant with recommendations and is currently making progress.  Plan For bipolar disorder-improving Geodon 40 mg p.o. daily with supper. She has stopped taking the Abilify. Prozac 40 mg p.o. daily  For GAD-improving Prozac as prescribed She will continue CBT with Ms. Peacock Hydroxyzine 25 mg p.o. twice daily PRN for severe anxiety attacks She is also on gabapentin which she takes for neurological pain  For tobacco use disorder-unstable Provided smoking cessation counseling.  For alcohol abuse-episodic-in early remission She reports she has been sober since the past 1 week or more.  Follow-up in clinic in 1 month or sooner if needed.  August 28 at 10:15 AM  I have spent atleast 15 minutes non face to face with patient today. More than 50 % of the time was spent for  psychoeducation and supportive psychotherapy and care coordination.  This note was generated in part or whole with  voice recognition software. Voice recognition is usually quite accurate but there are transcription errors that can and very often do occur. I apologize for any typographical errors that were not detected and corrected.        Ursula Alert, MD 07/20/2018, 6:34 PM

## 2018-08-05 ENCOUNTER — Other Ambulatory Visit: Payer: Self-pay

## 2018-08-05 ENCOUNTER — Ambulatory Visit: Payer: Self-pay | Admitting: Licensed Clinical Social Worker

## 2018-08-19 ENCOUNTER — Ambulatory Visit: Payer: Self-pay | Admitting: Licensed Clinical Social Worker

## 2018-08-31 ENCOUNTER — Other Ambulatory Visit: Payer: Self-pay

## 2018-08-31 MED ORDER — BACLOFEN 10 MG PO TABS: 5.0000 mg | ORAL_TABLET | Freq: Two times a day (BID) | ORAL | 3 refills | Status: DC

## 2018-09-11 ENCOUNTER — Other Ambulatory Visit: Payer: Self-pay

## 2018-09-11 ENCOUNTER — Ambulatory Visit (INDEPENDENT_AMBULATORY_CARE_PROVIDER_SITE_OTHER): Payer: Self-pay | Admitting: Psychiatry

## 2018-09-11 DIAGNOSIS — Z5329 Procedure and treatment not carried out because of patient's decision for other reasons: Secondary | ICD-10-CM

## 2018-09-11 NOTE — Progress Notes (Signed)
No response to call 

## 2018-09-15 ENCOUNTER — Encounter: Payer: Self-pay | Admitting: Emergency Medicine

## 2018-09-15 ENCOUNTER — Emergency Department
Admission: EM | Admit: 2018-09-15 | Discharge: 2018-09-15 | Disposition: A | Discharge status: Home / Self Care | Payer: Self-pay | Attending: Emergency Medicine | Admitting: Emergency Medicine

## 2018-09-15 ENCOUNTER — Other Ambulatory Visit: Payer: Self-pay

## 2018-09-15 DIAGNOSIS — Z79899 Other long term (current) drug therapy: Secondary | ICD-10-CM | POA: Insufficient documentation

## 2018-09-15 DIAGNOSIS — F17210 Nicotine dependence, cigarettes, uncomplicated: Secondary | ICD-10-CM | POA: Insufficient documentation

## 2018-09-15 DIAGNOSIS — M25512 Pain in left shoulder: Secondary | ICD-10-CM | POA: Insufficient documentation

## 2018-09-15 DIAGNOSIS — M542 Cervicalgia: Secondary | ICD-10-CM | POA: Insufficient documentation

## 2018-09-15 LAB — POCT PREGNANCY, URINE: Preg Test, Ur: NEGATIVE

## 2018-09-15 MED ORDER — CYCLOBENZAPRINE HCL 5 MG PO TABS: 5.0000 mg | ORAL_TABLET | Freq: Three times a day (TID) | ORAL | 0 refills | Status: DC | PRN

## 2018-09-15 NOTE — ED Triage Notes (Signed)
Woke up sat am with left side neck and shoulder pain that is worsened with movements.

## 2018-09-15 NOTE — ED Notes (Signed)
See triage note  Presents with some pain to neck and into arm

## 2018-09-15 NOTE — ED Notes (Signed)
POC was negative 

## 2018-09-15 NOTE — ED Provider Notes (Signed)
[facility] Emergency Department Provider Note ____________________________________________  Time seen: 1114  I have reviewed the triage vital signs and the nursing notes.  HISTORY  Chief Complaint  Neck Pain and Shoulder Pain  HPI Carrie Burke is a 28 y.o. female presents herself to the ED with sudden onset of left-sided neck pain and shoulder pain that is worsened with movement.  She describes onset when she woke up Saturday morning.  She denies any preceding injury, trauma, or accident.  She is denied any chest pain, shortness of breath, fever, chills, sweats.  She does report some mild muscle pain in the shoulder referral into the upper arm.  No grip changes or distal paresthesias are reported.   Past Medical History:  Diagnosis Date  . Anxiety   . Bipolar disorder ([place])   . Common migraine with intractable migraine 03/18/2017  . Depression   . Elevated serum glutamic pyruvic transaminase (SGPT) level   . Gait abnormality 03/18/2017  . Gestational diabetes   . High-risk pregnancy 01/24/2014  . History of pre-eclampsia   . History of pre-eclampsia in prior pregnancy, currently pregnant in third trimester 01/24/2014  . Polyhydramnios in third trimester 02/11/2014  . Preeclampsia   . Vitamin D deficiency disease     Patient Active Problem List   Diagnosis Date Noted  . Bipolar 1 disorder, mixed, moderate ([place]) 07/20/2018  . GAD (generalized anxiety disorder) 07/20/2018  . Sleep apnea 05/13/2018  . Allergic rhinitis 04/23/2018  . Essential hypertension 09/07/2017  . Obesity (BMI 30.0-34.9) 03/26/2017  . Gait abnormality 03/18/2017  . Common migraine with intractable migraine 03/18/2017  . Insomnia 04/09/2015  . Tobacco use disorder 03/22/2015  . Pain in right shoulder 02/27/2015  . Vitamin D deficiency disease   . Bipolar disorder ([place])   . Gestational diabetes mellitus in third trimester 01/24/2014  . Clavicle fracture 03/10/2012    Past  Surgical History:  Procedure Laterality Date  . CLAVICLE SURGERY  2014  . TUBAL LIGATION      Prior to Admission medications   Medication Sig Start Date End Date Taking? Authorizing Provider  amLODipine (NORVASC) 10 MG tablet TAKE 1 TABLET BY MOUTH EVERY DAY 06/23/18   Volney American, PA-C  baclofen (LIORESAL) 10 MG tablet Take 0.5 tablets (5 mg total) by mouth 2 (two) times daily. 08/31/18   Suzzanne Cloud, NP  cloNIDine (CATAPRES) 0.1 MG tablet TAKE 1 TABLET BY MOUTH EVERY 12 HOURS AS NEEDED FOR SYSTOLIC BLOOD PRESSURE OVER 0000000 OR DIASTOLIC BLOOD PRESSURE OVER 100 12/15/17   Volney American, PA-C  cyclobenzaprine (FLEXERIL) 5 MG tablet Take 1 tablet (5 mg total) by mouth 3 (three) times daily as needed. 09/15/18   Cumi Sanagustin, Dannielle Karvonen, PA-C  Erenumab-aooe (AIMOVIG) 140 MG/ML SOAJ Inject 140 mg into the skin every 30 (thirty) days. Patient not taking: Reported on 05/19/2018 05/11/18   Suzzanne Cloud, NP  FLUoxetine (PROZAC) 40 MG capsule Take 1 capsule (40 mg total) by mouth daily. 04/03/18   Volney American, PA-C  fluticasone [facility]) 50 MCG/ACT nasal spray Place 2 sprays into both nostrils 2 (two) times daily. 10/17/17   Volney American, PA-C  gabapentin (NEURONTIN) 300 MG capsule TAKE ONE CAPSULE BY MOUTH EVERY MORNING AND 3 CAPSULES IN THE EVENING 07/06/18   Suzzanne Cloud, NP  hydrOXYzine (ATARAX/VISTARIL) 25 MG tablet  05/27/18   [provider]  hydrOXYzine (VISTARIL) 25 MG capsule Take 1 capsule (25 mg total) by mouth 2 (  two) times daily as needed. Only for severe anxiety attacks 06/10/18   Ursula Alert, MD  rizatriptan (MAXALT) 10 MG tablet Take 1 tablet (10 mg total) by mouth 3 (three) times daily as needed for migraine. 05/11/18   Suzzanne Cloud, NP  ziprasidone (GEODON) 40 MG capsule Take 1 capsule (40 mg total) by mouth daily with supper. 07/20/18   Ursula Alert, MD    Allergies Patient has no known allergies.  Family History  Problem Relation  Age of Onset  . Arthritis Mother   . Breast cancer Mother 42  . Depression Mother   . Anxiety disorder Mother   . Heart disease Father   . Hyperlipidemia Father   . Hypertension Father   . Asthma Brother   . Breast cancer Maternal Grandmother   . Breast cancer Paternal Grandmother   . Bipolar disorder Paternal Uncle     Social History Social History   Tobacco Use  . Smoking status: Current Every Day Smoker    Packs/day: 1.00    Types: Cigarettes  . Smokeless tobacco: Never Used  Substance Use Topics  . Alcohol use: Yes    Comment: Occasionally  . Drug use: No    Review of Systems  Constitutional: Negative for fever. Eyes: Negative for visual changes. ENT: Negative for sore throat. Cardiovascular: Negative for chest pain. Respiratory: Negative for shortness of breath. Gastrointestinal: Negative for abdominal pain, vomiting and diarrhea. Genitourinary: Negative for dysuria. Musculoskeletal: Negative for back pain.  Reports neck and left arm pain as above. Skin: Negative for rash. Neurological: Negative for headaches, focal weakness or numbness. ____________________________________________  PHYSICAL EXAM:  VITAL SIGNS: ED Triage Vitals [09/15/18 1020]  Enc Vitals Group     BP (!) 143/84     Pulse Rate 85     Resp 14     Temp 98.3 F (36.8 C)     Temp Source Oral     SpO2 94 %     Weight      Height      Head Circumference      Peak Flow      Pain Score 10     Pain Loc      Pain Edu?      Excl. in [place]?     Constitutional: Alert and oriented. Well appearing and in no distress. Head: Normocephalic and atraumatic. Eyes: Conjunctivae are normal. Normal extraocular movements Neck: Supple.  Normal range of motion without crepitus.  No distracting midline tenderness is noted. Cardiovascular: Normal rate, regular rhythm. Normal distal pulses. Respiratory: Normal respiratory effort. No wheezes/rales/rhonchi. Gastrointestinal: Soft and nontender. No  distention. Musculoskeletal: Nontender with normal range of motion in all extremities.  Rotator cuff testing bilaterally.  Normal composite fist bilaterally. Neurologic: Cranial nerves II through XII grossly intact.  Normal UEs DTRs bilaterally.  Normal intrinsic and opposition testing noted.  Normal gait without ataxia. Normal speech and language. No gross focal neurologic deficits are appreciated. Skin:  Skin is warm, dry and intact. No rash noted. ___________________________________________   RADIOLOGY  Not Indicated ____________________________________________  PROCEDURES  Procedures ____________________________________________  INITIAL IMPRESSION / ASSESSMENT AND PLAN / ED COURSE  Patient with ED evaluation of intermittent left neck pain and shoulder discomfort after awakening Saturday.  She denies any distal paresthesias or weakness.  Her exam is overall benign reassuring at this time.  No acute neuromuscular deficit is noted.  She likely is experiencing some muscle spasm torticollis and some referred muscle pain in the upper extremity.  She will be discharged with prescriptions for  Anacarolina Shiran Claw was evaluated in Emergency Department on 09/15/2018 for the symptoms described in the history of present illness. She was evaluated in the context of the global COVID-19 pandemic, which necessitated consideration that the patient might be at risk for infection with the SARS-CoV-2 virus that causes COVID-19. Institutional protocols and algorithms that pertain to the evaluation of patients at risk for COVID-19 are in a state of rapid change based on information released by regulatory bodies including the CDC and federal and state organizations. These policies and algorithms were followed during the patient's care in the ED. ____________________________________________  FINAL CLINICAL IMPRESSION(S) / ED DIAGNOSES  Final diagnoses:  Neck pain  Acute pain of left shoulder      Carmie End,  Dannielle Karvonen, PA-C 09/15/18 1739    Earleen Newport, MD 09/16/18 [phone]

## 2018-09-15 NOTE — Discharge Instructions (Signed)
Your exam is consistent with neck strain and muscle spasms. Take the prescription muscle relaxant along with OTC ibuprofen for pain relief. Use topical Biofreeze at work, and alternate ice and heat packs at home. Follow-up with your provider for ongoing symptoms.

## 2018-09-15 NOTE — ED Notes (Signed)
POC was completed urine sent to lab.

## 2018-11-30 ENCOUNTER — Encounter: Payer: Self-pay | Admitting: Family Medicine

## 2018-11-30 ENCOUNTER — Other Ambulatory Visit: Payer: Self-pay

## 2018-11-30 ENCOUNTER — Ambulatory Visit (INDEPENDENT_AMBULATORY_CARE_PROVIDER_SITE_OTHER): Payer: Self-pay | Admitting: Family Medicine

## 2018-11-30 DIAGNOSIS — F411 Generalized anxiety disorder: Secondary | ICD-10-CM

## 2018-11-30 DIAGNOSIS — F3132 Bipolar disorder, current episode depressed, moderate: Secondary | ICD-10-CM

## 2018-11-30 MED ORDER — ARIPIPRAZOLE 2 MG PO TABS: 2.0000 mg | ORAL_TABLET | Freq: Every day | ORAL | 0 refills | Status: DC

## 2018-11-30 MED ORDER — SERTRALINE HCL 50 MG PO TABS: 50.0000 mg | ORAL_TABLET | Freq: Every day | ORAL | 0 refills | Status: DC

## 2018-11-30 NOTE — Progress Notes (Signed)
There were no vitals taken for this visit.   Subjective:    Patient ID: Carrie Burke, female    DOB: September 12, 1990, 28 y.o.   MRN: [id number]  HPI: Carrie Burke is a 28 y.o. female  Chief Complaint  Patient presents with  . Depression  . Anxiety    . This visit was completed via WebEx due to the restrictions of the COVID-19 pandemic. All issues as above were discussed and addressed. Physical exam was done as above through visual confirmation on WebEx. If it was felt that the patient should be evaluated in the office, they were directed there. The patient verbally consented to this visit. . Location of the patient: home . Location of the provider: work . Those involved with this call:  . Provider: [name], PA-C . CMA: Yvonna Alanis, [place] . Front Desk/Registration: Jill Side  . Time spent on call: 25 minutes with patient face to face via video conference. More than 50% of this time was spent in counseling and coordination of care. 5 minutes total spent in review of patient's record and preparation of their chart. I verified patient identity using two factors (patient name and date of birth). Patient consents verbally to being seen via telemedicine visit today.   Had to stop seeing the Psychiatrist earlier this year for her bipolar disorder and anxiety with panic episodes due to insurance issues. Has been off all of her medications since. Was most recently on geodon, prozac, and trazodone but these caused weight gain and other side effects. Did well on abilify in the past. Having compulsive behaviors, feelings of doom, "feels broken inside". Moods swinging significantly. Does not feel ready to seek out counseling at this time, may be willing to reassess in the next month or so. Denies SI/HI.   Depression screen [facility] 2/9 11/30/2018 05/08/2018 04/03/2018  Decreased Interest 2 1 0  Down, Depressed, Hopeless 2 1 0  PHQ - 2 Score 4 2 0  Altered sleeping 3 3 2   Tired,  decreased energy 2 2 2   Change in appetite 3 0 1  Feeling bad or failure about yourself  3 1 0  Trouble concentrating 2 0 0  Moving slowly or fidgety/restless 0 1 0  Suicidal thoughts 0 0 0  PHQ-9 Score 17 9 5   Difficult doing work/chores Somewhat difficult Not difficult at all -  Some recent data might be hidden   GAD 7 : Generalized Anxiety Score 11/30/2018 05/08/2018 04/03/2018 03/02/2018  Nervous, Anxious, on Edge 3 1 1 3   Control/stop worrying 3 1 0 2  Worry too much - different things 3 1 1 2   Trouble relaxing 2 0 1 2  Restless 2 0 0 1  Easily annoyed or irritable 2 1 1 3   Afraid - awful might happen 3 1 0 0  Total GAD 7 Score 18 5 4 13   Anxiety Difficulty Somewhat difficult Not difficult at all Somewhat difficult -    Relevant past medical, surgical, family and social history reviewed and updated as indicated. Interim medical history since our last visit reviewed. Allergies and medications reviewed and updated.  Review of Systems  Per HPI unless specifically indicated above     Objective:    There were no vitals taken for this visit.  Wt Readings from Last 3 Encounters:  05/08/18 211 lb (95.7 kg)  04/20/18 211 lb (95.7 kg)  03/02/18 218 lb 5 oz (99 kg)    Physical Exam Vitals signs and nursing  note reviewed.  Constitutional:      General: She is not in acute distress.    Appearance: Normal appearance.  HENT:     Head: Atraumatic.     Right Ear: External ear normal.     Left Ear: External ear normal.     Nose: Nose normal. No congestion.     Mouth/Throat:     Mouth: Mucous membranes are moist.     Pharynx: Oropharynx is clear. No posterior oropharyngeal erythema.  Eyes:     Extraocular Movements: Extraocular movements intact.     Conjunctiva/sclera: Conjunctivae normal.  Neck:     Musculoskeletal: Normal range of motion.  Cardiovascular:     Comments: Unable to assess via virtual visit Pulmonary:     Effort: Pulmonary effort is normal. No respiratory  distress.  Musculoskeletal: Normal range of motion.  Skin:    General: Skin is dry.     Findings: No erythema.  Neurological:     Mental Status: She is alert and oriented to person, place, and time.  Psychiatric:        Thought Content: Thought content normal.        Judgment: Judgment normal.     Comments: tearful     Results for orders placed or performed during the hospital encounter of 09/15/18  Pregnancy, urine POC  Result Value Ref Range   Preg Test, Ur NEGATIVE NEGATIVE      Assessment & Plan:   Problem List Items Addressed This Visit      Other   Bipolar disorder ([place]) - Primary    Will start zoloft and abilify, consider counseling. Monitor closely, f/u in 1 month for recheck      GAD (generalized anxiety disorder)    Start zoloft, discussed supportive care tactics to help with anxiety. Declines counseling or Psychiatry referral today      Relevant Medications   sertraline (ZOLOFT) 50 MG tablet    25 minutes spent today in direct care and counseling virtually with patient   Follow up plan: Return in about 4 weeks (around 12/28/2018) for Mood f/u.

## 2018-12-03 NOTE — Assessment & Plan Note (Signed)
Will start zoloft and abilify, consider counseling. Monitor closely, f/u in 1 month for recheck

## 2018-12-03 NOTE — Assessment & Plan Note (Signed)
Start zoloft, discussed supportive care tactics to help with anxiety. Declines counseling or Psychiatry referral today

## 2018-12-13 NOTE — Progress Notes (Unsigned)
   THERAPIST PROGRESS NOTE  Session Time: 25  Participation Level: Active  Type of Therapy: Individual Therapy  Treatment Goals addressed: Coping  Interventions: CBT and Motivational Interviewing  Summary: Carrie Burke is a 28 y.o. female who presents with continued symptoms of diagnosis.  Therapist educated Patient on her diagnosis and the symptoms that she displays. Therapist questioned Patient about her perspective on whether or not she's made progress on her treatment goals. Therapist facilitated a therapeutic activity with Patient to assist her with recognizing and identifying her negative thinking patterns and to assist with increasing her positive thinking skills. Therapist reviewed, explained and summarized with Patient all objectives learned and asked questions for clarification. Therapist reviewed and educated Patient on tools and coping skills to assist Patient with working towards achieving her treatment goals.   Suicidal/Homicidal: No   Plan: Return again in 2 weeks.  Diagnosis: Axis I: Bipolar, mixed    Axis II: No diagnosis    Lubertha South, LCSW

## 2018-12-23 ENCOUNTER — Telehealth: Payer: Self-pay | Admitting: Family Medicine

## 2018-12-23 ENCOUNTER — Encounter: Payer: Self-pay | Admitting: Family Medicine

## 2018-12-23 NOTE — Telephone Encounter (Signed)
Called pt to schedule 1 month f/u from 11/16 appointment, no answer, vm not set up, sending letter

## 2019-01-18 ENCOUNTER — Encounter: Payer: Self-pay | Admitting: Family Medicine

## 2019-01-18 ENCOUNTER — Ambulatory Visit (INDEPENDENT_AMBULATORY_CARE_PROVIDER_SITE_OTHER): Payer: 59 | Admitting: Family Medicine

## 2019-01-18 ENCOUNTER — Other Ambulatory Visit: Payer: Self-pay

## 2019-01-18 ENCOUNTER — Telehealth: Payer: Self-pay

## 2019-01-18 VITALS — BP 135/86 | HR 108 | Temp 98.1°F | Ht 67.0 in | Wt 214.0 lb

## 2019-01-18 DIAGNOSIS — F3131 Bipolar disorder, current episode depressed, mild: Secondary | ICD-10-CM

## 2019-01-18 DIAGNOSIS — O24419 Gestational diabetes mellitus in pregnancy, unspecified control: Secondary | ICD-10-CM | POA: Diagnosis not present

## 2019-01-18 DIAGNOSIS — F411 Generalized anxiety disorder: Secondary | ICD-10-CM

## 2019-01-18 DIAGNOSIS — I10 Essential (primary) hypertension: Secondary | ICD-10-CM

## 2019-01-18 DIAGNOSIS — F3162 Bipolar disorder, current episode mixed, moderate: Secondary | ICD-10-CM

## 2019-01-18 DIAGNOSIS — R079 Chest pain, unspecified: Secondary | ICD-10-CM | POA: Diagnosis not present

## 2019-01-18 MED ORDER — ARIPIPRAZOLE 5 MG PO TABS: 5.0000 mg | ORAL_TABLET | Freq: Every day | ORAL | 3 refills | Status: DC

## 2019-01-18 MED ORDER — LOSARTAN POTASSIUM 50 MG PO TABS: 50.0000 mg | ORAL_TABLET | Freq: Every day | ORAL | 3 refills | Status: DC

## 2019-01-18 MED ORDER — HYDROXYZINE PAMOATE 25 MG PO CAPS: 25.0000 mg | ORAL_CAPSULE | Freq: Two times a day (BID) | ORAL | 1 refills | Status: DC | PRN

## 2019-01-18 NOTE — Telephone Encounter (Signed)
Copied from [place] [phone]. Topic: Appointment Scheduling - Transfer of Care >> Jan 18, 2019  9:23 AM Celene Kras wrote: Pt is requesting to transfer FROM: Carrie Burke Pt is requesting to transfer TO: Carrie Burke  Reason for requested transfer: Pt moved and is wanting to switch  Send CRM to patient's current PCP (transferring FROM). >> Jan 18, 2019 10:52 AM Don Perking M wrote: Due to location pt would like to transfer to [facility]. Pt would like a call back to schedule

## 2019-01-18 NOTE — Telephone Encounter (Signed)
I believe this is a FYI because Ms. Orene Desanctis works at [employer] in [place]. KW

## 2019-01-18 NOTE — Assessment & Plan Note (Signed)
A1c today 5.5. Continue to monitor.

## 2019-01-18 NOTE — Assessment & Plan Note (Signed)
Stable, but running high at home. Will start losartan and recheck 2 weeks. Call with any concerns.

## 2019-01-18 NOTE — Telephone Encounter (Signed)
Unable to reach patient at this time, voicemail box has not been set up yet. KW 

## 2019-01-18 NOTE — Telephone Encounter (Signed)
Yes unless there is some policy against it. I'm ok with it.

## 2019-01-18 NOTE — Assessment & Plan Note (Signed)
Not under good control. Did not continue last medication. Will restart 5mg  abilify and recheck 2 weeks- call with any side effects.

## 2019-01-18 NOTE — Assessment & Plan Note (Signed)
Not under good control. Did not continue last medication. Will restart 5mg  abilify and PRN hydroxyzine and recheck 2 weeks- call with any side effects.

## 2019-01-18 NOTE — Progress Notes (Addendum)
BP 135/86   Pulse (!) 108   Temp 98.1 F (36.7 C) (Oral)   Ht _0  (1.702 m)   Wt 214 lb (97.1 kg)   SpO2 98%   BMI 33.52 kg/m    Subjective:    Patient ID: Carrie Burke, female    DOB: 01-28-1990, 29 y.o.   MRN: [id number]  HPI: Carrie Burke is a 29 y.o. female  Chief Complaint  Patient presents with  . Hypertension  . Anxiety  . Chest Pain    middle. on and off for about 2 weeks   Not taking most of her medicine- currently only taking baclofen, clonidine and maxalt. Last seen in November for anxiety- was started on abilify and sertraline. Did not keep follow up appointment in December. Has not been taking any of her medicine.   HYPERTENSION- stopped her amlodipine because she didn't feel like it was working, had clonidine as an emergency medicine.  Hypertension status: uncontrolled  Satisfied with current treatment? no Duration of hypertension: chronic BP monitoring frequency:  a few times a week BP range: 150s-140s/110s-90s BP medication side effects:  no Medication compliance: poor compliance Previous BP meds: clonidine, amlodipine Aspirin: no Recurrent headaches: yes Visual changes: no Palpitations: yes Dyspnea: yes Chest pain: yes Lower extremity edema: no Dizzy/lightheaded: yes  BIPOLAR- stopped seeing psychiatry last year due to issues with her insurance. Had been most recently on geodon, prozac, and trazodone. Stopped them due to weight gain and other side effects. Had done well on abilify in the past which is why it was restarted. She stopped it because she didin't feel like it was working. Had a night terror a couple of nights ago Duration:uncontrolled Anxious mood: yes  Excessive worrying: yes Irritability: yes  Sweating: yes Nausea: no Palpitations:yes Hyperventilation: no Panic attacks: yes Agoraphobia: no  Obscessions/compulsions: yes Depressed mood: yes Depression screen [facility] 2/9 01/18/2019 11/30/2018 05/08/2018 04/03/2018  03/02/2018  Decreased Interest _1 0 1  Down, Depressed, Hopeless _2 0 3  PHQ - 2 Score _3 0 4  Altered sleeping _4 Tired, decreased energy _5 Change in appetite 0 3 0 1 1  Feeling bad or failure about yourself  _6 0 0  Trouble concentrating 3 2 0 0 0  Moving slowly or fidgety/restless 1 0 1 0 1  Suicidal thoughts 0 0 0 0 0  PHQ-9 Score _7 Difficult doing work/chores Very difficult Somewhat difficult Not difficult at all - -  Some recent data might be hidden   GAD 7 : Generalized Anxiety Score 01/18/2019 11/30/2018 05/08/2018 04/03/2018  Nervous, Anxious, on Edge _8 Control/stop worrying _9 0  Worry too much - different things _10 Trouble relaxing 1 2 0 1  Restless 1 2 0 0  Easily annoyed or irritable _11 Afraid - awful might happen _12 0  Total GAD 7 Score _13 Anxiety Difficulty Somewhat difficult Somewhat difficult Not difficult at all Somewhat difficult   Anhedonia: no Weight changes: yes Insomnia: no   Hypersomnia: no Fatigue/loss of energy: yes Feelings of worthlessness: yes Feelings of guilt: yes Impaired concentration/indecisiveness: yes Suicidal ideations: no  Crying spells: no Recent Stressors/Life Changes: yes  CHEST PAIN Duration: off an on for a while- has been more frequently for a  week Onset: sudden Quality: aching and pressure-like Severity: moderate Location: upper sternum Radiation: right arm Episode duration: for a while- until she breathes deep and relaxes Frequency: intermittent Related to exertion: no Activity when pain started: at random Trauma: no Anxiety/recent stressors: yes Status: fluctuating Treatments attempted: nothing  Current pain status: in pain Shortness of breath: no Cough: no Nausea: no Diaphoresis: no Heartburn: no Palpitations: yes   Relevant past medical, surgical, family and social history reviewed and updated as indicated. Interim medical history since  our last visit reviewed. Allergies and medications reviewed and updated.  Review of Systems  Constitutional: Positive for diaphoresis. Negative for activity change, appetite change, chills, fatigue, fever and unexpected weight change.  HENT: Negative.   Respiratory: Positive for shortness of breath. Negative for apnea, cough, choking, chest tightness, wheezing and stridor.   Cardiovascular: Positive for chest pain. Negative for palpitations and leg swelling.  Gastrointestinal: Negative.   Musculoskeletal: Negative.   Skin: Negative.   Psychiatric/Behavioral: Positive for dysphoric mood and sleep disturbance. Negative for agitation, behavioral problems, confusion, decreased concentration, hallucinations, self-injury and suicidal ideas. The patient is nervous/anxious. The patient is not hyperactive.     Per HPI unless specifically indicated above     Objective:    BP 135/86   Pulse (!) 108   Temp 98.1 F (36.7 C) (Oral)   Ht _0  (1.702 m)   Wt 214 lb (97.1 kg)   SpO2 98%   BMI 33.52 kg/m   Wt Readings from Last 3 Encounters:  01/18/19 214 lb (97.1 kg)  05/08/18 211 lb (95.7 kg)  04/20/18 211 lb (95.7 kg)    Physical Exam Vitals and nursing note reviewed.  Constitutional:      General: She is not in acute distress.    Appearance: Normal appearance. She is not ill-appearing, toxic-appearing or diaphoretic.  HENT:     Head: Normocephalic and atraumatic.     Right Ear: External ear normal.     Left Ear: External ear normal.     Nose: Nose normal.     Mouth/Throat:     Mouth: Mucous membranes are moist.     Pharynx: Oropharynx is clear.  Eyes:     General: No scleral icterus.       Right eye: No discharge.        Left eye: No discharge.     Extraocular Movements: Extraocular movements intact.     Conjunctiva/sclera: Conjunctivae normal.     Pupils: Pupils are equal, round, and reactive to light.  Cardiovascular:     Rate and Rhythm: Normal rate and regular rhythm.      Pulses: Normal pulses.     Heart sounds: Normal heart sounds. No murmur. No friction rub. No gallop.   Pulmonary:     Effort: Pulmonary effort is normal. No respiratory distress.     Breath sounds: Normal breath sounds. No stridor. No wheezing, rhonchi or rales.  Chest:     Chest wall: No tenderness.  Musculoskeletal:        General: Normal range of motion.     Cervical back: Normal range of motion and neck supple.  Skin:    General: Skin is warm and dry.     Capillary Refill: Capillary refill takes less than 2 seconds.     Coloration: Skin is not jaundiced or pale.     Findings: No bruising, erythema, lesion or rash.  Neurological:     General: No focal deficit present.  Mental Status: She is alert and oriented to person, place, and time. Mental status is at baseline.  Psychiatric:        Mood and Affect: Mood is anxious.        Behavior: Behavior normal.        Thought Content: Thought content normal.        Judgment: Judgment normal.     Results for orders placed or performed in visit on 01/18/19  Microscopic Examination   BLD  Result Value Ref Range   WBC, UA 6-10 (A) 0 - 5 /hpf   RBC 0-2 0 - 2 /hpf   Epithelial Cells (non renal) 0-10 0 - 10 /hpf   Bacteria, UA Few (A) None seen/Few  Urine Culture, Reflex   BLD  Result Value Ref Range   Urine Culture, Routine WILL FOLLOW   Bayer DCA Hb A1c Waived  Result Value Ref Range   HB A1C (BAYER DCA - WAIVED) 5.5 <7.0 %  Microalbumin, Urine Waived  Result Value Ref Range   Microalb, Ur Waived 30 (H) 0 - 19 mg/L   Creatinine, Urine Waived 300 10 - 300 mg/dL   Microalb/Creat Ratio <30 <30 mg/g  UA/M w/rflx Culture, Routine   Specimen: Blood   BLD  Result Value Ref Range   Specific Gravity, UA 1.025 1.005 - 1.030   pH, UA 5.5 5.0 - 7.5   Color, UA Yellow Yellow   Appearance Ur Clear Clear   Leukocytes,UA 1+ (A) Negative   Protein,UA Negative Negative/Trace   Glucose, UA Negative Negative   Ketones, UA Trace (A)  Negative   RBC, UA Trace (A) Negative   Bilirubin, UA Negative Negative   Urobilinogen, Ur 0.2 0.2 - 1.0 mg/dL   Nitrite, UA Negative Negative   Microscopic Examination See below:    Urinalysis Reflex Comment       Assessment & Plan:   Problem List Items Addressed This Visit      Cardiovascular and Mediastinum   Essential hypertension    Stable, but running high at home. Will start losartan and recheck 2 weeks. Call with any concerns.      Relevant Medications   losartan (COZAAR) 50 MG tablet   Other Relevant Orders   CBC with Differential OUT   Comp Met (CMET)   Microalbumin, Urine Waived (Completed)   UA/M w/rflx Culture, Routine (Completed)     Endocrine   Gestational diabetes mellitus in third trimester    A1c today 5.5. Continue to monitor.      Relevant Medications   losartan (COZAAR) 50 MG tablet   Other Relevant Orders   Bayer DCA Hb A1c Waived (Completed)   CBC with Differential OUT   Comp Met (CMET)   Lipid Panel w/o Chol/HDL Ratio OUT   UA/M w/rflx Culture, Routine (Completed)     Other   Bipolar disorder (HCC)    Not under good control. Did not continue last medication. Will restart 12m abilify and recheck 2 weeks- call with any side effects.       Relevant Orders   CBC with Differential OUT   Comp Met (CMET)   TSH   Bipolar 1 disorder, mixed, moderate (HCC)    Not under good control. Did not continue last medication. Will restart 544mabilify and recheck 2 weeks- call with any side effects.       Relevant Medications   hydrOXYzine (VISTARIL) 25 MG capsule   GAD (generalized anxiety disorder)    Not under good  control. Did not continue last medication. Will restart 33m abilify and PRN hydroxyzine and recheck 2 weeks- call with any side effects.       Relevant Medications   hydrOXYzine (VISTARIL) 25 MG capsule    Other Visit Diagnoses    Chest pain, unspecified type    -  Primary   EKG normal. Likely due to stress and BP. Warning signs  discussed. Will give 2 weeks to get mood and BP better- if no improvement Cardio referral.    Relevant Orders   EKG 12-Lead (Completed)       Follow up plan: Return in about 2 weeks (around 02/01/2019), or follow up mood and BP with either me or RApolonio Schneiders

## 2019-01-19 ENCOUNTER — Encounter: Payer: Self-pay | Admitting: Family Medicine

## 2019-01-19 DIAGNOSIS — E785 Hyperlipidemia, unspecified: Secondary | ICD-10-CM | POA: Insufficient documentation

## 2019-01-19 LAB — CBC WITH DIFFERENTIAL/PLATELET
Basophils Absolute: 0.1 10*3/uL (ref 0.0–0.2)
Basos: 1 %
EOS (ABSOLUTE): 0.2 10*3/uL (ref 0.0–0.4)
Eos: 2 %
Hematocrit: 45.4 % (ref 34.0–46.6)
Hemoglobin: 15.4 g/dL (ref 11.1–15.9)
Immature Grans (Abs): 0 10*3/uL (ref 0.0–0.1)
Immature Granulocytes: 0 %
Lymphocytes Absolute: 2.4 10*3/uL (ref 0.7–3.1)
Lymphs: 24 %
MCH: 32.2 pg (ref 26.6–33.0)
MCHC: 33.9 g/dL (ref 31.5–35.7)
MCV: 95 fL (ref 79–97)
Monocytes Absolute: 0.6 10*3/uL (ref 0.1–0.9)
Monocytes: 6 %
Neutrophils Absolute: 7 10*3/uL (ref 1.4–7.0)
Neutrophils: 67 %
Platelets: 233 10*3/uL (ref 150–450)
RBC: 4.79 x10E6/uL (ref 3.77–5.28)
RDW: 13 % (ref 11.7–15.4)
WBC: 10.4 10*3/uL (ref 3.4–10.8)

## 2019-01-19 LAB — COMPREHENSIVE METABOLIC PANEL
ALT: 35 IU/L — ABNORMAL HIGH (ref 0–32)
AST: 25 IU/L (ref 0–40)
Albumin/Globulin Ratio: 1.6 (ref 1.2–2.2)
Albumin: 4.5 g/dL (ref 3.9–5.0)
Alkaline Phosphatase: 103 IU/L (ref 39–117)
BUN/Creatinine Ratio: 8 — ABNORMAL LOW (ref 9–23)
BUN: 8 mg/dL (ref 6–20)
Bilirubin Total: <0.2 mg/dL (ref 0.0–1.2)
CO2: 23 mmol/L (ref 20–29)
Calcium: 9.3 mg/dL (ref 8.7–10.2)
Chloride: 103 mmol/L (ref 96–106)
Creatinine, Ser: 1.02 mg/dL — ABNORMAL HIGH (ref 0.57–1.00)
GFR calc Af Amer: 85 mL/min/{1.73_m2} (ref >59)
GFR calc non Af Amer: 74 mL/min/{1.73_m2} (ref >59)
Globulin, Total: 2.9 g/dL (ref 1.5–4.5)
Glucose: 106 mg/dL — ABNORMAL HIGH (ref 65–99)
Potassium: 4.1 mmol/L (ref 3.5–5.2)
Sodium: 141 mmol/L (ref 134–144)
Total Protein: 7.4 g/dL (ref 6.0–8.5)

## 2019-01-19 LAB — LIPID PANEL W/O CHOL/HDL RATIO
Cholesterol, Total: 268 mg/dL — ABNORMAL HIGH (ref 100–199)
HDL: 43 mg/dL (ref >39)
LDL Chol Calc (NIH): 113 mg/dL — ABNORMAL HIGH (ref 0–99)
Triglycerides: 635 mg/dL (ref 0–149)
VLDL Cholesterol Cal: 112 mg/dL — ABNORMAL HIGH (ref 5–40)

## 2019-01-19 LAB — TSH: TSH: 3.25 u[IU]/mL (ref 0.450–4.500)

## 2019-01-20 LAB — MICROALBUMIN, URINE WAIVED
Creatinine, Urine Waived: 300 mg/dL (ref 10–300)
Microalb, Ur Waived: 30 mg/L — ABNORMAL HIGH (ref 0–19)
Microalb/Creat Ratio: <30 mg/g (ref <30)

## 2019-01-20 LAB — BAYER DCA HB A1C WAIVED: HB A1C (BAYER DCA - WAIVED): 5.5 % (ref <7.0)

## 2019-01-20 LAB — UA/M W/RFLX CULTURE, ROUTINE
Bilirubin, UA: NEGATIVE
Glucose, UA: NEGATIVE
Nitrite, UA: NEGATIVE
Protein,UA: NEGATIVE
Specific Gravity, UA: 1.025 (ref 1.005–1.030)
Urobilinogen, Ur: 0.2 mg/dL (ref 0.2–1.0)
pH, UA: 5.5 (ref 5.0–7.5)

## 2019-01-20 LAB — MICROSCOPIC EXAMINATION

## 2019-01-20 LAB — URINE CULTURE, REFLEX

## 2019-01-21 ENCOUNTER — Emergency Department: Payer: 59

## 2019-01-21 ENCOUNTER — Ambulatory Visit: Payer: Self-pay | Admitting: *Deleted

## 2019-01-21 ENCOUNTER — Emergency Department
Admission: EM | Admit: 2019-01-21 | Discharge: 2019-01-21 | Disposition: A | Discharge status: Home / Self Care | Payer: 59 | Attending: Student in an Organized Health Care Education/Training Program | Admitting: Student in an Organized Health Care Education/Training Program

## 2019-01-21 ENCOUNTER — Other Ambulatory Visit: Payer: Self-pay

## 2019-01-21 ENCOUNTER — Encounter: Payer: Self-pay | Admitting: Intensive Care

## 2019-01-21 DIAGNOSIS — R071 Chest pain on breathing: Secondary | ICD-10-CM | POA: Insufficient documentation

## 2019-01-21 DIAGNOSIS — Z79899 Other long term (current) drug therapy: Secondary | ICD-10-CM | POA: Insufficient documentation

## 2019-01-21 DIAGNOSIS — F17210 Nicotine dependence, cigarettes, uncomplicated: Secondary | ICD-10-CM | POA: Insufficient documentation

## 2019-01-21 DIAGNOSIS — R0602 Shortness of breath: Secondary | ICD-10-CM | POA: Diagnosis not present

## 2019-01-21 DIAGNOSIS — F411 Generalized anxiety disorder: Secondary | ICD-10-CM | POA: Insufficient documentation

## 2019-01-21 DIAGNOSIS — I10 Essential (primary) hypertension: Secondary | ICD-10-CM | POA: Insufficient documentation

## 2019-01-21 DIAGNOSIS — R0789 Other chest pain: Secondary | ICD-10-CM | POA: Diagnosis present

## 2019-01-21 DIAGNOSIS — R079 Chest pain, unspecified: Secondary | ICD-10-CM

## 2019-01-21 HISTORY — DX: Pure hypercholesterolemia, unspecified: E78.00

## 2019-01-21 LAB — CBC
HCT: 43.5 % (ref 36.0–46.0)
Hemoglobin: 14.4 g/dL (ref 12.0–15.0)
MCH: 31.7 pg (ref 26.0–34.0)
MCHC: 33.1 g/dL (ref 30.0–36.0)
MCV: 95.8 fL (ref 80.0–100.0)
Platelets: 195 10*3/uL (ref 150–400)
RBC: 4.54 MIL/uL (ref 3.87–5.11)
RDW: 13.3 % (ref 11.5–15.5)
WBC: 8.7 10*3/uL (ref 4.0–10.5)
nRBC: 0 % (ref 0.0–0.2)

## 2019-01-21 LAB — POCT PREGNANCY, URINE: Preg Test, Ur: NEGATIVE

## 2019-01-21 LAB — TROPONIN I (HIGH SENSITIVITY)
Troponin I (High Sensitivity): 3 ng/L (ref <18)
Troponin I (High Sensitivity): 3 ng/L (ref <18)

## 2019-01-21 LAB — BASIC METABOLIC PANEL
Anion gap: 10 (ref 5–15)
BUN: 8 mg/dL (ref 6–20)
CO2: 23 mmol/L (ref 22–32)
Calcium: 8.7 mg/dL — ABNORMAL LOW (ref 8.9–10.3)
Chloride: 104 mmol/L (ref 98–111)
Creatinine, Ser: 0.75 mg/dL (ref 0.44–1.00)
GFR calc Af Amer: >60 mL/min (ref >60)
GFR calc non Af Amer: >60 mL/min (ref >60)
Glucose, Bld: 175 mg/dL — ABNORMAL HIGH (ref 70–99)
Potassium: 3.9 mmol/L (ref 3.5–5.1)
Sodium: 137 mmol/L (ref 135–145)

## 2019-01-21 MED ORDER — LIDOCAINE HCL (PF) 1 % IJ SOLN
INTRAMUSCULAR | Status: AC
  Filled 2019-01-21: qty 5

## 2019-01-21 MED ORDER — LORAZEPAM 1 MG PO TABS: 1.0000 mg | ORAL_TABLET | Freq: Three times a day (TID) | ORAL | 0 refills | Status: DC | PRN

## 2019-01-21 MED ORDER — IOHEXOL 350 MG/ML SOLN
75.0000 mL | Freq: Once | INTRAVENOUS | Status: AC | PRN
  Administered 2019-01-21: 75 mL via INTRAVENOUS
  Filled 2019-01-21: qty 75

## 2019-01-21 MED ORDER — LORAZEPAM 1 MG PO TABS
1.0000 mg | ORAL_TABLET | Freq: Once | ORAL | Status: AC
  Administered 2019-01-21: 14:00:00 1 mg via ORAL
  Filled 2019-01-21: qty 1

## 2019-01-21 NOTE — ED Notes (Signed)
Unable to establish iv for ct. Pt set up for ultrasound iv by dr. Quentin Cornwall.

## 2019-01-21 NOTE — ED Triage Notes (Signed)
Patient c/o stabbing central chest pain X1 week with SOB, lightheadedness, and diaphoretic.

## 2019-01-21 NOTE — Telephone Encounter (Signed)
Patient is calling because her BP is high- she did start on the new BP medication- she feels agitated and sweating. BP 140/98- while on hold she is having it checked at work- reporting: BP180/100 P 100- presently- advised ED Reason for Disposition . [id number] Systolic BP  >= 0000000 OR Diastolic >= [id number] AND [id number] cardiac or neurologic symptoms (e.g., chest pain, difficulty breathing, unsteady gait, blurred vision)  Answer Assessment - Initial Assessment Questions 1. BLOOD PRESSURE: "What is the blood pressure?" "Did you take at least two measurements 5 minutes apart?"     148/98  2. ONSET: "When did you take your blood pressure?"     7:00 am 3. HOW: "How did you obtain the blood pressure?" (e.g., visiting nurse, automatic home BP monitor)     Automatic cuff- arm 4. HISTORY: "Do you have a history of high blood pressure?"     yes 5. MEDICATIONS: "Are you taking any medications for blood pressure?" "Have you missed any doses recently?"     changed medication - Monday evening losartan 50 mg 6. OTHER SYMPTOMS: "Do you have any symptoms?" (e.g., headache, chest pain, blurred vision, difficulty breathing, weakness)     Agitation, sweating- hands cold but sweating- constant, chest tightness to the right - feels like elephant on chest at times. 7. PREGNANCY: "Is there any chance you are pregnant?" "When was your last menstrual period?"     No- LMP-12/15  Protocols used: HIGH BLOOD PRESSURE-A-AH

## 2019-01-21 NOTE — ED Provider Notes (Signed)
[facility] Emergency Department Provider Note    First MD Initiated Contact with Patient 01/21/19 1312     (approximate)  I have reviewed the triage vital signs and the nursing notes.   HISTORY  Chief Complaint Chest Pain    HPI Carrie Burke is a 29 y.o. female the below listed past medical history presents to the ER for evaluation of 1 week of chest pain shortness of breath that became acutely worse today.  She was very stressed out about her blood pressure being elevated 180/100.  Did feel like she is having pressure in her chest associated with some shortness of breath and discomfort taking deep inspiration.  No fevers.  No cough or congestion.  Did have recent blood pressure medication change does not feel like it is controlling her blood pressure.    Past Medical History:  Diagnosis Date  . Anxiety   . Bipolar disorder ([place])   . Common migraine with intractable migraine 03/18/2017  . Depression   . Elevated serum glutamic pyruvic transaminase (SGPT) level   . Gait abnormality 03/18/2017  . Gestational diabetes   . High cholesterol   . High-risk pregnancy 01/24/2014  . History of pre-eclampsia   . History of pre-eclampsia in prior pregnancy, currently pregnant in third trimester 01/24/2014  . Polyhydramnios in third trimester 02/11/2014  . Preeclampsia   . Vitamin D deficiency disease    Family History  Problem Relation Age of Onset  . Arthritis Mother   . Breast cancer Mother 43  . Depression Mother   . Anxiety disorder Mother   . Heart disease Father   . Hyperlipidemia Father   . Hypertension Father   . Asthma Brother   . Breast cancer Maternal Grandmother   . Breast cancer Paternal Grandmother   . Bipolar disorder Paternal Uncle    Past Surgical History:  Procedure Laterality Date  . CLAVICLE SURGERY  2014  . TUBAL LIGATION     Patient Active Problem List   Diagnosis Date Noted  . Hyperlipidemia 01/19/2019  . Bipolar 1  disorder, mixed, moderate ([place]) 07/20/2018  . GAD (generalized anxiety disorder) 07/20/2018  . Sleep apnea 05/13/2018  . Allergic rhinitis 04/23/2018  . Essential hypertension 09/07/2017  . Obesity (BMI 30.0-34.9) 03/26/2017  . Gait abnormality 03/18/2017  . Common migraine with intractable migraine 03/18/2017  . Insomnia 04/09/2015  . Tobacco use disorder 03/22/2015  . Pain in right shoulder 02/27/2015  . Vitamin D deficiency disease   . Bipolar disorder ([place])   . Gestational diabetes mellitus in third trimester 01/24/2014  . Clavicle fracture 03/10/2012      Prior to Admission medications   Medication Sig Start Date End Date Taking? Authorizing Provider  ARIPiprazole (ABILIFY) 5 MG tablet Take 1 tablet (5 mg total) by mouth daily. 01/18/19   Johnson, Megan P, DO  baclofen (LIORESAL) 10 MG tablet Take 0.5 tablets (5 mg total) by mouth 2 (two) times daily. 08/31/18   Suzzanne Cloud, NP  hydrOXYzine (VISTARIL) 25 MG capsule Take 1 capsule (25 mg total) by mouth 2 (two) times daily as needed. Only for severe anxiety attacks 01/18/19   Park Liter P, DO  LORazepam (ATIVAN) 1 MG tablet Take 1 tablet (1 mg total) by mouth every 8 (eight) hours as needed for anxiety. 01/21/19 01/21/20  Merlyn Lot, MD  losartan (COZAAR) 50 MG tablet Take 1 tablet (50 mg total) by mouth daily. 01/18/19   Johnson, Megan P, DO  rizatriptan (MAXALT)  10 MG tablet Take 1 tablet (10 mg total) by mouth 3 (three) times daily as needed for migraine. Patient not taking: Reported on 11/30/2018 05/11/18   Suzzanne Cloud, NP    Allergies Patient has no known allergies.    Social History Social History   Tobacco Use  . Smoking status: Current Every Day Smoker    Packs/day: 1.00    Types: Cigarettes  . Smokeless tobacco: Never Used  Substance Use Topics  . Alcohol use: Yes    Comment: Occasionally  . Drug use: No    Review of Systems Patient denies headaches, rhinorrhea, blurry vision, numbness, shortness of  breath, chest pain, edema, cough, abdominal pain, nausea, vomiting, diarrhea, dysuria, fevers, rashes or hallucinations unless otherwise stated above in HPI. ____________________________________________   PHYSICAL EXAM:  VITAL SIGNS: Vitals:   01/21/19 1201 01/21/19 1435  BP: (!) 149/87 (!) 126/91  Pulse: 97 (!) 101  Resp: 16 18  Temp: 98.1 F (36.7 C)   SpO2: 100% 98%    Constitutional: Alert and oriented.  Eyes: Conjunctivae are normal.  Head: Atraumatic. Nose: No congestion/rhinnorhea. Mouth/Throat: Mucous membranes are moist.   Neck: No stridor. Painless ROM.  Cardiovascular: Normal rate, regular rhythm. Grossly normal heart sounds.  Good peripheral circulation. Respiratory: Normal respiratory effort.  No retractions. Lungs CTAB. Gastrointestinal: Soft and nontender. No distention. No abdominal bruits. No CVA tenderness. Genitourinary:  Musculoskeletal: No lower extremity tenderness nor edema.  No joint effusions. Neurologic:  Normal speech and language. No gross focal neurologic deficits are appreciated. No facial droop Skin:  Skin is warm, dry and intact. No rash noted. Psychiatric: anxious appearing  ____________________________________________   LABS (all labs ordered are listed, but only abnormal results are displayed)  Results for orders placed or performed during the hospital encounter of 01/21/19 (from the past 24 hour(s))  Basic metabolic panel     Status: Abnormal   Collection Time: 01/21/19 12:05 PM  Result Value Ref Range   Sodium 137 135 - 145 mmol/L   Potassium 3.9 3.5 - 5.1 mmol/L   Chloride 104 98 - 111 mmol/L   CO2 23 22 - 32 mmol/L   Glucose, Bld 175 (H) 70 - 99 mg/dL   BUN 8 6 - 20 mg/dL   Creatinine, Ser 0.75 0.44 - 1.00 mg/dL   Calcium 8.7 (L) 8.9 - 10.3 mg/dL   GFR calc non Af Amer >60 >60 mL/min   GFR calc Af Amer >60 >60 mL/min   Anion gap 10 5 - 15  CBC     Status: None   Collection Time: 01/21/19 12:05 PM  Result Value Ref Range    WBC 8.7 4.0 - 10.5 K/uL   RBC 4.54 3.87 - 5.11 MIL/uL   Hemoglobin 14.4 12.0 - 15.0 g/dL   HCT 43.5 36.0 - 46.0 %   MCV 95.8 80.0 - 100.0 fL   MCH 31.7 26.0 - 34.0 pg   MCHC 33.1 30.0 - 36.0 g/dL   RDW 13.3 11.5 - 15.5 %   Platelets 195 150 - 400 K/uL   nRBC 0.0 0.0 - 0.2 %  Troponin I (High Sensitivity)     Status: None   Collection Time: 01/21/19 12:05 PM  Result Value Ref Range   Troponin I (High Sensitivity) 3 <18 ng/L  Troponin I (High Sensitivity)     Status: None   Collection Time: 01/21/19  2:35 PM  Result Value Ref Range   Troponin I (High Sensitivity) 3 <18 ng/L  Pregnancy,  urine POC     Status: None   Collection Time: 01/21/19  2:45 PM  Result Value Ref Range   Preg Test, Ur NEGATIVE NEGATIVE   ____________________________________________  EKG My review and personal interpretation at Time: 12:05   Indication: chest pain  Rate: 100  Rhythm: sinus Axis: normal Other: normal intervals, no stemi ____________________________________________  RADIOLOGY  I personally reviewed all radiographic images ordered to evaluate for the above acute complaints and reviewed radiology reports and findings.  These findings were personally discussed with the patient.  Please see medical record for radiology report.  ____________________________________________   PROCEDURES  Procedure(s) performed:  Procedures Due to difficulty with obtaining IV access, a 20G peripheral IV catheter was inserted using US guidance into the LUE.  The site was prepped with chlorhexidine and allowed to dry.  The patient tolerated the procedure without any complications.    Critical Care performed: no ____________________________________________   INITIAL IMPRESSION / ASSESSMENT AND PLAN / ED COURSE  Pertinent labs & imaging results that were available during my care of the patient were reviewed by me and considered in my medical decision making (see chart for details).   DDX: ACS, pericarditis,  esophagitis, boerhaaves, pe, dissection, pna, bronchitis, costochondritis   Carrie Burke is a 29 y.o. who presents to the ED with symptoms as described above.  Patient nontoxic-appearing symptoms seem very atypical for ACS.  However will order serial enzymes..  Low risk by heart score.  Will order CT angiogram to evaluate for PE.  Patient does appear somewhat anxious and is requesting something to help with calming her nerves.  Clinical Course as of Jan 21 1552  Thu Jan 21, 2019  1529 Patient reassessed.  Feels well.  She appears well clinically.  Serial enzymes are negative.  CTA is reassuring.  Felt improved after Ativan.  Do suspect some component of stress contributing to her symptoms and patient admits that could be anxiety as well.  Have recommended follow-up with PCP as well as referral to psychiatry.  No indication for hospitalization at this time.   [PR]    Clinical Course User Index [PR] Merlyn Lot, MD    The patient was evaluated in Emergency Department today for the symptoms described in the history of present illness. He/she was evaluated in the context of the global COVID-19 pandemic, which necessitated consideration that the patient might be at risk for infection with the SARS-CoV-2 virus that causes COVID-19. Institutional protocols and algorithms that pertain to the evaluation of patients at risk for COVID-19 are in a state of rapid change based on information released by regulatory bodies including the CDC and federal and state organizations. These policies and algorithms were followed during the patient's care in the ED.  As part of my medical decision making, I reviewed the following data within the [place] notes reviewed and incorporated, Labs reviewed, notes from prior ED visits and [place] Controlled Substance Database   ____________________________________________   FINAL CLINICAL IMPRESSION(S) / ED DIAGNOSES  Final diagnoses:    Chest pain, unspecified type      NEW MEDICATIONS STARTED DURING THIS VISIT:  Discharge Medication List as of 01/21/2019  3:32 PM    START taking these medications   Details  LORazepam (ATIVAN) 1 MG tablet Take 1 tablet (1 mg total) by mouth every 8 (eight) hours as needed for anxiety., Starting Thu 01/21/2019, Until Fri 01/21/2020, Normal         Note:  This document  was prepared using Systems analyst and may include unintentional dictation errors.    Merlyn Lot, MD 01/21/19 [phone]

## 2019-01-21 NOTE — Telephone Encounter (Signed)
Me too, yes I agree if you have reached out. She can reach back out. Looks like she established care.

## 2019-01-22 ENCOUNTER — Telehealth: Payer: Self-pay | Admitting: Family Medicine

## 2019-01-22 ENCOUNTER — Encounter: Payer: Self-pay | Admitting: Family Medicine

## 2019-01-22 NOTE — Telephone Encounter (Signed)
Copied from [place] [id number]. Topic: Quick Communication - Rx Refill/Question >> Jan 22, 2019  4:29 PM Yvette Rack wrote: Medication: LORazepam (ATIVAN) 1 MG tablet  Has the patient contacted their pharmacy? no  Preferred Pharmacy (with phone number or street name): [facility] [place], [place] Phone: [phone]   Fax: [phone]  Agent: Please be advised that RX refills may take up to 3 business days. We ask that you follow-up with your pharmacy.

## 2019-01-25 NOTE — Telephone Encounter (Signed)
Patient notified

## 2019-01-25 NOTE — Telephone Encounter (Signed)
This looks like it was only prescribed acutely in ER. Will not refill. She has an appt coming up with Dr. Wynetta Emery

## 2019-01-27 ENCOUNTER — Other Ambulatory Visit: Payer: Self-pay

## 2019-01-27 ENCOUNTER — Ambulatory Visit (INDEPENDENT_AMBULATORY_CARE_PROVIDER_SITE_OTHER): Payer: PRIVATE HEALTH INSURANCE | Admitting: Licensed Clinical Social Worker

## 2019-01-27 DIAGNOSIS — F3162 Bipolar disorder, current episode mixed, moderate: Secondary | ICD-10-CM | POA: Diagnosis not present

## 2019-01-27 DIAGNOSIS — F411 Generalized anxiety disorder: Secondary | ICD-10-CM
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
## 2019-01-28 ENCOUNTER — Encounter: Payer: Self-pay | Admitting: Psychiatry

## 2019-01-28 ENCOUNTER — Other Ambulatory Visit: Payer: Self-pay

## 2019-01-28 ENCOUNTER — Ambulatory Visit (INDEPENDENT_AMBULATORY_CARE_PROVIDER_SITE_OTHER): Payer: Self-pay | Admitting: Psychiatry

## 2019-01-28 DIAGNOSIS — F411 Generalized anxiety disorder: Secondary | ICD-10-CM

## 2019-01-28 DIAGNOSIS — F3162 Bipolar disorder, current episode mixed, moderate: Secondary | ICD-10-CM

## 2019-01-28 DIAGNOSIS — F410 Panic disorder [episodic paroxysmal anxiety] without agoraphobia: Secondary | ICD-10-CM

## 2019-01-28 DIAGNOSIS — F17200 Nicotine dependence, unspecified, uncomplicated: Secondary | ICD-10-CM

## 2019-01-28 DIAGNOSIS — F1011 Alcohol abuse, in remission: Secondary | ICD-10-CM

## 2019-01-28 MED ORDER — ESCITALOPRAM OXALATE 5 MG PO TABS: 5.0000 mg | ORAL_TABLET | Freq: Every day | ORAL | 1 refills | Status: DC

## 2019-01-28 MED ORDER — LORAZEPAM 0.5 MG PO TABS: 0.5000 mg | ORAL_TABLET | ORAL | 0 refills | Status: DC

## 2019-01-28 NOTE — Progress Notes (Signed)
Virtual Visit via Video Note  I connected with Carrie Burke on 01/28/19 at 10:15 AM EST by a video enabled telemedicine application and verified that I am speaking with the correct person using two identifiers.   I discussed the limitations of evaluation and management by telemedicine and the availability of in person appointments. The patient expressed understanding and agreed to proceed.     I discussed the assessment and treatment plan with the patient. The patient was provided an opportunity to ask questions and all were answered. The patient agreed with the plan and demonstrated an understanding of the instructions.   The patient was advised to call back or seek an in-person evaluation if the symptoms worsen or if the condition fails to improve as anticipated.  [facility] MD OP Progress Note  01/28/2019 11:40 AM Carrie Burke  MRN:  [id number]  Chief Complaint:  Chief Complaint    Follow-up     HPI: Carrie Burke is a 29 year old Caucasian female, married, employed, lives in [place], has a history of bipolar disorder, GAD, chronic migraine, hypertension, history of lipoma, alcohol abuse, tobacco use disorder was evaluated by telemedicine today.  Patient was last seen on 07/20/2018.  At that visit patient was advised to return in a month however patient never showed up.  Today patient returns for an appointment reporting that she had lost her health insurance plan and hence could not keep up with her appointments.  She reports she recently went to the emergency department since her blood pressure was too high and she also had panic symptoms.  She however reports she has been noncompliant with all her medications including her antihypertensive medication.  She reports she however is back on her antihypertensive medications and her blood pressure continues to stay high.  Patient reports she is currently struggling with anxiety symptoms.  She feels fidgety and restless often.  She  reports she has a lot of racing thoughts and cannot focus on one thing at a time.  She reports she also has panic attacks that she has racing heart rate and feels like she breaks out into a sweat in the middle of the night.  She reports when she went to the emergency department she was prescribed lorazepam and that did help her.  Patient today reports she she could not stay on the Geodon which was prescribed by writer due to not having a health insurance coverage.  She reports she went back on the Abilify recently.  Although patient had reported to writer when she came to her appointment initially that she was having side effects to Abilify as well as it was making her more anxious, today she reports the Abilify is effective.  She reports she wants to stay on the Abilify at this point.  She reports she does have sleep problems and she takes diphenhydramine over-the-counter 50 mg.  She reports that may be helpful to some extent.  Patient however reports today that she cannot take the hydroxyzine since it is making her blood pressure to go up and is also giving her side effects.    Patient had reported to writer that she was misusing alcohol, binging on alcohol on weekends during her previous visit.  Patient however today reports she does not binge on alcohol anymore and the last time she had a drink was a glass of wine during the holiday season.  Patient continues to smoke cigarettes and reports she is motivated to start cutting back on quitting.  Patient denies  any suicidality, homicidality or perceptual disturbances.   Visit Diagnosis:    ICD-10-CM   1. Bipolar 1 disorder, mixed, moderate (HCC)  F31.62   2. GAD (generalized anxiety disorder)  F41.1 escitalopram (LEXAPRO) 5 MG tablet  3. Panic attacks  F41.0 LORazepam (ATIVAN) 0.5 MG tablet    escitalopram (LEXAPRO) 5 MG tablet  4. Tobacco use disorder  F17.200   5. History of alcohol abuse  F10.11     Past Psychiatric History: I have  reviewed past psychiatric history from my progress note on 06/10/2018.  Past Medical History:  Past Medical History:  Diagnosis Date  . Anxiety   . Bipolar disorder ([place])   . Common migraine with intractable migraine 03/18/2017  . Depression   . Elevated serum glutamic pyruvic transaminase (SGPT) level   . Gait abnormality 03/18/2017  . Gestational diabetes   . High cholesterol   . High-risk pregnancy 01/24/2014  . History of pre-eclampsia   . History of pre-eclampsia in prior pregnancy, currently pregnant in third trimester 01/24/2014  . Polyhydramnios in third trimester 02/11/2014  . Preeclampsia   . Vitamin D deficiency disease     Past Surgical History:  Procedure Laterality Date  . CLAVICLE SURGERY  2014  . TUBAL LIGATION      Family Psychiatric History: I have reviewed family psychiatric history from my progress note on 06/10/2018.  Family History:  Family History  Problem Relation Age of Onset  . Arthritis Mother   . Breast cancer Mother 49  . Depression Mother   . Anxiety disorder Mother   . Heart disease Father   . Hyperlipidemia Father   . Hypertension Father   . Asthma Brother   . Breast cancer Maternal Grandmother   . Breast cancer Paternal Grandmother   . Bipolar disorder Paternal Uncle     Social History: I have reviewed social history from my progress note on 06/10/2018. Social History   Socioeconomic History  . Marital status: Married    Spouse name: jame  . Number of children: 3  . Years of education: Not on file  . Highest education level: High school graduate  Occupational History  . Not on file  Tobacco Use  . Smoking status: Current Every Day Smoker    Packs/day: 1.00    Types: Cigarettes  . Smokeless tobacco: Never Used  Substance and Sexual Activity  . Alcohol use: Yes    Comment: Occasionally  . Drug use: No  . Sexual activity: Yes    Birth control/protection: Surgical  Other Topics Concern  . Not on file  Social History Narrative    Lives with husband and kids   Caffeine use: coffee daily   Right handed    Social Determinants of Health   Financial Resource Strain: Low Risk   . Difficulty of Paying Living Expenses: Not hard at all  Food Insecurity: No Food Insecurity  . Worried About Charity fundraiser in the Last Year: Never true  . Ran Out of Food in the Last Year: Never true  Transportation Needs: No Transportation Needs  . Lack of Transportation (Medical): No  . Lack of Transportation (Non-Medical): No  Physical Activity: Inactive  . Days of Exercise per Week: 0 days  . Minutes of Exercise per Session: 0 min  Stress: Stress Concern Present  . Feeling of Stress : Very much  Social Connections: Unknown  . Frequency of Communication with Friends and Family: Not on file  . Frequency of Social Gatherings with Friends  and Family: Not on file  . Attends Religious Services: Never  . Active Member of Clubs or Organizations: No  . Attends Archivist Meetings: Never  . Marital Status: Married    Allergies: No Known Allergies  Metabolic Disorder Labs: Lab Results  Component Value Date   HGBA1C 5.5 01/18/2019   No results found for: PROLACTIN Lab Results  Component Value Date   CHOL 268 (H) 01/18/2019   TRIG 635 (HH) 01/18/2019   HDL 43 01/18/2019   LDLCALC 113 (H) 01/18/2019   LDLCALC 102 (H) 02/27/2015   Lab Results  Component Value Date   TSH 3.250 01/18/2019   TSH 2.570 09/04/2017    Therapeutic Level Labs: No results found for: LITHIUM No results found for: VALPROATE No components found for:  CBMZ  Current Medications: Current Outpatient Medications  Medication Sig Dispense Refill  . ARIPiprazole (ABILIFY) 5 MG tablet Take 1 tablet (5 mg total) by mouth daily. 30 tablet 3  . baclofen (LIORESAL) 10 MG tablet Take 0.5 tablets (5 mg total) by mouth 2 (two) times daily. 90 each 3  . cloNIDine (CATAPRES) 0.1 MG tablet Take 0.1 mg by mouth daily as needed.    Marland Kitchen losartan (COZAAR) 50 MG  tablet Take 1 tablet (50 mg total) by mouth daily. 30 tablet 3  . rizatriptan (MAXALT) 10 MG tablet Take 1 tablet (10 mg total) by mouth 3 (three) times daily as needed for migraine. 10 tablet 3  . escitalopram (LEXAPRO) 5 MG tablet Take 1 tablet (5 mg total) by mouth daily with supper. 30 tablet 1  . LORazepam (ATIVAN) 0.5 MG tablet Take 1 tablet (0.5 mg total) by mouth as directed. Take one tablet up to 2-3 times a week only for severe panic attacks 10 tablet 0   No current facility-administered medications for this visit.     Musculoskeletal: Strength & Muscle Tone: UTA Gait & Station: normal Patient leans: N/A  Psychiatric Specialty Exam: Review of Systems  Psychiatric/Behavioral: Positive for sleep disturbance. The patient is nervous/anxious.   All other systems reviewed and are negative.   Last menstrual period 12/29/2018.There is no height or weight on file to calculate BMI.  General Appearance: Casual  Eye Contact:  Fair  Speech:  Clear and Coherent  Volume:  Normal  Mood:  Anxious  Affect:  Congruent  Thought Process:  Goal Directed and Descriptions of Associations: Intact  Orientation:  Full (Time, Place, and Person)  Thought Content: Logical   Suicidal Thoughts:  No  Homicidal Thoughts:  No  Memory:  Immediate;   Fair Recent;   Fair Remote;   Fair  Judgement:  Fair  Insight:  Fair  Psychomotor Activity:  Normal  Concentration:  Concentration: Fair and Attention Span: Fair  Recall:  [employer] of Knowledge: Fair  Language: Fair  Akathisia:  No  Handed:  Right  AIMS (if indicated): denies tremors, rigidity  Assets:  Communication Skills Desire for Improvement Housing Social Support  ADL's:  Intact  Cognition: WNL  Sleep:  Restless   Screenings: GAD-7     Office Visit from 01/18/2019 in [facility] Visit from 11/30/2018 in [facility] Visit from 05/08/2018 in [facility] Visit from 04/03/2018 in  [facility] Visit from 03/02/2018 in [facility]  Total GAD-7 Score  13  18  5  4  13     PHQ2-9     Office Visit from 01/18/2019 in [facility]  Practice Office Visit from 11/30/2018 in [facility] Visit from 05/08/2018 in [facility] Visit from 04/03/2018 in [facility] Visit from 03/02/2018 in [facility]  PHQ-2 Total Score  2  4  2   0  4  PHQ-9 Total Score  12  17  9  5  10        Assessment and Plan: Naiema is a 29 year old Caucasian female, employed, lives in [place], married, has a history of migraine headaches, hypertension, lipoma, bipolar disorder, generalized anxiety disorder, panic attacks, tobacco use disorder was evaluated by telemedicine today.  She is biologically predisposed given her family history of mental health problems as well as her own medical issues.  Patient with psychosocial stressors of the current pandemic as well as her own health issues.  Patient was last seen in July 2020 and was noncompliant with medications as well as follow-up recommendations.  Patient today returns reporting worsening anxiety symptoms.  Plan as noted below.  Plan Bipolar disorder-some progress Abilify 5 mg p.o. daily  GAD-unstable Discontinue Prozac for noncompliance Start Lexapro 5 mg p.o. daily with supper. Provided medication education.  Discussed with patient to call writer if she does not tolerate the medication well.  At that point other medication options can be discussed. Discussed with patient the importance of psychotherapy sessions and the need for frequent follow-up sessions.  Panic attacks-unstable Patient was prescribed lorazepam in the emergency department which seems to have helped her anxiety and maybe her blood pressure to some extent.  However it is likely that patient's elevated blood pressure could also have been because she was not compliant on all her medications  due to health insurance coverage problems. Discussed with patient lorazepam is not a long-term solution for her anxiety symptoms.  Advised her to work with her therapist on a more frequent basis and to stay compliant on therapy and medications. Educated her about the risk of long-term benzodiazepine therapy and also discussed with her that it will only be prescribed for a short period of time and also she has to limit use. Will change lorazepam to 0.5 mg p.o. as needed for severe panic attacks-2-3 times a week only. I have reviewed [place] controlled substance database.  For tobacco use disorder-unstable Provided smoking cessation counseling  For history of alcohol abuse-episodic-in early remission Patient currently denies any alcohol abuse/binging.  Will monitor closely.  Discussed with her the risk of combining alcohol with her medications especially her benzodiazepines.  Reviewed labs-TSH in [facility] dated 01/18/2019-within normal limits, lipid panel-elevated -she will continue to work with her primary care provider CMP-ALT-elevated at 35, creatinine-1.02 elevated.  Patient will continue to work with her primary care provider.   I have reviewed medical records in [facility] dated 01/21/2019-emergency department visit per Dr. Merlyn Lot-' patient is a 29 year old female presented to the ER for evaluation of 1 week of chest pain, shortness of breath that became acutely worse today.  She was stressed out about her blood pressure being elevated at 180/100.  Patient is having pressure in her chest associated with shortness of breath and discomfort taking deep inspiration.  Patient's symptoms seem very atypical of ACS however serial enzymes were ordered.  Will order CT angiogram to evaluate for PE. Serial enzymes and CTA reassuring.  Recommended follow-up with PCP as well as psychiatry.  No indication for hospitalization at this time.'  Follow-up in clinic in 2 to 3 weeks or sooner if needed.  February 3  at 9  AM  I have spent atleast 30 minutes non face to face with patient today. More than 50 % of the time was spent for preparing to see the patient ( e.g., review of test, records ), obtaining and to review and separately obtained history , ordering medications and test ,psychoeducation and supportive psychotherapy and care coordination,as well as documenting clinical information in electronic health record,interpreting results of test and communication of results This note was generated in part or whole with voice recognition software. Voice recognition is usually quite accurate but there are transcription errors that can and very often do occur. I apologize for any typographical errors that were not detected and corrected.       Ursula Alert, MD 01/28/2019, 11:40 AM

## 2019-02-05 ENCOUNTER — Other Ambulatory Visit: Payer: Self-pay

## 2019-02-05 ENCOUNTER — Encounter: Payer: Self-pay | Admitting: Family Medicine

## 2019-02-05 ENCOUNTER — Ambulatory Visit (INDEPENDENT_AMBULATORY_CARE_PROVIDER_SITE_OTHER): Payer: 59 | Admitting: Family Medicine

## 2019-02-05 VITALS — BP 111/76 | HR 92 | Temp 98.5°F

## 2019-02-05 DIAGNOSIS — F411 Generalized anxiety disorder: Secondary | ICD-10-CM | POA: Diagnosis not present

## 2019-02-05 DIAGNOSIS — I10 Essential (primary) hypertension: Secondary | ICD-10-CM | POA: Diagnosis not present

## 2019-02-05 MED ORDER — LOSARTAN POTASSIUM 50 MG PO TABS: 50.0000 mg | ORAL_TABLET | Freq: Every day | ORAL | 1 refills | Status: DC

## 2019-02-05 NOTE — Assessment & Plan Note (Signed)
Under good control on current regimen. Continue current regimen. Continue to monitor. Call with any concerns. Refills given. Labs drawn today.   

## 2019-02-05 NOTE — Assessment & Plan Note (Signed)
Working with psychiatry. To have follow up but none booked- appointment confirmed with psychiatry and patient alerted. Continue to follow with psychiatry. Call with any concerns.

## 2019-02-05 NOTE — Progress Notes (Signed)
BP 111/76   Pulse 92   Temp 98.5 F (36.9 C)   SpO2 96%    Subjective:    Patient ID: Carrie Burke, female    DOB: 01/13/1991, 29 y.o.   MRN: [id number]  HPI: Carrie Burke is a 29 y.o. female  Chief Complaint  Patient presents with  . Hypertension  . Anxiety   HYPERTENSION Hypertension status: controlled  Satisfied with current treatment? yes Duration of hypertension: chronic BP monitoring frequency:  a few times a week BP range: 155/105 last night- had just come home from work, had been feeling  BP medication side effects:  no Medication compliance: excellent compliance Previous BP meds: losartan Aspirin: no Recurrent headaches: no Visual changes: no Palpitations: no Dyspnea: no Chest pain: yes- with anxiety Lower extremity edema: no Dizzy/lightheaded: no  She established with Dr. Shea Evans on 01/28/19. She was continued on the abilify, and started on lexapro. She was given lorazepam to be used 2-3x a week for panic attacks. She has a follow up with them scheduled for 02/17/19 ANXIETY/STRESS Duration:better Anxious mood: yes  Excessive worrying: yes Irritability: yes  Sweating: no Nausea: no Palpitations:no Hyperventilation: no Panic attacks: yes Agoraphobia: no  Obscessions/compulsions: no Depressed mood: yes Depression screen [facility] 2/9 02/05/2019 01/18/2019 11/30/2018 05/08/2018 04/03/2018  Decreased Interest 1 1 2 1  0  Down, Depressed, Hopeless 1 1 2 1  0  PHQ - 2 Score 2 2 4 2  0  Altered sleeping 2 3 3 3 2   Tired, decreased energy 2 2 2 2 2   Change in appetite 1 0 3 0 1  Feeling bad or failure about yourself  1 1 3 1  0  Trouble concentrating 3 3 2  0 0  Moving slowly or fidgety/restless 0 1 0 1 0  Suicidal thoughts 0 0 0 0 0  PHQ-9 Score 11 12 17 9 5   Difficult doing work/chores Very difficult Very difficult Somewhat difficult Not difficult at all -  Some recent data might be hidden   GAD 7 : Generalized Anxiety Score 02/05/2019 01/18/2019  11/30/2018 05/08/2018  Nervous, Anxious, on Edge 2 3 3 1   Control/stop worrying 1 2 3 1   Worry too much - different things 1 2 3 1   Trouble relaxing 2 1 2  0  Restless 2 1 2  0  Easily annoyed or irritable 2 3 2 1   Afraid - awful might happen 1 1 3 1   Total GAD 7 Score 11 13 18 5   Anxiety Difficulty Somewhat difficult Somewhat difficult Somewhat difficult Not difficult at all   Anhedonia: no Weight changes: no Insomnia: yes hard to fall asleep  Hypersomnia: no Fatigue/loss of energy: yes Feelings of worthlessness: no Feelings of guilt: no Impaired concentration/indecisiveness: no Suicidal ideations: no  Crying spells: no Recent Stressors/Life Changes: yes   Relevant past medical, surgical, family and social history reviewed and updated as indicated. Interim medical history since our last visit reviewed. Allergies and medications reviewed and updated.  Review of Systems  Constitutional: Negative.   HENT: Negative.   Respiratory: Negative.   Cardiovascular: Positive for chest pain. Negative for palpitations and leg swelling.  Skin: Negative.   Neurological: Negative.   Psychiatric/Behavioral: Positive for dysphoric mood and sleep disturbance. Negative for agitation, behavioral problems, confusion, decreased concentration, hallucinations, self-injury and suicidal ideas. The patient is nervous/anxious. The patient is not hyperactive.     Per HPI unless specifically indicated above     Objective:    BP 111/76   Pulse 92  Temp 98.5 F (36.9 C)   SpO2 96%   Wt Readings from Last 3 Encounters:  01/21/19 215 lb (97.5 kg)  01/18/19 214 lb (97.1 kg)  05/08/18 211 lb (95.7 kg)    Physical Exam Vitals and nursing note reviewed.  Constitutional:      General: She is not in acute distress.    Appearance: Normal appearance. She is not ill-appearing, toxic-appearing or diaphoretic.  HENT:     Head: Normocephalic and atraumatic.     Right Ear: External ear normal.     Left  Ear: External ear normal.     Nose: Nose normal.     Mouth/Throat:     Mouth: Mucous membranes are moist.     Pharynx: Oropharynx is clear.  Eyes:     General: No scleral icterus.       Right eye: No discharge.        Left eye: No discharge.     Extraocular Movements: Extraocular movements intact.     Conjunctiva/sclera: Conjunctivae normal.     Pupils: Pupils are equal, round, and reactive to light.  Cardiovascular:     Rate and Rhythm: Normal rate and regular rhythm.     Pulses: Normal pulses.     Heart sounds: Normal heart sounds. No murmur. No friction rub. No gallop.   Pulmonary:     Effort: Pulmonary effort is normal. No respiratory distress.     Breath sounds: Normal breath sounds. No stridor. No wheezing, rhonchi or rales.  Chest:     Chest wall: No tenderness.  Musculoskeletal:        General: Normal range of motion.     Cervical back: Normal range of motion and neck supple.  Skin:    General: Skin is warm and dry.     Capillary Refill: Capillary refill takes less than 2 seconds.     Coloration: Skin is not jaundiced or pale.     Findings: No bruising, erythema, lesion or rash.  Neurological:     General: No focal deficit present.     Mental Status: She is alert and oriented to person, place, and time. Mental status is at baseline.  Psychiatric:        Mood and Affect: Mood normal.        Behavior: Behavior normal.        Thought Content: Thought content normal.        Judgment: Judgment normal.     Results for orders placed or performed during the hospital encounter of [id number]  Basic metabolic panel  Result Value Ref Range   Sodium 137 135 - 145 mmol/L   Potassium 3.9 3.5 - 5.1 mmol/L   Chloride 104 98 - 111 mmol/L   CO2 23 22 - 32 mmol/L   Glucose, Bld 175 (H) 70 - 99 mg/dL   BUN 8 6 - 20 mg/dL   Creatinine, Ser 0.75 0.44 - 1.00 mg/dL   Calcium 8.7 (L) 8.9 - 10.3 mg/dL   GFR calc non Af Amer >60 >60 mL/min   GFR calc Af Amer >60 >60 mL/min   Anion gap  10 5 - 15  CBC  Result Value Ref Range   WBC 8.7 4.0 - 10.5 K/uL   RBC 4.54 3.87 - 5.11 MIL/uL   Hemoglobin 14.4 12.0 - 15.0 g/dL   HCT 43.5 36.0 - 46.0 %   MCV 95.8 80.0 - 100.0 fL   MCH 31.7 26.0 - 34.0 pg   MCHC 33.1 30.0 -  36.0 g/dL   RDW 13.3 11.5 - 15.5 %   Platelets 195 150 - 400 K/uL   nRBC 0.0 0.0 - 0.2 %  Pregnancy, urine POC  Result Value Ref Range   Preg Test, Ur NEGATIVE NEGATIVE  Troponin I (High Sensitivity)  Result Value Ref Range   Troponin I (High Sensitivity) 3 <18 ng/L  Troponin I (High Sensitivity)  Result Value Ref Range   Troponin I (High Sensitivity) 3 <18 ng/L      Assessment & Plan:   Problem List Items Addressed This Visit      Cardiovascular and Mediastinum   Essential hypertension - Primary    Under good control on current regimen. Continue current regimen. Continue to monitor. Call with any concerns. Refills given. Labs drawn today.       Relevant Medications   losartan (COZAAR) 50 MG tablet   Other Relevant Orders   Basic metabolic panel     Other   GAD (generalized anxiety disorder)    Working with psychiatry. To have follow up but none booked- appointment confirmed with psychiatry and patient alerted. Continue to follow with psychiatry. Call with any concerns.           Follow up plan: Return in about 6 months (around 08/05/2019) for Physical with PCP.

## 2019-02-07 LAB — BASIC METABOLIC PANEL
BUN/Creatinine Ratio: 8 — ABNORMAL LOW (ref 9–23)
BUN: 8 mg/dL (ref 6–20)
CO2: 20 mmol/L (ref 20–29)
Calcium: 9.4 mg/dL (ref 8.7–10.2)
Chloride: 105 mmol/L (ref 96–106)
Creatinine, Ser: 0.99 mg/dL (ref 0.57–1.00)
GFR calc Af Amer: 88 mL/min/{1.73_m2} (ref >59)
GFR calc non Af Amer: 76 mL/min/{1.73_m2} (ref >59)
Glucose: 80 mg/dL (ref 65–99)
Potassium: 4.5 mmol/L (ref 3.5–5.2)
Sodium: 143 mmol/L (ref 134–144)

## 2019-02-15 ENCOUNTER — Ambulatory Visit (INDEPENDENT_AMBULATORY_CARE_PROVIDER_SITE_OTHER): Payer: PRIVATE HEALTH INSURANCE | Admitting: Licensed Clinical Social Worker

## 2019-02-15 ENCOUNTER — Other Ambulatory Visit: Payer: Self-pay

## 2019-02-15 DIAGNOSIS — F3162 Bipolar disorder, current episode mixed, moderate: Secondary | ICD-10-CM

## 2019-02-15 DIAGNOSIS — F411 Generalized anxiety disorder: Secondary | ICD-10-CM

## 2019-02-15 NOTE — Progress Notes (Signed)
Virtual Visit via Telephone Note   I connected with Carrie Burke on 02/15/19 at 3:00pm by telephone and verified that I am speaking with the correct person using two identifiers.   I discussed the limitations, risks, security and privacy concerns of performing an evaluation and management service by telephone and the availability of in person appointments. I also discussed with the patient that there may be a patient responsible charge related to this service. The patient expressed understanding and agreed to proceed.   I discussed the assessment and treatment plan with the patient. The patient was provided an opportunity to ask questions and all were answered. The patient agreed with the plan and demonstrated an understanding of the instructions.   The patient was advised to call back or seek an in-person evaluation if the symptoms worsen or if the condition fails to improve as anticipated.   I provided 45 minutes of non-face-to-face time during this encounter.     [name], LCSW, LCASA _______________________ THERAPIST PROGRESS NOTE  Session Time: 3:00pm - 3:45pm  Participation Level: Active  Behavioral Response: Alert, anxious mood  Type of Therapy:  Individual Therapy  Treatment Goals addressed: Self-care routine; Medication Compliance; Anxiety management; Exercise; Employment   Interventions: CBT, mindfulness   Summary: Carrie Burke presented for telephone session today due to inability to access Webex.  She spoke in a manner that was alert, oriented x5, with no evidence or self-report of SI/HI or A/V H.  Carrie Burke reported that she is taking medications as prescribed.  Carrie Burke reported scores of 5/10 for depression and 10/10 for anxiety today.  Carrie Burke reported that she has been trying to be productive in the past week by getting a new phone and car, but anxiety continues to be a problem, and is most prominent in large crowds of people, as well as when expectations are high at work.   Carrie Burke reported that she gets increased heart rate, shaky, sweaty, and racing thoughts, and this happened several times today due to project deadlines.  Carrie Burke reported that she attempts to cope in these moments by engaging in deep breathing and positive self talk statements such as "You've got this" and "Its okay", but they are only temporarily helpful.  Carrie Burke was agreeable to trying relaxation techniques today in session.  Carrie Burke participated in both exercises and noted that the mindful breathing exercise dropped her severity to 6/10 and made her more calm and relaxed.  Carrie Burke reported that the grounding technique required more focus than she expected, and she believes this will be helpful in distraction when she has racing thoughts.  Carrie Burke reported that she would try practicing these often and report back in 1 week.    Suicidal/Homicidal: None, without plan or intent.    Therapist Response: Carrie Burke spoke with Carrie Burke today via telephone due to her connectivity issues.  Carrie Burke inquired about safety and medication compliance.  Carrie Burke inquired about Carrie Burke's emotional ratings at this time, along with any significant changes in thoughts, feelings, or behavior since last session.  Carrie Burke inquired about whether Carrie Burke had identified any common triggers to her anxiety, and what physiological signs indicated that it was becoming overwhelming.  Carrie Burke inquired about her current coping skills to manage anxiety.  Carrie Burke offered to teach new techniques in session today.  Carrie Burke first presented mindful breathing exercise involving getting comfortable, finding a relaxed breathing pattern and focusing on this rhythm, allowing thoughts and feelings to arise and pass without ruminating on them to successfully maintain present mindedness.  Carrie Burke also  explained 5-4-3-2-1 grounding technique involving using all 5 senses to help her stay calm in stressful situations by listing 5 things she could  see, 4 she could touch, 3 she could hear, 2 she could smell, and 1 she could taste.  Carrie Burke inquired about effectiveness of exercises afterward and encouraged daily practice to aid in anxiety reduction.  Carrie Burke will continue to monitor.    Plan: Follow up again in 1 week.   Diagnosis: Generalized Anxiety Disorder; Bipolar I Disorder, Mixed moderate  [name], [place], [place] 02/15/19

## 2019-02-16 ENCOUNTER — Ambulatory Visit (INDEPENDENT_AMBULATORY_CARE_PROVIDER_SITE_OTHER): Payer: Self-pay | Admitting: Psychiatry

## 2019-02-16 ENCOUNTER — Encounter: Payer: Self-pay | Admitting: Psychiatry

## 2019-02-16 ENCOUNTER — Other Ambulatory Visit: Payer: Self-pay

## 2019-02-16 DIAGNOSIS — F17200 Nicotine dependence, unspecified, uncomplicated: Secondary | ICD-10-CM

## 2019-02-16 DIAGNOSIS — F1011 Alcohol abuse, in remission: Secondary | ICD-10-CM

## 2019-02-16 DIAGNOSIS — F410 Panic disorder [episodic paroxysmal anxiety] without agoraphobia: Secondary | ICD-10-CM

## 2019-02-16 DIAGNOSIS — G4700 Insomnia, unspecified: Secondary | ICD-10-CM

## 2019-02-16 DIAGNOSIS — F3177 Bipolar disorder, in partial remission, most recent episode mixed: Secondary | ICD-10-CM

## 2019-02-16 DIAGNOSIS — F411 Generalized anxiety disorder: Secondary | ICD-10-CM

## 2019-02-16 MED ORDER — PROPRANOLOL HCL 10 MG PO TABS: 10.0000 mg | ORAL_TABLET | Freq: Two times a day (BID) | ORAL | 1 refills | Status: DC | PRN

## 2019-02-16 MED ORDER — MIRTAZAPINE 7.5 MG PO TABS: 7.5000 mg | ORAL_TABLET | Freq: Every day | ORAL | 1 refills | Status: DC

## 2019-02-16 NOTE — Progress Notes (Signed)
Provider Location : [facility] Patient Location : Garment/textile technologist Visit via Video Note  I connected with Carrie Burke on 02/16/19 at  9:30 AM EST by a video enabled telemedicine application and verified that I am speaking with the correct person using two identifiers.   I discussed the limitations of evaluation and management by telemedicine and the availability of in person appointments. The patient expressed understanding and agreed to proceed.     I discussed the assessment and treatment plan with the patient. The patient was provided an opportunity to ask questions and all were answered. The patient agreed with the plan and demonstrated an understanding of the instructions.   The patient was advised to call back or seek an in-person evaluation if the symptoms worsen or if the condition fails to improve as anticipated.   [facility] MD OP Progress Note  02/16/2019 9:59 AM Carrie Burke  MRN:  [id number]  Chief Complaint:  Chief Complaint    Follow-up     HPI: Cyerra is a 29 year old Caucasian female, married, employed, lives in [place], has a history of bipolar disorder, GAD, chronic migraine, hypertension, history of lipoma, alcohol abuse, tobacco use disorder was evaluated by telemedicine today.  Patient today reports that she is currently making progress on the current medication regimen with regards to her mood symptoms.  She denies any significant mood swings.  She denies being hypomanic, manic or depressed.  She is tolerating the Abilify well and reports good as effective.  Patient reports she continues to have panic attacks on and off.  She reports the last couple of days she had panic attacks in social situations.  She reports she felt sweaty, and had shortness of breath and felt extremely nervous.  She was able to breathe however she reports she may have done it the wrong way and started hyperventilating.  She had an appointment with her therapist yesterday and she reports  she got some new techniques which she is going to practice.  She reports she may have taken the lorazepam 4 times since we last spoke 2 weeks ago.  She reports she will continue to limit use.  She is aware about the side effects and adverse effects of lorazepam.  Patient reports sleep continues to be restless.  She reports she wakes up several times at night.  She does not know why she does that.  She has tried trazodone and melatonin in the past which did not help.  Discussed Remeron.  She has never tried it before.  Patient denies any suicidality, homicidality or perceptual disturbances.  Patient denies any alcohol abuse at this time, she does have a history of it in the past.  Patient denies any other concerns today.   Visit Diagnosis:    ICD-10-CM   1. Bipolar disorder, in partial remission, most recent episode mixed (HCC)  F31.77   2. GAD (generalized anxiety disorder)  F41.1 mirtazapine (REMERON) 7.5 MG tablet  3. Panic attacks  F41.0 propranolol (INDERAL) 10 MG tablet    mirtazapine (REMERON) 7.5 MG tablet  4. Insomnia, unspecified type  G47.00   5. Tobacco use disorder  F17.200   6. History of alcohol abuse  F10.11     Past Psychiatric History: Reviewed past psychiatric history from my progress note on 06/10/2018.  Past Medical History:  Past Medical History:  Diagnosis Date  . Anxiety   . Bipolar disorder ([facility])   . Common migraine with intractable migraine 03/18/2017  . Depression   .  Elevated serum glutamic pyruvic transaminase (SGPT) level   . Gait abnormality 03/18/2017  . Gestational diabetes   . High cholesterol   . High-risk pregnancy 01/24/2014  . History of pre-eclampsia   . History of pre-eclampsia in prior pregnancy, currently pregnant in third trimester 01/24/2014  . Polyhydramnios in third trimester 02/11/2014  . Preeclampsia   . Vitamin D deficiency disease     Past Surgical History:  Procedure Laterality Date  . CLAVICLE SURGERY  2014  . TUBAL LIGATION       Family Psychiatric History: Reviewed family psychiatric history from my progress note on 06/10/2018.  Family History:  Family History  Problem Relation Age of Onset  . Arthritis Mother   . Breast cancer Mother 73  . Depression Mother   . Anxiety disorder Mother   . Heart disease Father   . Hyperlipidemia Father   . Hypertension Father   . Asthma Brother   . Breast cancer Maternal Grandmother   . Breast cancer Paternal Grandmother   . Bipolar disorder Paternal Uncle     Social History: Reviewed social history from my progress note on 06/10/2018. Social History   Socioeconomic History  . Marital status: Married    Spouse name: jame  . Number of children: 3  . Years of education: Not on file  . Highest education level: High school graduate  Occupational History  . Not on file  Tobacco Use  . Smoking status: Current Every Day Smoker    Packs/day: 1.00    Types: Cigarettes  . Smokeless tobacco: Never Used  Substance and Sexual Activity  . Alcohol use: Yes    Comment: Occasionally  . Drug use: No  . Sexual activity: Yes    Birth control/protection: Surgical  Other Topics Concern  . Not on file  Social History Narrative   Lives with husband and kids   Caffeine use: coffee daily   Right handed    Social Determinants of Health   Financial Resource Strain: Low Risk   . Difficulty of Paying Living Expenses: Not hard at all  Food Insecurity: No Food Insecurity  . Worried About Charity fundraiser in the Last Year: Never true  . Ran Out of Food in the Last Year: Never true  Transportation Needs: No Transportation Needs  . Lack of Transportation (Medical): No  . Lack of Transportation (Non-Medical): No  Physical Activity: Inactive  . Days of Exercise per Week: 0 days  . Minutes of Exercise per Session: 0 min  Stress: Stress Concern Present  . Feeling of Stress : Very much  Social Connections: Unknown  . Frequency of Communication with Friends and Family: Not on  file  . Frequency of Social Gatherings with Friends and Family: Not on file  . Attends Religious Services: Never  . Active Member of Clubs or Organizations: No  . Attends Archivist Meetings: Never  . Marital Status: Married    Allergies: No Known Allergies  Metabolic Disorder Labs: Lab Results  Component Value Date   HGBA1C 5.5 01/18/2019   No results found for: PROLACTIN Lab Results  Component Value Date   CHOL 268 (H) 01/18/2019   TRIG 635 (HH) 01/18/2019   HDL 43 01/18/2019   LDLCALC 113 (H) 01/18/2019   LDLCALC 102 (H) 02/27/2015   Lab Results  Component Value Date   TSH 3.250 01/18/2019   TSH 2.570 09/04/2017    Therapeutic Level Labs: No results found for: LITHIUM No results found for: VALPROATE No  components found for:  CBMZ  Current Medications: Current Outpatient Medications  Medication Sig Dispense Refill  . ARIPiprazole (ABILIFY) 5 MG tablet Take 1 tablet (5 mg total) by mouth daily. 30 tablet 3  . baclofen (LIORESAL) 10 MG tablet Take 0.5 tablets (5 mg total) by mouth 2 (two) times daily. 90 each 3  . cloNIDine (CATAPRES) 0.1 MG tablet Take 0.1 mg by mouth daily as needed.    Marland Kitchen escitalopram (LEXAPRO) 5 MG tablet Take 1 tablet (5 mg total) by mouth daily with supper. 30 tablet 1  . LORazepam (ATIVAN) 0.5 MG tablet Take 1 tablet (0.5 mg total) by mouth as directed. Take one tablet up to 2-3 times a week only for severe panic attacks 10 tablet 0  . losartan (COZAAR) 50 MG tablet Take 1 tablet (50 mg total) by mouth daily. 90 tablet 1  . mirtazapine (REMERON) 7.5 MG tablet Take 1 tablet (7.5 mg total) by mouth at bedtime. For sleep 30 tablet 1  . propranolol (INDERAL) 10 MG tablet Take 1 tablet (10 mg total) by mouth 2 (two) times daily as needed. As needed for severe anxiety attacks 60 tablet 1  . rizatriptan (MAXALT) 10 MG tablet Take 1 tablet (10 mg total) by mouth 3 (three) times daily as needed for migraine. 10 tablet 3   No current  facility-administered medications for this visit.     Musculoskeletal: Strength & Muscle Tone: UTA Gait & Station: normal Patient leans: N/A  Psychiatric Specialty Exam: Review of Systems  Psychiatric/Behavioral: Positive for sleep disturbance. The patient is nervous/anxious.   All other systems reviewed and are negative.   There were no vitals taken for this visit.There is no height or weight on file to calculate BMI.  General Appearance: Casual  Eye Contact:  Fair  Speech:  Clear and Coherent  Volume:  Normal  Mood:  Anxious  Affect:  Congruent  Thought Process:  Goal Directed and Descriptions of Associations: Intact  Orientation:  Full (Time, Place, and Person)  Thought Content: Logical   Suicidal Thoughts:  No  Homicidal Thoughts:  No  Memory:  Immediate;   Fair Recent;   Fair Remote;   Fair  Judgement:  Fair  Insight:  Fair  Psychomotor Activity:  Normal  Concentration:  Concentration: Fair and Attention Span: Fair  Recall:  [employer] of Knowledge: Fair  Language: Fair  Akathisia:  No  Handed:  Right  AIMS (if indicated): Denies tremors, rigidity  Assets:  Communication Skills Desire for Improvement Housing Social Support  ADL's:  Intact  Cognition: WNL  Sleep:  Poor   Screenings: GAD-7     Office Visit from 02/05/2019 in [facility] Visit from 01/18/2019 in [facility] Visit from 11/30/2018 in [facility] Visit from 05/08/2018 in [facility] Visit from 04/03/2018 in [facility]  Total GAD-7 Score  11  13  18  5  4     PHQ2-9     Office Visit from 02/05/2019 in [facility] Visit from 01/18/2019 in [facility] Visit from 11/30/2018 in [facility] Visit from 05/08/2018 in [facility] Visit from 04/03/2018 in [facility]  PHQ-2 Total Score  2  2  4  2   0  PHQ-9 Total Score  11  12  17  9   5        Assessment and Plan: Shonelle is a 30 year old Caucasian female,  employed, lives in [place], married, has a history of migraine headaches, hypertension, lipoma, bipolar disorder, generalized anxiety disorder, panic attacks, tobacco use disorder was evaluated by telemedicine today.  She is biologically predisposed given her family history of mental health problems as well as her own medical issues.  Patient with psychosocial stressors of the current pandemic as well as her own health issues.  Patient continues to struggle with panic attacks and sleep problems although making some progress.  Plan as noted below.  Plan Bipolar disorder-in partial remission Abilify 5 mg p.o. daily  GAD-some progress Lexapro 5 mg p.o. daily with supper Remeron will also help.  Panic attacks-some progress Lexapro 5 mg p.o. daily with supper Continue CBT with Mr. Chryl Heck her therapist. Discussed referral for IOP or PHP if she continues to struggle. Patient advised to limit the use of lorazepam-0.5 mg-she takes it 1-2 times a week only. Add propranolol 10 mg p.o. twice daily as needed for severe panic attacks.  Insomnia-unstable Add Remeron 7.5 mg p.o. nightly   Tobacco use disorder -unstable Provided smoking cessation counseling  History of alcohol abuse-episodic-in remission Patient currently denies alcohol abuse/binging.  Follow-up in clinic in 2 to 3 weeks or sooner if needed.  February 22 at 10:40 AM  I have spent atleast 20 minutes non face to face with patient today. More than 50 % of the time was spent for ordering medications and test ,psychoeducation and supportive psychotherapy and care coordination,as well as documenting clinical information in electronic health record. This note was generated in part or whole with voice recognition software. Voice recognition is usually quite accurate but there are transcription errors that can and very often do occur. I apologize for any  typographical errors that were not detected and corrected.            Ursula Alert, MD 02/16/2019, 9:59 AM

## 2019-02-17 ENCOUNTER — Ambulatory Visit: Payer: Self-pay | Admitting: Psychiatry

## 2019-02-22 ENCOUNTER — Other Ambulatory Visit: Payer: Self-pay

## 2019-02-22 ENCOUNTER — Ambulatory Visit (INDEPENDENT_AMBULATORY_CARE_PROVIDER_SITE_OTHER): Payer: PRIVATE HEALTH INSURANCE | Admitting: Licensed Clinical Social Worker

## 2019-02-22 DIAGNOSIS — F3162 Bipolar disorder, current episode mixed, moderate: Secondary | ICD-10-CM

## 2019-02-22 DIAGNOSIS — F411 Generalized anxiety disorder: Secondary | ICD-10-CM | POA: Diagnosis not present

## 2019-02-22 NOTE — Progress Notes (Signed)
Virtual Visit via Telephone Note  I connected withTiffany Burke 2/8/21at 3:00pmby telephone and verified that I am speaking with the correct person using two identifiers.  I discussed the limitations, risks, security and privacy concerns of performing an evaluation and management service by telephone and the availability of in person appointments. I also discussed with the patient that there may be a patient responsible charge related to this service. The patient expressed understanding and agreed to proceed.  I discussed the assessment and treatment plan with the patient. The patient was provided an opportunity to ask questions and all were answered. The patient agreed with the plan and demonstrated an understanding of the instructions.  The patient was advised to call back or seek an in-person evaluation if the symptoms worsen or if the condition fails to improve as anticipated.  I provided1 hour  of non-face-to-face time during this encounter.   Carrie Flood, LCSW, LCASA _______________________ THERAPIST PROGRESS NOTE  Session Time: 3:00pm - 4:00pm  Participation Level: Active  Behavioral Response: Alert, anxious mood  Type of Therapy:  Individual Therapy  Treatment Goals addressed: Self-care routine; Medication Compliance; Anxiety management; Exercise; Employment; Communication in relationship   Interventions: CBT  Summary: Carrie Burke presented for telephone session today due to inability to access Webex.  Carrie Burke spoke in a manner that was alert, oriented x5, with no evidence or self-report of SI/HI or A/V H.  Carrie Burke reported that she continues taking medications as prescribed and upon speaking with psychiatrist the other day, received two new ones, one of which she hopes will improve her sleep.  Carrie Burke reported scores of 4/10 for depression and 5/10 for anxiety today, stating "I've been not too bad, not too good".  She reported progress on following up with  psychiatrist, listening to music for self-care after work, following a low cholesterol diet more closely, and seeing slightly more consistent sleep.  Carrie Burke reported that her biggest challenge at this time is her husband, whose behavior has been increasingly stressing her out, since he bottles up his feelings and has been worried about his health, as well as his step-father's for some time now. Carrie Burke reported that when he comes home, he tends to be in a bad mood and 'runs his mouth' at her, which leads to arguing. Carrie Burke stated "I try to use the breathing like we talking about and it works sometimes.  I just have to walk away other times".  Carrie Burke reported that it was not always this way, and 6 years ago they were involved in marriage counseling for 1 month, which resulted in 'tremendous change'.  Carrie Burke reported that her husband was open to the mutual counselor at that time and 'got all his skeletons out of the closet', but now he is resistant to taking off work to do this again, and she feels that he will eventually 'blow up'.  Carrie Burke reported that her husband did let down his guard a few months ago and cry after talking about stressors he is experiencing, and she let him know she is there for him, but he has not known this left of intimacy since then.  Carrie Burke reported that she would not give up on him, and would follow up in 1 week.     Suicidal/Homicidal: None, without plan or intent.     Therapist Response: Carrie Burke spoke with Carrie Burke today via telephone due to ongoing inability to access Webex.  Carrie Burke inquired about safety and compliance with medication. Carrie Burke inquired about Carrie Burke's emotional ratings today, along with  any significant changes in thoughts, feelings, or behavior since previous conversation.  Carrie Burke inquired about progress Carrie Burke has made towards goals, and current challenges.  Carrie Burke praised Carrie Burke for making some progress and encouraged her to take time each  evening to explore new self-care activities to add to routine.  Carrie Burke inquired about how Carrie Burke has been attempting to cope with relationship stressors at this time, and praised her for effective use of relaxation techniques to help deescalate.  Carrie Burke inquired about patterns of communication which have been present in relationship, and how they have attempted to resolve this in the past.  Carrie Burke empathized with Carrie Burke regarding her efforts to engage with her husband in meaningful conversation and encouraged her to continue trying to find appropriate, emotionally balanced moments to speak with him about this behavior so that they can work towards strengthening relationship to increase sense of support and calm at home.  Carrie Burke will continue to monitor.    Plan: Follow up again in 1 week.   Diagnosis: Generalized Anxiety Disorder; Bipolar I Disorder, Mixed moderate  Carrie Burke, [place], LCAS 02/22/19

## 2019-03-01 ENCOUNTER — Other Ambulatory Visit: Payer: Self-pay

## 2019-03-01 ENCOUNTER — Ambulatory Visit (INDEPENDENT_AMBULATORY_CARE_PROVIDER_SITE_OTHER): Payer: PRIVATE HEALTH INSURANCE | Admitting: Licensed Clinical Social Worker

## 2019-03-01 DIAGNOSIS — F3162 Bipolar disorder, current episode mixed, moderate: Secondary | ICD-10-CM | POA: Diagnosis not present

## 2019-03-01 DIAGNOSIS — F411 Generalized anxiety disorder: Secondary | ICD-10-CM | POA: Diagnosis not present

## 2019-03-01 NOTE — Progress Notes (Signed)
Virtual Visit via Telephone Note  I connected withTiffany Kerron2/15/21at3:00pmbytelephoneand verified that I am speaking with the correct person using two identifiers.  I discussed the limitations, risks, security and privacy concerns of performing an evaluation and management service by telephone and the availability of in person appointments. I also discussed with the patient that there may be a patient responsible charge related to this service. The patient expressed understanding and agreed to proceed.  I discussed the assessment and treatment plan with the patient. The patient was provided an opportunity to ask questions and all were answered. The patient agreed with the plan and demonstrated an understanding of the instructions.  The patient was advised to call back or seek an in-person evaluation if the symptoms worsen or if the condition fails to improve as anticipated.  I provided1 hour of non-face-to-face time during this encounter.   [name], LCSW, LCAS _______________________ THERAPIST PROGRESS NOTE  Session Time:3:00pm - 4:00pm  Participation Level:Active  Behavioral Response:Alert, anxious mood  Type of Therapy: Individual Therapy  Treatment Goals addressed:Self-care routine; Medication Compliance; Anxiety management; Exercise; Employment; Communication in relationship  Interventions:CBT, progressive muscle relaxation, affirmations   Summary: Carrie Burke presented for telephone session today due to inability to access Webex.  Carrie Burke spoke in a manner that was alert, oriented x5, with no evidence or self-report of SI/HI or A/V H.  [name] reported that she has been taking medications as prescribed.  Carrie Burke reported scores of 7/10 for depression and 9/10 for anxiety today, stating "I have no idea why I feel down, but I know I'm anxious because of work".  Carrie Burke reported that she has been making progress with using relaxation techniques each  day, setting aside around 1.5 hours of self-care time daily after work shifts, getting in 30 minutes of exercise over the weekend, continuing to work on getting 8 hours rest each night, and speaking with her boyfriend about their communication issues, which resulted in him making greater effort to set aside time each day to open up about their days and express feelings without distraction.  She stated "It feels good to be able to get stuff off my chest finally".  Carrie Burke reported that her problems began a few days ago when her child became sick, leading her to work from home and when she returned to work today, her boss was 'snappy' at her, and informed Carrie Burke that an order that she had been working on had been cancelled.  Carrie Burke reported that she went on break to breathe and calm down following her boss's attitude, but when she learned about the project cancellation it upset her greatly, and she asked herself why she had been working so hard on this for a week, which led to feelings of anger, frustration, confusion, and anxiety. Carrie Burke reported that structure is very important to her and she does not respond well when things are out of her control.  Carrie Burke was receptive to thought replacement, using affirmations such as "It will be okay", "Its out of my hands", and "I can adapt to anything" in conjunction with deep breathing.  Carrie Burke reported that this work stress had also given her a tension headache, and she was agreeable to trying relaxation technique today.  Carrie Burke participated in exercise and reported that she did feel less anxiety afterward, and more relaxed.  She reported that she no longer had a headache, and also recognized that her neck and calves were particularly tense.  She reported that she would consider practicing this on work breaks  to stay calm during shifts.  Carrie Burke reported that she would follow up in 1 week.     Suicidal/Homicidal: None, without plan or intent.     Therapist  Response: Clinician spoke with Carrie Burke today via telephone due to ongoing inability to access Webex.  Clinician inquired about safety and compliance with medication. Clinician inquired about Carrie Burke's current emotional ratings, along with any significant changes in thoughts, feelings, or behavior since last session.  Clinician inquired about progress Carrie Burke has made towards goals in past week, as well as any barriers she is facing.  Clinician praised Carrie Burke for her progress and encouraged her to continue vocalizing concerns with boyfriend to maintain open communication in relationship for support.  Clinician inquired about what specific aspects of work had influenced her anxiety today, and how she attempted to cope with this.  Clinician utilized a cognitive behavioral approach to assist [name] in examining how unexpected changes at work could influence her negative thinking, then lead to negative feelings as well, reinforcing symptoms of depression and anxiety.  Clinician assisted [name] in replacing negative thoughts with positive affirmations to help her cope with challenges more effectively.  Clinician also offered to demonstrate progressive muscle relaxation with [name] today to help her identify areas of tension in the body related to stress, and try to alleviate this discomfort.  Clinician guided [name] through process of tensing muscles in the body sequentially and then releasing them after 5 seconds, as well as mentally telling herself "Relax" upon release to enhance effect.  Clinician inquired afterward about benefits of exercise, and encouraged [name] to practice often.  Clinician will continue to monitor.    Plan:Follow up again in 1 week.   Diagnosis:Generalized Anxiety Disorder; Bipolar I Disorder, Mixed moderate  [name], [place], LCAS 03/01/19

## 2019-03-08 ENCOUNTER — Encounter: Payer: Self-pay | Admitting: Psychiatry

## 2019-03-08 ENCOUNTER — Ambulatory Visit (INDEPENDENT_AMBULATORY_CARE_PROVIDER_SITE_OTHER): Payer: Self-pay | Admitting: Psychiatry

## 2019-03-08 ENCOUNTER — Other Ambulatory Visit: Payer: Self-pay

## 2019-03-08 DIAGNOSIS — F17200 Nicotine dependence, unspecified, uncomplicated: Secondary | ICD-10-CM

## 2019-03-08 DIAGNOSIS — F1011 Alcohol abuse, in remission: Secondary | ICD-10-CM

## 2019-03-08 DIAGNOSIS — G4700 Insomnia, unspecified: Secondary | ICD-10-CM

## 2019-03-08 DIAGNOSIS — F410 Panic disorder [episodic paroxysmal anxiety] without agoraphobia: Secondary | ICD-10-CM

## 2019-03-08 DIAGNOSIS — F411 Generalized anxiety disorder: Secondary | ICD-10-CM

## 2019-03-08 DIAGNOSIS — F3177 Bipolar disorder, in partial remission, most recent episode mixed: Secondary | ICD-10-CM

## 2019-03-08 MED ORDER — ESCITALOPRAM OXALATE 5 MG PO TABS: 5.0000 mg | ORAL_TABLET | Freq: Every day | ORAL | 1 refills | Status: DC

## 2019-03-08 NOTE — Progress Notes (Signed)
Provider Location : [facility] Patient Location : Work   Virtual Visit via Telephone Note  I connected with Carrie Burke on 03/08/19 at 10:40 AM EST by telephone and verified that I am speaking with the correct person using two identifiers.   I discussed the limitations, risks, security and privacy concerns of performing an evaluation and management service by telephone and the availability of in person appointments. I also discussed with the patient that there may be a patient responsible charge related to this service. The patient expressed understanding and agreed to proceed.    I discussed the assessment and treatment plan with the patient. The patient was provided an opportunity to ask questions and all were answered. The patient agreed with the plan and demonstrated an understanding of the instructions.   The patient was advised to call back or seek an in-person evaluation if the symptoms worsen or if the condition fails to improve as anticipated.   [place] MD OP Progress Note  03/08/2019 11:59 AM Carrie Burke  MRN:  [id number]  Chief Complaint:  Chief Complaint    Follow-up     HPI: Carrie Burke is a 29 year old Caucasian female, married, employed, lives in [place], has a history of bipolar disorder, GAD, chronic migraine, hypertension, history of lipoma, alcohol abuse, tobacco use disorder was evaluated by phone today.  Patient preferred to do a phone call.  Patient today reports she is currently doing well on the current medication regimen.  She is tolerating the medications well.  Denies side effects.  She reports sleep is improved on the mirtazapine.  Patient reports she is currently following up with her therapist for her panic symptoms and they are beneficial.  She reports work is going well.  Patient denies any suicidality, homicidality or perceptual disturbances.  Patient reports she continues to smoke cigarettes and is currently trying to cut back.  She  denies any other concerns today. Visit Diagnosis:    ICD-10-CM   1. Bipolar disorder, in partial remission, most recent episode mixed (HCC)  F31.77   2. GAD (generalized anxiety disorder)  F41.1 escitalopram (LEXAPRO) 5 MG tablet  3. Panic attacks  F41.0 escitalopram (LEXAPRO) 5 MG tablet  4. Insomnia, unspecified type  G47.00   5. Tobacco use disorder  F17.200   6. History of alcohol abuse  F10.11     Past Psychiatric History: I have reviewed past psychiatric history from my progress note on 06/10/2018.  Past Medical History:  Past Medical History:  Diagnosis Date  . Anxiety   . Bipolar disorder ([place])   . Common migraine with intractable migraine 03/18/2017  . Depression   . Elevated serum glutamic pyruvic transaminase (SGPT) level   . Gait abnormality 03/18/2017  . Gestational diabetes   . High cholesterol   . High-risk pregnancy 01/24/2014  . History of pre-eclampsia   . History of pre-eclampsia in prior pregnancy, currently pregnant in third trimester 01/24/2014  . Polyhydramnios in third trimester 02/11/2014  . Preeclampsia   . Vitamin D deficiency disease     Past Surgical History:  Procedure Laterality Date  . CLAVICLE SURGERY  2014  . TUBAL LIGATION      Family Psychiatric History: Reviewed family psychiatric history from my progress note on 06/10/2018.  Family History:  Family History  Problem Relation Age of Onset  . Arthritis Mother   . Breast cancer Mother 63  . Depression Mother   . Anxiety disorder Mother   . Heart disease Father   .  Hyperlipidemia Father   . Hypertension Father   . Asthma Brother   . Breast cancer Maternal Grandmother   . Breast cancer Paternal Grandmother   . Bipolar disorder Paternal Uncle     Social History: Reviewed social history from my progress note on 06/10/2018. Social History   Socioeconomic History  . Marital status: Married    Spouse name: jame  . Number of children: 3  . Years of education: Not on file  . Highest  education level: High school graduate  Occupational History  . Not on file  Tobacco Use  . Smoking status: Current Every Day Smoker    Packs/day: 1.00    Types: Cigarettes  . Smokeless tobacco: Never Used  Substance and Sexual Activity  . Alcohol use: Yes    Comment: Occasionally  . Drug use: No  . Sexual activity: Yes    Birth control/protection: Surgical  Other Topics Concern  . Not on file  Social History Narrative   Lives with husband and kids   Caffeine use: coffee daily   Right handed    Social Determinants of Health   Financial Resource Strain: Low Risk   . Difficulty of Paying Living Expenses: Not hard at all  Food Insecurity: No Food Insecurity  . Worried About Charity fundraiser in the Last Year: Never true  . Ran Out of Food in the Last Year: Never true  Transportation Needs: No Transportation Needs  . Lack of Transportation (Medical): No  . Lack of Transportation (Non-Medical): No  Physical Activity: Inactive  . Days of Exercise per Week: 0 days  . Minutes of Exercise per Session: 0 min  Stress: Stress Concern Present  . Feeling of Stress : Very much  Social Connections: Unknown  . Frequency of Communication with Friends and Family: Not on file  . Frequency of Social Gatherings with Friends and Family: Not on file  . Attends Religious Services: Never  . Active Member of Clubs or Organizations: No  . Attends Archivist Meetings: Never  . Marital Status: Married    Allergies: No Known Allergies  Metabolic Disorder Labs: Lab Results  Component Value Date   HGBA1C 5.5 01/18/2019   No results found for: PROLACTIN Lab Results  Component Value Date   CHOL 268 (H) 01/18/2019   TRIG 635 (HH) 01/18/2019   HDL 43 01/18/2019   LDLCALC 113 (H) 01/18/2019   LDLCALC 102 (H) 02/27/2015   Lab Results  Component Value Date   TSH 3.250 01/18/2019   TSH 2.570 09/04/2017    Therapeutic Level Labs: No results found for: LITHIUM No results found  for: VALPROATE No components found for:  CBMZ  Current Medications: Current Outpatient Medications  Medication Sig Dispense Refill  . ARIPiprazole (ABILIFY) 5 MG tablet Take 1 tablet (5 mg total) by mouth daily. 30 tablet 3  . baclofen (LIORESAL) 10 MG tablet Take 0.5 tablets (5 mg total) by mouth 2 (two) times daily. 90 each 3  . cloNIDine (CATAPRES) 0.1 MG tablet Take 0.1 mg by mouth daily as needed.    Marland Kitchen escitalopram (LEXAPRO) 5 MG tablet Take 1 tablet (5 mg total) by mouth daily with supper. 30 tablet 1  . LORazepam (ATIVAN) 0.5 MG tablet Take 1 tablet (0.5 mg total) by mouth as directed. Take one tablet up to 2-3 times a week only for severe panic attacks 10 tablet 0  . losartan (COZAAR) 50 MG tablet Take 1 tablet (50 mg total) by mouth daily. [place]  tablet 1  . mirtazapine (REMERON) 7.5 MG tablet Take 1 tablet (7.5 mg total) by mouth at bedtime. For sleep 30 tablet 1  . propranolol (INDERAL) 10 MG tablet Take 1 tablet (10 mg total) by mouth 2 (two) times daily as needed. As needed for severe anxiety attacks 60 tablet 1  . rizatriptan (MAXALT) 10 MG tablet Take 1 tablet (10 mg total) by mouth 3 (three) times daily as needed for migraine. 10 tablet 3   No current facility-administered medications for this visit.     Musculoskeletal: Strength & Muscle Tone: UTA Gait & Station: Reports as WNL Patient leans: N/A  Psychiatric Specialty Exam: Review of Systems  Respiratory: Positive for cough.   Psychiatric/Behavioral: Negative for agitation, behavioral problems, confusion, decreased concentration, dysphoric mood, hallucinations, sleep disturbance and suicidal ideas. The patient is nervous/anxious. The patient is not hyperactive.   All other systems reviewed and are negative.   There were no vitals taken for this visit.There is no height or weight on file to calculate BMI.  General Appearance: UTA  Eye Contact:  UTA  Speech:  Clear and Coherent  Volume:  Normal  Mood:  Anxious   Affect:  UTA  Thought Process:  Goal Directed and Descriptions of Associations: Intact  Orientation:  Full (Time, Place, and Person)  Thought Content: Logical   Suicidal Thoughts:  No  Homicidal Thoughts:  No  Memory:  Immediate;   Fair Recent;   Fair Remote;   Fair  Judgement:  Fair  Insight:  Fair  Psychomotor Activity:  UTA  Concentration:  Concentration: Fair and Attention Span: Fair  Recall:  [employer] of Knowledge: Fair  Language: Fair  Akathisia:  No  Handed:  Right  AIMS (if indicated): UTA  Assets:  Communication Skills Desire for Improvement Housing Social Support  ADL's:  Intact  Cognition: WNL  Sleep:  Good   Screenings: GAD-7     Office Visit from 02/05/2019 in [facility] Visit from 01/18/2019 in [facility] Office Visit from 11/30/2018 in [facility] Visit from 05/08/2018 in [facility] Visit from 04/03/2018 in [facility]  Total GAD-7 Score  11  13  18  5  4     PHQ2-9     Office Visit from 02/05/2019 in [facility] Visit from 01/18/2019 in [facility] Visit from 11/30/2018 in [facility] Visit from 05/08/2018 in [facility] Visit from 04/03/2018 in [facility]  PHQ-2 Total Score  2  2  4  2   0  PHQ-9 Total Score  11  12  17  9  5        Assessment and Plan: Marya is a 29 year old Caucasian female, employed, lives in [place], married, has a history of migraine headaches, hypertension, lipoma, bipolar disorder, GAD, panic attacks, tobacco use disorder was evaluated by telemedicine today.  She is biologically predisposed given her family history of mental health problems, her own medical issues.  Patient with psychosocial stressors of current pandemic as well as health issues.  She is currently making progress on the current medication regimen and will continue to benefit from psychotherapy  sessions.  Plan as noted below.  Plan Bipolar disorder in partial remission Abilify 5 mg p.o. daily  GAD-improving Lexapro 5 mg p.o. daily with supper Remeron 7.5 mg p.o. nightly  Panic attacks-improving Lexapro 5 mg p.o. daily with supper Continue CBT with Mr. Chryl Heck.  Patient was offered IOP or PHP in the past-pending Patient advised to limit the use of lorazepam 0.5 mg which he takes 1-2 times a week only. Propranolol 10 mg p.o. twice daily as needed for severe panic symptoms  Insomnia-improving Remeron 7.5 mg p.o. nightly  Tobacco use disorder-unstable Provided smoking cessation counseling  Follow-up in clinic in 1 month or sooner if needed.  March 25 at 1130  I have spent atleast 20 minutes non face to face with patient today. More than 50 % of the time was spent for ordering medications and test ,psychoeducation and supportive psychotherapy and care coordination,as well as documenting clinical information in electronic health record. This note was generated in part or whole with voice recognition software. Voice recognition is usually quite accurate but there are transcription errors that can and very often do occur. I apologize for any typographical errors that were not detected and corrected.     Ursula Alert, MD 03/08/2019, 11:59 AM

## 2019-03-15 ENCOUNTER — Ambulatory Visit (INDEPENDENT_AMBULATORY_CARE_PROVIDER_SITE_OTHER): Payer: PRIVATE HEALTH INSURANCE | Admitting: Licensed Clinical Social Worker

## 2019-03-15 ENCOUNTER — Telehealth: Payer: Self-pay

## 2019-03-15 ENCOUNTER — Other Ambulatory Visit: Payer: Self-pay

## 2019-03-15 DIAGNOSIS — F3162 Bipolar disorder, current episode mixed, moderate: Secondary | ICD-10-CM

## 2019-03-15 DIAGNOSIS — F411 Generalized anxiety disorder: Secondary | ICD-10-CM

## 2019-03-15 NOTE — Progress Notes (Signed)
Virtual Visit via Telephone Note  I connected withTiffany Kerron3/1/21at3:00pmbytelephoneand verified that I am speaking with the correct person using two identifiers.  I discussed the limitations, risks, security and privacy concerns of performing an evaluation and management service by telephone and the availability of in person appointments. I also discussed with the patient that there may be a patient responsible charge related to this service. The patient expressed understanding and agreed to proceed.  I discussed the assessment and treatment plan with the patient. The patient was provided an opportunity to ask questions and all were answered. The patient agreed with the plan and demonstrated an understanding of the instructions.  The patient was advised to call back or seek an in-person evaluation if the symptoms worsen or if the condition fails to improve as anticipated.  I provided 45 minutes of non-face-to-face time during this encounter.   [name], LCSW, LCAS _______________________ THERAPIST PROGRESS NOTE  Session Time:3:00pm -3:45pm  Participation Level:Active  Behavioral Response:Alert, combined anxious and depressed mood  Type of Therapy: Individual Therapy  Treatment Goals addressed:Self-care routine; Medication Compliance; Anxiety management; Exercise/diet; Employment; Communication in relationship  Interventions:CBT, family systems therapy   Summary: Carrie Burke is a 29 year old female that presented for telephone therapy session today with Generalized Anxiety Disorder and Bipolar I Disorder, mixed, moderate.    Suicidal/Homicidal: None; without plan or intent.   Therapist Response: Clinician spoke with Carrie Burke today via telephone due to her preference for this mode of communication rather than Webex. Clinician assessed for safety and ongoing compliance and effectiveness of medication. Clinician inquired about Carrie Burke emotional  ratings at this time, as well as any significant changes in thoughts, feelings, or behavior since previous conversation. Carrie Burke presented as alert, oriented x5, with no evidence or self-report of SI/HI or A/V H.  She reported compliance with current medication, but does not feel it has been as effective in previous weeks due to increased depression and anxiety, which she rated as 9/10 and 12/10 respectively.  Carrie Burke stated "I don't know why, but I've felt horrible lately", and reported that she does plan to speak with her psychiatrist this week if symptoms do not alleviate in following days.  Clinician inquired about any significant changes or notable events in past week which might have preceded this mood shift.  Carrie Burke initially denied anything that stood out, but then noted that she spoke with her partner about her childhood in past week, which had brought up difficult memories from her childhood and made her think about relationships within the family.  Clinician discussed elements of family systems theory with Carrie Burke today, including the various roles that family members can assume, including the common traits of the hero/heroine, mascot, lost child, and scapegoat.  Clinician inquired about what role Carrie Burke and the other siblings played, and how this influenced their development as children and adults.  Carrie Burke reported that she had two brothers, one of which was the "Hero that could do no wrong" and the other which was the mascot and tried to resolve tension in the family and maintain a positive image.  Carrie Burke reported that she ended up as the scapegoat and was blamed for anything that went wrong, which made her very critical of herself growing up, and is one reason she can have a negative self image.  Carrie Burke reported that the other brother that served as the mascot eventually "Carrie Burke his own way" and now lives away from the family and is not in contact as much, so she  feels like establishing similar  boundaries could be helpful in protecting her own mental health.  Clinician worked with Carrie Burke to identify recurrent negative thoughts linked to her upraising as the scapegoat and replace these with positive affirmations to help lift mood, such as "I am good enough", "I am a good mother and provider", and "I don't need my family's approval".  Clinician inquired about additional progress Carrie Burke has made towards goals in last week. Carrie Burke reported that she has continued using relaxation techniques daily to alleviate anxiety and lift mood, spending 2 hours at a minimum each day for interaction with children and partner, walking for exercise 10-15 minutes each day while following diet, and sleeping at least 8 hours nightly.  Clinician will continue to monitor.   Plan:Follow up again in 1 week.   Diagnosis:Generalized Anxiety Disorder; Bipolar I Disorder, Mixed moderate  [name], [place], LCAS 03/15/19

## 2019-03-15 NOTE — Telephone Encounter (Signed)
Medication management - Patient left a message stating she would like to discuss a possible medication adjustment with Dr. Shea Evans.  Stated she returns later this month but would like to discuss medications with provider prior to then.

## 2019-03-16 ENCOUNTER — Telehealth: Payer: Self-pay

## 2019-03-16 DIAGNOSIS — F3162 Bipolar disorder, current episode mixed, moderate: Secondary | ICD-10-CM

## 2019-03-16 DIAGNOSIS — F411 Generalized anxiety disorder: Secondary | ICD-10-CM

## 2019-03-16 MED ORDER — ESCITALOPRAM OXALATE 10 MG PO TABS: 10.0000 mg | ORAL_TABLET | Freq: Every day | ORAL | 1 refills | Status: DC

## 2019-03-16 MED ORDER — ARIPIPRAZOLE 10 MG PO TABS: 10.0000 mg | ORAL_TABLET | Freq: Every day | ORAL | 1 refills | Status: DC

## 2019-03-16 NOTE — Telephone Encounter (Signed)
Returned call to patient, patient reports she had a discussion with her husband about her past and that has triggered a lot of memories and it is making her anxious and depressed.  Discussed IOP referral-she is not interested right now.  Patient denies any suicidality or homicidality.  Patient is interested in dosage increase of Abilify as well as Lexapro.  Will increase Abilify to 10 mg and Lexapro to 10 mg p.o. daily.

## 2019-03-16 NOTE — Telephone Encounter (Signed)
Patient called and stated that her anxiety and depression are through the roof. Please review and advise. Thank you.

## 2019-03-22 ENCOUNTER — Other Ambulatory Visit: Payer: Self-pay

## 2019-03-22 ENCOUNTER — Ambulatory Visit (INDEPENDENT_AMBULATORY_CARE_PROVIDER_SITE_OTHER): Payer: PRIVATE HEALTH INSURANCE | Admitting: Licensed Clinical Social Worker

## 2019-03-22 DIAGNOSIS — F3162 Bipolar disorder, current episode mixed, moderate: Secondary | ICD-10-CM

## 2019-03-22 DIAGNOSIS — F411 Generalized anxiety disorder: Secondary | ICD-10-CM

## 2019-03-22 NOTE — Progress Notes (Signed)
Virtual Visit via Telephone Note   I connected with Carrie Burke on 03/22/19 at 3:00pm by telephone and verified that I am speaking with the correct person using two identifiers.   I discussed the limitations, risks, security and privacy concerns of performing an evaluation and management service by telephone and the availability of in person appointments. I also discussed with the patient that there may be a patient responsible charge related to this service. The patient expressed understanding and agreed to proceed.   I discussed the assessment and treatment plan with the patient. The patient was provided an opportunity to ask questions and all were answered. The patient agreed with the plan and demonstrated an understanding of the instructions.   The patient was advised to call back or seek an in-person evaluation if the symptoms worsen or if the condition fails to improve as anticipated.   I provided 30 minutes of non-face-to-face time during this encounter.     [name], LCSW, LCAS _______________________ THERAPIST PROGRESS NOTE   Session Time: 3:00pm - 3:30pm   Participation Level: Active   Behavioral Response: Alert, combined anxious mood   Type of Therapy:  Individual Therapy   Treatment Goals addressed: Self-care routine; Medication Compliance; Anxiety management; Exercise/diet; Employment; Communication in relationship   Interventions: CBT, psychoeducation   Summary: Carrie Burke is a 29 year old female that presented for telephone therapy session today with Generalized Anxiety Disorder and Bipolar I Disorder, mixed, moderate.      Suicidal/Homicidal: None; without plan or intent.     Therapist Response: Clinician spoke with Carrie Burke today via telephone due to her preference for this mode of communication instead of Webex.  Clinician assessed for safety and medication compliance. Carrie Burke presented alert, oriented x5, with no evidence or self-report of SI/HI or A/V H.   Carrie Burke reported ongoing compliance with medication and stated "I did end up contacting my psychiatrist and they increased both of my meds".  She reported that this seemed to have helped, stating "My mood is 10 times better".  Clinician inquired about Carrie Burke's current emotional ratings, as well as any significant changes in thoughts, feelings, or behavior since last session.  Carrie Burke reported scores of 4/10 for depression and 5/10 for anxiety, stating "Today has gone pretty well, so I'm feeling better.  I think its because things are going as planned at my job".  Clinician inquired about progress towards goals made in past week, as well as present barriers.  Carrie Burke reported that she has been using relaxation techniques every other day to reduce stress, exercising for 10-15 minutes 3-4 times per week to improve health and wellness, continued normal work schedule, and is getting roughly 8 hours of sleep nightly.  She also reported that things are going better with her husband, as they aren't arguing anymore, have communicated more often, and she plans on treating him to a nice dinner for his birthday to show appreciation for recent progress.  Carrie Burke reported that she would like to better understand her depression in this session due to unpredictability of symptoms and recent breakdown at work.  Clinician provided her with psychoeducation on the cycle of depression.  Clinician explained how stressors are people, places, or things which can influence depression, and inquired about Carrie Burke's most present ones.  Carrie Burke reported that hers include her job, husband, caring for her children, and extended family.  Clinician explained how stressors influence an individual's thoughts, feelings, and physical symptoms day to day, and inquired about what changes Carrie Burke has  noticed during periods of increased stress.  Carrie Burke reported that her thoughts have included "I'm not perfect" and "I'm not good enough".  She reported  that these thoughts make her feel sad, hopeless, and angry at times, which can lead to physical signs like fatigue, decreased concentration, and sleep issues.  Clinician then explained importance of behavioral response, and how the way a person responds to their thoughts, feelings, and symptoms can either reduce impact of stressors, or potentially create new ones (I.e. relying on substance use to numb oneself).  Carrie Burke reported that she has drank in the past as an outlet, but is trying to explore healthier alternatives such as communicating about stress with her husband for support, exercising to vent stress, writing, and cooking.  Carrie Burke reported that this discussion was helpful for putting things into perspective and she would take this into consideration with reinforcing self-care routine moving forward.  Clinician will continue to monitor.     Plan: Follow up again in 1 week.   Diagnosis: Generalized Anxiety Disorder; Bipolar I Disorder, Mixed moderate   [name], [place], LCAS 03/22/19

## 2019-03-27 ENCOUNTER — Other Ambulatory Visit: Payer: Self-pay | Admitting: Psychiatry

## 2019-03-27 DIAGNOSIS — F411 Generalized anxiety disorder: Secondary | ICD-10-CM

## 2019-03-27 DIAGNOSIS — F410 Panic disorder [episodic paroxysmal anxiety] without agoraphobia: Secondary | ICD-10-CM

## 2019-03-29 ENCOUNTER — Ambulatory Visit (INDEPENDENT_AMBULATORY_CARE_PROVIDER_SITE_OTHER): Payer: PRIVATE HEALTH INSURANCE | Admitting: Licensed Clinical Social Worker

## 2019-03-29 ENCOUNTER — Other Ambulatory Visit: Payer: Self-pay

## 2019-03-29 DIAGNOSIS — F3162 Bipolar disorder, current episode mixed, moderate: Secondary | ICD-10-CM

## 2019-03-29 DIAGNOSIS — F411 Generalized anxiety disorder: Secondary | ICD-10-CM | POA: Diagnosis not present

## 2019-03-29 NOTE — Progress Notes (Signed)
Virtual Visit via Telephone Note   I connected with Carrie Burke on 03/29/19 at 3:00pm by telephone and verified that I am speaking with the correct person using two identifiers.   I discussed the limitations, risks, security and privacy concerns of performing an evaluation and management service by telephone and the availability of in person appointments. I also discussed with the patient that there may be a patient responsible charge related to this service. The patient expressed understanding and agreed to proceed.   I discussed the assessment and treatment plan with the patient. The patient was provided an opportunity to ask questions and all were answered. The patient agreed with the plan and demonstrated an understanding of the instructions.   The patient was advised to call back or seek an in-person evaluation if the symptoms worsen or if the condition fails to improve as anticipated.   I provided 45 minutes of non-face-to-face time during this encounter.     [name], LCSW, LCAS _______________________ THERAPIST PROGRESS NOTE   Session Time: 3:00pm - 3:45pm   Participation Level: Active   Behavioral Response: Alert, combined anxious/depressed mood   Type of Therapy:  Individual Therapy   Treatment Goals addressed: Self-care routine; Medication Compliance; Anxiety management; Exercise/diet; Employment; Communication in relationship   Interventions: CBT, anger management    Summary: Carrie Burke is a 29 year old female that presented for telephone therapy session today with Generalized Anxiety Disorder and Bipolar I Disorder, mixed, moderate.      Suicidal/Homicidal: None; without plan or intent.     Therapist Response: Clinician spoke with Aubriella today via telephone due to her preference for this mode of communication instead of Webex.  Clinician assessed for safety and compliance with medication . Deah presented alert, oriented x5, with no evidence or self-report of  SI/HI or A/V H.  Maayan reported ongoing compliance with prescriptions.  Clinician inquired about Kambri's emotional ratings today, as well as any significant changes in thoughts, feelings, or behavior since previous conversation.  Katlyn reported scores of 5-6/10 for depression and 7/10 for anxiety, stating "Work has been kinda crazy.  Friday was bad".  Veatrice reported that it seems like people at her job are on edge and making 'Orlena Sheldon' comments more often, which has gotten under her skin.  She also reported that she did not have a relaxing weekend, since both Saturday and Sunday involved long drives to a race and to pick up a car, which had her "Worried that something bad would happen".  Scotty reported that these events collectively made her more irritable.  Clinician inquired about whether Alexianna attempted to use relaxation techniques during these episodes of stress.  Naylin reported that she tried breathing and her husband also tried to distract her by talking, but she could not find relief.  Tais reported that she was still able to walk every day for exercise, but noted limited motivation and engagement towards other goals.  Blake reported that she would like to focus on anger management today given recent issues with stress, as she noticed she has been biting her tongue at work, and then venting at home, which could eventually affect the relationship with her husband.  Clinician discussed several strategies with Emmer that she could implement in the next week to aid with this goal, including identifying and monitoring for warning signs that anger could be growing; taking timeouts to calm down when symptoms appear and conflict could arise; using deep breathing to stay grounded and manage heart rate; Exercising  as an outlet for difficult feelings; Expressing anger after calming down, in an appropriate mindset and setting with affected person(s); and thinking ahead at possible consequences of  speech/actions to curb impulsivity.  Ferol reported that her warning signs include getting quiet, giving shorter responses, becoming sweaty, feeling heartbeat increase, and turning red in the face.  She noted that she could use breaks at work for timeouts and acknowledged that she does not typically walk away when conflict arises.  She noted that deep breathing can sometimes be helpful, as long as she isn't already too upset, but walking always helps clear her mind, and she could walk around outside of building where no one may be.  She reported that she would make greater effort to express anger to the person that caused feelings in the first place, such as her boss, who even came to her the other day asking what was wrong, although she would not say at the time.  Creedence reported that she would work to implement these changes this week and report back on outcome at next session. Clinician will continue to monitor.     Plan: Follow up again in 1 week.   Diagnosis: Generalized Anxiety Disorder; Bipolar I Disorder, Mixed moderate   [name], [place], LCAS 03/29/19

## 2019-04-05 ENCOUNTER — Other Ambulatory Visit: Payer: Self-pay

## 2019-04-05 ENCOUNTER — Ambulatory Visit (INDEPENDENT_AMBULATORY_CARE_PROVIDER_SITE_OTHER): Payer: PRIVATE HEALTH INSURANCE | Admitting: Licensed Clinical Social Worker

## 2019-04-05 DIAGNOSIS — F3162 Bipolar disorder, current episode mixed, moderate: Secondary | ICD-10-CM

## 2019-04-05 DIAGNOSIS — F411 Generalized anxiety disorder: Secondary | ICD-10-CM | POA: Diagnosis not present

## 2019-04-05 DIAGNOSIS — F410 Panic disorder [episodic paroxysmal anxiety] without agoraphobia: Secondary | ICD-10-CM

## 2019-04-05 NOTE — Progress Notes (Signed)
Virtual Visit via Telephone Note   I connected with Carrie Burke on 04/05/19 at 3:00pm by telephone and verified that I am speaking with the correct person using two identifiers.   I discussed the limitations, risks, security and privacy concerns of performing an evaluation and management service by telephone and the availability of in person appointments. I also discussed with the patient that there may be a patient responsible charge related to this service. The patient expressed understanding and agreed to proceed.   I discussed the assessment and treatment plan with the patient. The patient was provided an opportunity to ask questions and all were answered. The patient agreed with the plan and demonstrated an understanding of the instructions.   The patient was advised to call back or seek an in-person evaluation if the symptoms worsen or if the condition fails to improve as anticipated.   I provided 45 minutes of non-face-to-face time during this encounter.     [name], LCSW, LCAS _______________________ THERAPIST PROGRESS NOTE   Session Time: 3:00pm - 3:45pm   Participation Level: Active   Behavioral Response: Alert, anxious mood   Type of Therapy:  Individual Therapy   Treatment Goals addressed: Self-care routine; Medication Compliance; Anxiety management; Exercise; Employment; Communication in relationship   Interventions: CBT, coping skills for panic attacks in public     Summary: Carrie Burke is a 29 year old female that presented for telephone therapy session today with Generalized Anxiety Disorder and Bipolar I Disorder, mixed, moderate.      Suicidal/Homicidal: None; without plan or intent.     Therapist Response: Clinician spoke with Carrie Burke today via telephone due to her preference for this mode of communication instead of Webex.  Clinician assessed for safety, sobriety, and compliance with medication . Carrie Burke presented on time and spoke in a manner that was alert,  oriented x5, with no evidence or self-report of SI/HI or A/V H.  Carrie Burke reported that she is taking medication as prescribed and denied any use of alcohol or illicit substances. Clinician inquired about Lacye's current emotional ratings, as well as any significant changes in thoughts, feelings, or behavior since last session.  Carrie Burke reported scores of 5/10 for depression and 6/10 for anxiety, stating "I was a little overwhelmed earlier, but felt better after I got everything done".  Clinician inquired about progress Carrie Burke has made towards goals in past week, as well as any challenges she is facing at this time.  Carrie Burke reported that she did end up speaking with her boss at work about stressors, and they have come to a mutual understanding, which has helped her avoid leaving early again.  Carrie Burke also reported that she is getting in 1-2 hours of self-care daily, including listening to podcasts now to distract herself.  She reported that she is exercising daily by walking and her sleep seems to be more consistent. Carrie Burke reported that she is practicing relaxation skills like mindful breathing and progressive muscle relaxation regularly, and relied upon this heavily over the weekend when she and her husband went to a racetrack in [place], where she felt overwhelmed around the crowd of people, and ended up having two panic attacks in short succession.  Carrie Burke reported that she experienced a similar crisis while shopping the next day.  Clinician inquired about what physical signs Carrie Burke notices when she begins experiencing an attack, as well as any recurrent thoughts which could be influencing panic state.  Carrie Burke reported that she begins to sweat, breath rapidly, her heartbeat increases,  and she can shake at times, and one thought which entered her mind was "I gotta get out of here".  Clinician discussed various coping skills that Carrie Burke could try implementing ahead of trip next week to [facility] raceway,  including use of an alternative deep breathing technique involving breathing in for 4 seconds, pausing for 4 seconds, and exhaling over 6 seconds; attempting to replace irrational thoughts with more rational, optimistic ones when feeling panic; bringing a friend, family member, or other close support with you to stressful scenarios to assist with mood regulation; and closing one's eyes to imagine positive visualizations in the moment to calm down.  Carrie Burke expressed receptiveness to all of the offered techniques and reported that she would try these today upon visiting the grocery store, since she considers this to be a less stressful public setting.  Carrie Burke also reported that she would speak with her husband about these techniques so that he can assist appropriately if she begins to feel panicked while in public.  Clinician encouraged Amalie to pace herself during this process, noting that desensitization to triggering scenarios takes times and ranking anxiety towards various public scenarios could be beneficial in planning ahead.  Clinician will continue to monitor.     Plan: Follow up again in 1 week.   Diagnosis: Generalized Anxiety Disorder; Bipolar I Disorder, Mixed moderate   [name], [place], LCAS 04/05/19

## 2019-04-08 ENCOUNTER — Encounter: Payer: Self-pay | Admitting: Psychiatry

## 2019-04-08 ENCOUNTER — Other Ambulatory Visit: Payer: Self-pay

## 2019-04-08 ENCOUNTER — Ambulatory Visit (INDEPENDENT_AMBULATORY_CARE_PROVIDER_SITE_OTHER): Payer: Self-pay | Admitting: Psychiatry

## 2019-04-08 DIAGNOSIS — F17200 Nicotine dependence, unspecified, uncomplicated: Secondary | ICD-10-CM

## 2019-04-08 DIAGNOSIS — F1011 Alcohol abuse, in remission: Secondary | ICD-10-CM

## 2019-04-08 DIAGNOSIS — F411 Generalized anxiety disorder: Secondary | ICD-10-CM

## 2019-04-08 DIAGNOSIS — F410 Panic disorder [episodic paroxysmal anxiety] without agoraphobia: Secondary | ICD-10-CM

## 2019-04-08 DIAGNOSIS — G4700 Insomnia, unspecified: Secondary | ICD-10-CM

## 2019-04-08 DIAGNOSIS — F3177 Bipolar disorder, in partial remission, most recent episode mixed: Secondary | ICD-10-CM

## 2019-04-08 MED ORDER — MIRTAZAPINE 7.5 MG PO TABS: 7.5000 mg | ORAL_TABLET | Freq: Every day | ORAL | 1 refills | Status: DC

## 2019-04-08 MED ORDER — LORAZEPAM 0.5 MG PO TABS: 0.5000 mg | ORAL_TABLET | ORAL | 0 refills | Status: DC

## 2019-04-08 MED ORDER — PROPRANOLOL HCL 10 MG PO TABS: 10.0000 mg | ORAL_TABLET | Freq: Two times a day (BID) | ORAL | 1 refills | Status: DC | PRN

## 2019-04-08 NOTE — Progress Notes (Signed)
Provider Location : [facility] Patient Location : Home  Virtual Visit via Video Note  I connected with Carrie Burke on 04/08/19 at 11:30 AM EDT by a video enabled telemedicine application and verified that I am speaking with the correct person using two identifiers.   I discussed the limitations of evaluation and management by telemedicine and the availability of in person appointments. The patient expressed understanding and agreed to proceed.    I discussed the assessment and treatment plan with the patient. The patient was provided an opportunity to ask questions and all were answered. The patient agreed with the plan and demonstrated an understanding of the instructions.   The patient was advised to call back or seek an in-person evaluation if the symptoms worsen or if the condition fails to improve as anticipated.  [facility] MD OP Progress Note  04/08/2019 11:57 AM Carrie Burke  MRN:  [id number]  Chief Complaint:  Chief Complaint    Follow-up     HPI: Carrie Burke is a 29 year old Caucasian female, married, employed, lives in [place], has a history of bipolar disorder, GAD, chronic migraine, hypertension, history of lipoma, alcohol abuse, tobacco use disorder was evaluated by telemedicine today.  Patient today reports she had panic attacks at least twice.  She reports that happened while she was at a race.  She reports it lasted for 20 minutes and she had shortness of breath ,felt shaky and sweaty.  She was able to smoke a cigarette and that helped.  She has been compliant on her medications as prescribed.  She denies any significant mood swings or depressive symptoms at this time.  She reports sleep is good.  She denies any side effects to her medications.  She has been following up with her therapist on a weekly basis and has been working on Geophysicist/field seismologist .  She reports she is going on vacation tomorrow.  She looks forward to that.  She  denies any other concerns today. Visit Diagnosis:    ICD-10-CM   1. Bipolar disorder, in partial remission, most recent episode mixed (HCC)  F31.77   2. GAD (generalized anxiety disorder)  F41.1 mirtazapine (REMERON) 7.5 MG tablet  3. Panic attacks  F41.0 LORazepam (ATIVAN) 0.5 MG tablet    mirtazapine (REMERON) 7.5 MG tablet    propranolol (INDERAL) 10 MG tablet  4. Insomnia, unspecified type  G47.00   5. Tobacco use disorder  F17.200   6. History of alcohol abuse  F10.11     Past Psychiatric History: I have reviewed past psychiatric history from my progress note on 06/10/2018  Past Medical History:  Past Medical History:  Diagnosis Date  . Anxiety   . Bipolar disorder ([place])   . Common migraine with intractable migraine 03/18/2017  . Depression   . Elevated serum glutamic pyruvic transaminase (SGPT) level   . Gait abnormality 03/18/2017  . Gestational diabetes   . High cholesterol   . High-risk pregnancy 01/24/2014  . History of pre-eclampsia   . History of pre-eclampsia in prior pregnancy, currently pregnant in third trimester 01/24/2014  . Polyhydramnios in third trimester 02/11/2014  . Preeclampsia   . Vitamin D deficiency disease     Past Surgical History:  Procedure Laterality Date  . CLAVICLE SURGERY  2014  . TUBAL LIGATION      Family Psychiatric History: I have reviewed family psychiatric history from my progress note on 06/10/2018 Family History:  Family History  Problem Relation Age of Onset  .  Arthritis Mother   . Breast cancer Mother 71  . Depression Mother   . Anxiety disorder Mother   . Heart disease Father   . Hyperlipidemia Father   . Hypertension Father   . Asthma Brother   . Breast cancer Maternal Grandmother   . Breast cancer Paternal Grandmother   . Bipolar disorder Paternal Uncle     Social History: I have reviewed social history from my progress note on 06/10/2018 Social History   Socioeconomic History  . Marital status: Married    Spouse  name: jame  . Number of children: 3  . Years of education: Not on file  . Highest education level: High school graduate  Occupational History  . Not on file  Tobacco Use  . Smoking status: Current Every Day Smoker    Packs/day: 1.00    Types: Cigarettes  . Smokeless tobacco: Never Used  Substance and Sexual Activity  . Alcohol use: Yes    Comment: Occasionally  . Drug use: No  . Sexual activity: Yes    Birth control/protection: Surgical  Other Topics Concern  . Not on file  Social History Narrative   Lives with husband and kids   Caffeine use: coffee daily   Right handed    Social Determinants of Health   Financial Resource Strain: Low Risk   . Difficulty of Paying Living Expenses: Not hard at all  Food Insecurity: No Food Insecurity  . Worried About Charity fundraiser in the Last Year: Never true  . Ran Out of Food in the Last Year: Never true  Transportation Needs: No Transportation Needs  . Lack of Transportation (Medical): No  . Lack of Transportation (Non-Medical): No  Physical Activity: Inactive  . Days of Exercise per Week: 0 days  . Minutes of Exercise per Session: 0 min  Stress: Stress Concern Present  . Feeling of Stress : Very much  Social Connections: Unknown  . Frequency of Communication with Friends and Family: Not on file  . Frequency of Social Gatherings with Friends and Family: Not on file  . Attends Religious Services: Never  . Active Member of Clubs or Organizations: No  . Attends Archivist Meetings: Never  . Marital Status: Married    Allergies: No Known Allergies  Metabolic Disorder Labs: Lab Results  Component Value Date   HGBA1C 5.5 01/18/2019   No results found for: PROLACTIN Lab Results  Component Value Date   CHOL 268 (H) 01/18/2019   TRIG 635 (HH) 01/18/2019   HDL 43 01/18/2019   LDLCALC 113 (H) 01/18/2019   LDLCALC 102 (H) 02/27/2015   Lab Results  Component Value Date   TSH 3.250 01/18/2019   TSH 2.570  09/04/2017    Therapeutic Level Labs: No results found for: LITHIUM No results found for: VALPROATE No components found for:  CBMZ  Current Medications: Current Outpatient Medications  Medication Sig Dispense Refill  . ARIPiprazole (ABILIFY) 10 MG tablet Take 1 tablet (10 mg total) by mouth daily. 30 tablet 1  . baclofen (LIORESAL) 10 MG tablet Take 0.5 tablets (5 mg total) by mouth 2 (two) times daily. 90 each 3  . cloNIDine (CATAPRES) 0.1 MG tablet Take 0.1 mg by mouth daily as needed.    Marland Kitchen escitalopram (LEXAPRO) 10 MG tablet Take 1 tablet (10 mg total) by mouth daily. 30 tablet 1  . LORazepam (ATIVAN) 0.5 MG tablet Take 1 tablet (0.5 mg total) by mouth as directed. Take one tablet up to 2-3  times a week only for severe panic attacks 8 tablet 0  . losartan (COZAAR) 50 MG tablet Take 1 tablet (50 mg total) by mouth daily. 90 tablet 1  . mirtazapine (REMERON) 7.5 MG tablet Take 1 tablet (7.5 mg total) by mouth at bedtime. For sleep 30 tablet 1  . propranolol (INDERAL) 10 MG tablet Take 1 tablet (10 mg total) by mouth 2 (two) times daily as needed. As needed for severe anxiety attacks 60 tablet 1  . rizatriptan (MAXALT) 10 MG tablet Take 1 tablet (10 mg total) by mouth 3 (three) times daily as needed for migraine. 10 tablet 3   No current facility-administered medications for this visit.     Musculoskeletal: Strength & Muscle Tone: UTA Gait & Station: normal Patient leans: N/A  Psychiatric Specialty Exam: Review of Systems  Psychiatric/Behavioral: The patient is nervous/anxious.   All other systems reviewed and are negative.   There were no vitals taken for this visit.There is no height or weight on file to calculate BMI.  General Appearance: Casual  Eye Contact:  Fair  Speech:  Normal Rate  Volume:  Normal  Mood:  Anxious  Affect:  Congruent  Thought Process:  Goal Directed and Descriptions of Associations: Intact  Orientation:  Full (Time, Place, and Person)  Thought  Content: Logical   Suicidal Thoughts:  No  Homicidal Thoughts:  No  Memory:  Immediate;   Fair Recent;   Fair Remote;   Fair  Judgement:  Fair  Insight:  Fair  Psychomotor Activity:  Normal  Concentration:  Concentration: Fair and Attention Span: Fair  Recall:  [employer] of Knowledge: Fair  Language: Fair  Akathisia:  No  Handed:  Right  AIMS (if indicated): UTA  Assets:  Communication Skills Desire for Improvement Housing Social Support  ADL's:  Intact  Cognition: WNL  Sleep:  Fair   Screenings: GAD-7     Office Visit from 02/05/2019 in [facility] Visit from 01/18/2019 in [facility] Visit from 11/30/2018 in [facility] Visit from 05/08/2018 in [facility] Visit from 04/03/2018 in [facility]  Total GAD-7 Score  11  13  18  5  4     PHQ2-9     Office Visit from 02/05/2019 in [facility] Visit from 01/18/2019 in [facility] Visit from 11/30/2018 in [facility] Visit from 05/08/2018 in [facility] Visit from 04/03/2018 in [facility]  PHQ-2 Total Score  2  2  4  2   0  PHQ-9 Total Score  11  12  17  9  5        Assessment and Plan: Carrie Burke is a 29 year old Caucasian female, employed, lives in [place], married, has a history of migraine headaches, hypertension, lipoma, bipolar disorder, GAD, panic attacks, tobacco use disorder was evaluated by telemedicine today.  Patient is biologically predisposed given her family history of mental health problems, her own medical issues.  Patient with psychosocial stressors of the current pandemic as well as health issues.  She is currently making progress although she continues to struggle with panic symptoms.  Plan as noted below.  Plan Bipolar disorder in partial remission Abilify 10 mg p.o. daily  GAD-improving Lexapro 10 mg p.o. daily with supper 7.5 mg p.o.  nightly Propranolol 10 mg p.o. twice daily as needed.  Panic attacks-improving Continue panic focused therapy with her therapist Mr. Chryl Heck a weekly  basis. Continue Ativan 0.5 mg up to 1-2 times a week as needed for severe panic symptoms only.  Patient advised to limit use. I have reviewed [place] controlled substance database.  Insomnia-stable Remeron 7.5 mg p.o. nightly  Tobacco use disorder-improving Provided smoking cessation counseling. Discussed just.  Follow-up in clinic in 4 weeks or sooner if needed.  I have spent atleast 20 minutes non face to face with patient today. More than 50 % of the time was spent for preparing to see the patient ( e.g., review of test, records ), obtaining and to review and separately obtained history , ordering medications and test ,psychoeducation and supportive psychotherapy and care coordination,as well as documenting clinical information in electronic health record. This note was generated in part or whole with voice recognition software. Voice recognition is usually quite accurate but there are transcription errors that can and very often do occur. I apologize for any typographical errors that were not detected and corrected.       Ursula Alert, MD 04/08/2019, 11:57 AM

## 2019-04-15 ENCOUNTER — Ambulatory Visit (INDEPENDENT_AMBULATORY_CARE_PROVIDER_SITE_OTHER): Payer: PRIVATE HEALTH INSURANCE | Admitting: Licensed Clinical Social Worker

## 2019-04-15 ENCOUNTER — Other Ambulatory Visit: Payer: Self-pay

## 2019-04-15 DIAGNOSIS — F411 Generalized anxiety disorder: Secondary | ICD-10-CM

## 2019-04-15 DIAGNOSIS — F3162 Bipolar disorder, current episode mixed, moderate: Secondary | ICD-10-CM

## 2019-04-15 NOTE — Progress Notes (Signed)
Virtual Visit via Telephone Note   I connected with Carrie Burke on 04/15/19 at 3:00pm by telephone and verified that I am speaking with the correct person using two identifiers.   I discussed the limitations, risks, security and privacy concerns of performing an evaluation and management service by telephone and the availability of in person appointments. I also discussed with the patient that there may be a patient responsible charge related to this service. The patient expressed understanding and agreed to proceed.   I discussed the assessment and treatment plan with the patient. The patient was provided an opportunity to ask questions and all were answered. The patient agreed with the plan and demonstrated an understanding of the instructions.   The patient was advised to call back or seek an in-person evaluation if the symptoms worsen or if the condition fails to improve as anticipated.   I provided 45 minutes of non-face-to-face time during this encounter.     [name], LCSW, LCAS _______________________ THERAPIST PROGRESS NOTE   Session Time: 3:00pm - 3:45pm   Participation Level: Active   Behavioral Response: Alert, anxious mood   Type of Therapy:  Individual Therapy   Treatment Goals addressed: Self-care routine; Medication Compliance; Anxiety management; Exercise; Employment; Communication in relationship   Interventions: CBT, grounding skills     Summary: Carrie Burke is a 29 year old female that presented for telephone therapy session today with Generalized Anxiety Disorder and Bipolar I Disorder, mixed, moderate.      Suicidal/Homicidal: None; without plan or intent.     Therapist Response: Clinician spoke with Carrie Burke today via telephone due to her preference for this mode of communication instead of Webex.  Clinician assessed for safety, sobriety, and compliance with medication . Carrie Burke presented on time for session and spoke in a manner that was alert, oriented x5,  with no evidence or self-report of SI/HI or A/V H.  Carrie Burke reported that she continues to take medication as prescribed and denied use of alcohol or illicit substances since last conversation. Clinician inquired about Carrie Burke's emotional ratings today, as well as any significant changes in thoughts, feelings, or behavior since previous meeting.  Carrie Burke reported scores of 5-6/10 for depression and 8/10 for anxiety, stating "Work has had me a little stressed this week".  Clinician inquired about progress Carrie Burke has made in past week towards goals, as well as any present barriers to success.  Carrie Burke reported that she has been coping at work by coordinating with her boss and using breathing to manage stress, as well as using humor among colleagues to lighten the mood.  She also reported that she continues listening to a podcast she enjoys in free time for self-care, and is getting regular walking for exercise each day.  Carrie Burke reported that she was also able to use skills presented in previous session to cope [place], noting that by discussing a plan with her husband beforehand, she was able to avoid having any panic attacks.  Carrie Burke stated "I kept breathing, talking to my husband, and he reassured me the whole time".  Clinician praised Carrie Burke for her success and inquired about whether she was able to apply skills to other settings as well, such as visiting grocery store, since she also mentioned this being stress-inducing task for her weekly.  Carrie Burke reported that she was able to successfully go to grocery store too, but admitted she gets 'tunnel vision' and tries to rush through process to get it over as  soon as possible, which can lead to forgetfulness.  Clinician revisited concept of grounding skills with [place] as techniques she can use to distract herself from troubling thoughts and feelings temporarily until task is completed, and also recommended making a list before her  trip so that she doesn't have to rely on memory alone and risk becoming overwhelmed.  Carrie Burke was agreeable to these suggestions and also noted that she would include positive affirmations to increase confidence.  Clinician inquired about Carrie Burke's plan this week/weekend to maintain productivity, improve mood, and reduce stress.  Carrie Burke reported that she will visit grocery store today after creating a list, go home later and straighten up the house, visit her family for easter tomorrow, and then take time over the weekend to relax and get her energy back following recent hectic schedule.  Clinician will continue to monitor.     Plan: Follow up again in 1 week.   Diagnosis: Generalized Anxiety Disorder; Bipolar I Disorder, Mixed moderate   [name], [place], LCAS 04/15/19

## 2019-04-19 ENCOUNTER — Telehealth: Payer: Self-pay | Admitting: Psychiatry

## 2019-04-19 DIAGNOSIS — F17200 Nicotine dependence, unspecified, uncomplicated: Secondary | ICD-10-CM

## 2019-04-19 MED ORDER — VARENICLINE TARTRATE 0.5 MG PO TABS: 0.5000 mg | ORAL_TABLET | Freq: Two times a day (BID) | ORAL | 1 refills | Status: DC

## 2019-04-19 NOTE — Telephone Encounter (Signed)
Returned call to patient.  Discussed Chantix.  Start Chantix 0.5 mg twice a day.

## 2019-04-23 ENCOUNTER — Ambulatory Visit: Payer: 59 | Admitting: Family Medicine

## 2019-04-26 ENCOUNTER — Ambulatory Visit (INDEPENDENT_AMBULATORY_CARE_PROVIDER_SITE_OTHER): Payer: PRIVATE HEALTH INSURANCE | Admitting: Licensed Clinical Social Worker

## 2019-04-26 ENCOUNTER — Other Ambulatory Visit: Payer: Self-pay

## 2019-04-26 DIAGNOSIS — F3162 Bipolar disorder, current episode mixed, moderate: Secondary | ICD-10-CM

## 2019-04-26 DIAGNOSIS — F411 Generalized anxiety disorder: Secondary | ICD-10-CM

## 2019-04-26 NOTE — Progress Notes (Signed)
Virtual Visit via Telephone Note   I connected with Carrie Burke on 04/26/19 at 3:00pm by telephone and verified that I am speaking with the correct person using two identifiers.   I discussed the limitations, risks, security and privacy concerns of performing an evaluation and management service by telephone and the availability of in person appointments. I also discussed with the patient that there may be a patient responsible charge related to this service. The patient expressed understanding and agreed to proceed.   I discussed the assessment and treatment plan with the patient. The patient was provided an opportunity to ask questions and all were answered. The patient agreed with the plan and demonstrated an understanding of the instructions.   The patient was advised to call back or seek an in-person evaluation if the symptoms worsen or if the condition fails to improve as anticipated.   I provided 45 minutes of non-face-to-face time during this encounter.     [name], LCSW, LCAS _______________________ THERAPIST PROGRESS NOTE   Session Time: 3:00pm - 3:45pm   Participation Level: Active   Behavioral Response: Alert, anxious mood   Type of Therapy:  Individual Therapy   Treatment Goals addressed: Self-care routine; Medication Compliance; Anxiety management; Exercise; Communication in relationship   Interventions: CBT, stages of change, cigarette/nicotine psychoeducation to promote health and wellness    Summary: Carrie Burke is a 29 year old female that presented for telephone therapy session today with Generalized Anxiety Disorder and Bipolar I Disorder, mixed, moderate.      Suicidal/Homicidal: None; without plan or intent.     Therapist Response: Clinician spoke with Carrie Burke today via telephone due to her preference for this mode of communication instead of Webex.  Clinician assessed for safety, sobriety, and compliance with medication . Carrie Burke presented on time for  virtual session and spoke in a manner that was alert, oriented x5, with no evidence or self-report of SI/HI or A/V H.  Carrie Burke reported that she has remained compliant with medication and denied use of alcohol or illicit substances.  Carrie Burke reported that she met with her psychiatrist in past week, and was started on Chantix, as she would like to quit smoking cigarettes.  Clinician inquired about Carrie Burke's current emotional ratings, as well as any significant changes in thoughts, feelings, or behavior since last conversation.  Carrie Burke reported scores of 7/10 for depression and 8/10 for anxiety, stating "I've only had 3 cigarettes today, so I'm a little on edge".  Clinician inquired about what led to Carrie Burke's recent increase in motivation to quit, and steps she has taken to reinforce commitment to change.  Carrie Burke reported that her husband is also a smoker and has health conditions, so she decided to quit with him as a show of support.  Clinician discussed stages of change model with Carrie Burke today regarding her likely transition through precontemplation, contemplation, preparation, action, maintenance, and relapse stages as she begins process of modifying this behavior to achieve abstinence from nicotine.  Carrie Burke was receptive to this, and noted that she has never tried quitting before, but has considered it, and this is the first time she has actually begun to take action.  She reported that her plan is to continue limiting herself to 3 cigarettes per day for a week, and then 1 thereafter until she is confident she can cease use entirely and maintain this new pattern.  Clinician provided psychoeducation on health risks involved with smoking to reinforce her commitment to this goal, including impact it can have  on various organs in the body, increased risk of cancers, and lung diseases.  Cadee was also receptive to this information, noting that she was aware of COPD risk, and has seen a family friend pass from  smoking related illness, but was not aware of additional dangers. Elaina reported that her biggest concern is figuring out what to do to replace her smoking, stating "I only smoke when I'm bored".  Clinician went through list of 100 coping skills/self-care activities that Konni could explore as healthy substitutes while she begins process of quitting.  Ellarae reported that some of the proposed activities seemed promising, including getting more exercise each week as health improves, writing gratitude letters, visiting online forums, doing word search/cross word puzzles, dancing, looking up recipes, biking, and doing yoga. Clinician encouraged Patrica to communicate plans with her husband so that they can continue supporting one another and strengthening relationship during her treatment.  Clinician will continue to monitor.     Plan: Follow up again in 1 week.   Diagnosis: Generalized Anxiety Disorder; Bipolar I Disorder, Mixed moderate   [name], [place], LCAS 04/26/19

## 2019-05-03 ENCOUNTER — Other Ambulatory Visit: Payer: Self-pay

## 2019-05-03 ENCOUNTER — Ambulatory Visit (INDEPENDENT_AMBULATORY_CARE_PROVIDER_SITE_OTHER): Payer: PRIVATE HEALTH INSURANCE | Admitting: Licensed Clinical Social Worker

## 2019-05-03 DIAGNOSIS — F411 Generalized anxiety disorder: Secondary | ICD-10-CM | POA: Diagnosis not present

## 2019-05-03 DIAGNOSIS — F3162 Bipolar disorder, current episode mixed, moderate: Secondary | ICD-10-CM

## 2019-05-03 NOTE — Progress Notes (Signed)
Virtual Visit via Telephone Note  I connected withTiffany Kerron4/19/21at3:00pmbytelephoneand verified that I am speaking with the correct person using two identifiers.  I discussed the limitations, risks, security and privacy concerns of performing an evaluation and management service by telephone and the availability of in person appointments. I also discussed with the patient that there may be a patient responsible charge related to this service. The patient expressed understanding and agreed to proceed.  I discussed the assessment and treatment plan with the patient. The patient was provided an opportunity to ask questions and all were answered. The patient agreed with the plan and demonstrated an understanding of the instructions.  The patient was advised to call back or seek an in-person evaluation if the symptoms worsen or if the condition fails to improve as anticipated.  I provided30 minutes of non-face-to-face time during this encounter.   [name], LCSW, LCAS _______________________ THERAPIST PROGRESS NOTE  Session Time:3:00pm -3:30pn  Participation Level:Active  Behavioral Response:Alert, anxious and depressed mood  Type of Therapy: Individual Therapy  Treatment Goals addressed:Self-care routine; Medication Compliance; Anxiety management; Exercise; Communication in relationship  Interventions:CBT, guided imagery  Summary: Carrie Burke is a 29 year old female thatpresented for telephone therapy session today with Generalized Anxiety Disorder and Bipolar I Disorder, mixed, moderate.  Suicidal/Homicidal: None;without plan or intent.   Therapist Response: Clinician spoke with Carrie Burke today via telephone due to her preference for this mode of communication instead of Webex.  Clinician assessed for safety, sobriety, and compliance with medication . [name] presented on time for telephone session and spoke in a manner that was alert,  oriented x5, with no evidence or self-report of SI/HI or A/V H.  Calla reported that she remains compliant with medication and abstained from use of alcohol or illicit substances.  Clinician inquired about Carrie Burke's emotional ratings today, as well as any significant changes in thoughts, feelings, or behavior since previous session.  Carrie Burke reported scores of 6/10 for both depression and anxiety, stating "I've been out of work the past week.  My family is all sick and we have appointments today for the kids to go to".  Carrie Burke reported that as a result, she would have to shorten the session.  Clinician empathized with Carrie Burke regarding illness, and inquired about whether she has been tested for COVID, and what progress she has been able to make in past week despite this setback. Carrie Burke reported that she has suffered from headache, diarrhea, fatigue and fever, but tests were negative for COVID so she was taken out of work until she no longer has symptoms.  Carrie Burke reported that due to her sickness, she has been sleeping much more while balancing responsibilities at home, and has tried to keep up with exercise when fatigue isn't too strong.  Carrie Burke reported that this has also set her back on curbing her smoking, and remains at 1/2 pack per day on average.  Clinician inquired about what Carrie Burke would like to do with remaining session time today following check-in.  Carrie Burke reported that she has felt on edge and 'more snappy' due to being out of work and sick, so she wished to engage in relaxation activity to calm down.  Clinician proposed guided imagery activity, and Carrie Burke was agreeable to this, asking if imagery of visiting the mountains would be feasible.  Clinician obliged, and guided Carrie Burke through process of getting comfortable, achieving relaxed breathing pattern, and then narrating peaceful walk through the mountains, including various pleasant sensory details to enhance experience.  Intervention was  effective, as evidenced by Carrie Burke participating in activity and reporting that she felt less anxious afterward, with a reduced severity level at 3/10 following conclusion of activity.  Clinician will continue to monitor.    Plan:Follow up again in 1 week.   Diagnosis:Generalized Anxiety Disorder; Bipolar I Disorder, Mixed moderate  [name], [place], LCAS 05/03/19

## 2019-05-10 ENCOUNTER — Ambulatory Visit (HOSPITAL_COMMUNITY): Payer: PRIVATE HEALTH INSURANCE | Admitting: Licensed Clinical Social Worker

## 2019-05-11 ENCOUNTER — Telehealth (INDEPENDENT_AMBULATORY_CARE_PROVIDER_SITE_OTHER): Payer: Self-pay | Admitting: Psychiatry

## 2019-05-11 ENCOUNTER — Encounter: Payer: Self-pay | Admitting: Psychiatry

## 2019-05-11 ENCOUNTER — Other Ambulatory Visit: Payer: Self-pay

## 2019-05-11 DIAGNOSIS — G4700 Insomnia, unspecified: Secondary | ICD-10-CM

## 2019-05-11 DIAGNOSIS — F3177 Bipolar disorder, in partial remission, most recent episode mixed: Secondary | ICD-10-CM

## 2019-05-11 DIAGNOSIS — F17200 Nicotine dependence, unspecified, uncomplicated: Secondary | ICD-10-CM

## 2019-05-11 DIAGNOSIS — F1011 Alcohol abuse, in remission: Secondary | ICD-10-CM

## 2019-05-11 DIAGNOSIS — F410 Panic disorder [episodic paroxysmal anxiety] without agoraphobia: Secondary | ICD-10-CM

## 2019-05-11 DIAGNOSIS — F411 Generalized anxiety disorder: Secondary | ICD-10-CM

## 2019-05-11 MED ORDER — ARIPIPRAZOLE 10 MG PO TABS: 10.0000 mg | ORAL_TABLET | Freq: Every day | ORAL | 1 refills | Status: DC

## 2019-05-11 NOTE — Progress Notes (Signed)
Provider Location : [facility] Patient Location : Work  Virtual Visit via Video Note  I connected with Carrie Burke on 05/11/19 at 11:20 AM EDT by a video enabled telemedicine application and verified that I am speaking with the correct person using two identifiers.   I discussed the limitations of evaluation and management by telemedicine and the availability of in person appointments. The patient expressed understanding and agreed to proceed.    I discussed the assessment and treatment plan with the patient. The patient was provided an opportunity to ask questions and all were answered. The patient agreed with the plan and demonstrated an understanding of the instructions.   The patient was advised to call back or seek an in-person evaluation if the symptoms worsen or if the condition fails to improve as anticipated [facility] MD OP Progress Note  05/11/2019 12:51 PM Carrie Burke  MRN:  [id number]  Chief Complaint:  Chief Complaint    Follow-up     HPI: Carrie Burke is a 29 year old Caucasian female, married, employed, lives in [place], has a history of bipolar disorder, GAD, chronic migraine, hypertension, history of lipoma, alcohol abuse, tobacco use disorder was evaluated by telemedicine today.  Patient today reports she is currently making progress on the current medication regimen.  She reports however she has episodes when she feels on edge however that is improving.  She is currently cutting back on smoking cigarettes now that she is on Chantix.  It is likely her mood symptoms are also due to her cutting back on her smoking.  She however reports overall she is making progress.  She denies any panic attacks.  She is compliant on medications.  She denies side effects.  She reports sleep is improved.  She reports she does wake up a few times at night however overall she feels well rested when she wakes up in the morning.  Patient denies any suicidality, homicidality or  perceptual disturbances.  Patient denies any other concerns today. Visit Diagnosis:    ICD-10-CM   1. Bipolar disorder, in partial remission, most recent episode mixed (HCC)  F31.77 ARIPiprazole (ABILIFY) 10 MG tablet  2. GAD (generalized anxiety disorder)  F41.1 ARIPiprazole (ABILIFY) 10 MG tablet  3. Panic attacks  F41.0   4. Insomnia, unspecified type  G47.00   5. Tobacco use disorder  F17.200   6. History of alcohol abuse  F10.11     Past Psychiatric History: I have reviewed past psychiatric history from my progress note on 06/10/2018.  Past Medical History:  Past Medical History:  Diagnosis Date  . Anxiety   . Bipolar disorder ([place])   . Common migraine with intractable migraine 03/18/2017  . Depression   . Elevated serum glutamic pyruvic transaminase (SGPT) level   . Gait abnormality 03/18/2017  . Gestational diabetes   . High cholesterol   . High-risk pregnancy 01/24/2014  . History of pre-eclampsia   . History of pre-eclampsia in prior pregnancy, currently pregnant in third trimester 01/24/2014  . Polyhydramnios in third trimester 02/11/2014  . Preeclampsia   . Vitamin D deficiency disease     Past Surgical History:  Procedure Laterality Date  . CLAVICLE SURGERY  2014  . TUBAL LIGATION      Family Psychiatric History: I have reviewed family psychiatric history from my progress note on 06/10/2018  Family History:  Family History  Problem Relation Age of Onset  . Arthritis Mother   . Breast cancer Mother 26  . Depression Mother   .  Anxiety disorder Mother   . Heart disease Father   . Hyperlipidemia Father   . Hypertension Father   . Asthma Brother   . Breast cancer Maternal Grandmother   . Breast cancer Paternal Grandmother   . Bipolar disorder Paternal Uncle     Social History: I have reviewed social history from my progress note on 06/10/2018 Social History   Socioeconomic History  . Marital status: Married    Spouse name: jame  . Number of children: 3  .  Years of education: Not on file  . Highest education level: High school graduate  Occupational History  . Not on file  Tobacco Use  . Smoking status: Current Every Day Smoker    Packs/day: 1.00    Types: Cigarettes  . Smokeless tobacco: Never Used  Substance and Sexual Activity  . Alcohol use: Yes    Comment: Occasionally  . Drug use: No  . Sexual activity: Yes    Birth control/protection: Surgical  Other Topics Concern  . Not on file  Social History Narrative   Lives with husband and kids   Caffeine use: coffee daily   Right handed    Social Determinants of Health   Financial Resource Strain: Low Risk   . Difficulty of Paying Living Expenses: Not hard at all  Food Insecurity: No Food Insecurity  . Worried About Charity fundraiser in the Last Year: Never true  . Ran Out of Food in the Last Year: Never true  Transportation Needs: No Transportation Needs  . Lack of Transportation (Medical): No  . Lack of Transportation (Non-Medical): No  Physical Activity: Inactive  . Days of Exercise per Week: 0 days  . Minutes of Exercise per Session: 0 min  Stress: Stress Concern Present  . Feeling of Stress : Very much  Social Connections: Unknown  . Frequency of Communication with Friends and Family: Not on file  . Frequency of Social Gatherings with Friends and Family: Not on file  . Attends Religious Services: Never  . Active Member of Clubs or Organizations: No  . Attends Archivist Meetings: Never  . Marital Status: Married    Allergies: No Known Allergies  Metabolic Disorder Labs: Lab Results  Component Value Date   HGBA1C 5.5 01/18/2019   No results found for: PROLACTIN Lab Results  Component Value Date   CHOL 268 (H) 01/18/2019   TRIG 635 (HH) 01/18/2019   HDL 43 01/18/2019   LDLCALC 113 (H) 01/18/2019   LDLCALC 102 (H) 02/27/2015   Lab Results  Component Value Date   TSH 3.250 01/18/2019   TSH 2.570 09/04/2017    Therapeutic Level Labs: No  results found for: LITHIUM No results found for: VALPROATE No components found for:  CBMZ  Current Medications: Current Outpatient Medications  Medication Sig Dispense Refill  . ARIPiprazole (ABILIFY) 10 MG tablet Take 1 tablet (10 mg total) by mouth daily. 30 tablet 1  . baclofen (LIORESAL) 10 MG tablet Take 0.5 tablets (5 mg total) by mouth 2 (two) times daily. 90 each 3  . cloNIDine (CATAPRES) 0.1 MG tablet Take 0.1 mg by mouth daily as needed.    Marland Kitchen escitalopram (LEXAPRO) 10 MG tablet Take 1 tablet (10 mg total) by mouth daily. 30 tablet 1  . LORazepam (ATIVAN) 0.5 MG tablet Take 1 tablet (0.5 mg total) by mouth as directed. Take one tablet up to 2-3 times a week only for severe panic attacks 8 tablet 0  . losartan (COZAAR) 50  MG tablet Take 1 tablet (50 mg total) by mouth daily. 90 tablet 1  . mirtazapine (REMERON) 7.5 MG tablet Take 1 tablet (7.5 mg total) by mouth at bedtime. For sleep 30 tablet 1  . propranolol (INDERAL) 10 MG tablet Take 1 tablet (10 mg total) by mouth 2 (two) times daily as needed. As needed for severe anxiety attacks 60 tablet 1  . rizatriptan (MAXALT) 10 MG tablet Take 1 tablet (10 mg total) by mouth 3 (three) times daily as needed for migraine. 10 tablet 3  . varenicline (CHANTIX) 0.5 MG tablet Take 1 tablet (0.5 mg total) by mouth 2 (two) times daily. 60 tablet 1   No current facility-administered medications for this visit.     Musculoskeletal: Strength & Muscle Tone: UTA Gait & Station: normal Patient leans: N/A  Psychiatric Specialty Exam: Review of Systems  Psychiatric/Behavioral: Positive for sleep disturbance (restless).       Mood swings  All other systems reviewed and are negative.   There were no vitals taken for this visit.There is no height or weight on file to calculate BMI.  General Appearance: Casual  Eye Contact:  Fair  Speech:  Clear and Coherent  Volume:  Normal  Mood:  Mood swings - imrpoving  Affect:  Appropriate  Thought  Process:  Goal Directed and Descriptions of Associations: Intact  Orientation:  Full (Time, Place, and Person)  Thought Content: Logical   Suicidal Thoughts:  No  Homicidal Thoughts:  No  Memory:  Immediate;   Fair Recent;   Fair Remote;   Fair  Judgement:  Fair  Insight:  Good  Psychomotor Activity:  Normal  Concentration:  Concentration: Fair and Attention Span: Fair  Recall:  [employer] of Knowledge: Fair  Language: Fair  Akathisia:  No  Handed:  Right  AIMS (if indicated): UTA  Assets:  Communication Skills Desire for Improvement Housing Intimacy Social Support Talents/Skills  ADL's:  Intact  Cognition: WNL  Sleep:  restless   Screenings: GAD-7     Office Visit from 02/05/2019 in [facility] Office Visit from 01/18/2019 in [facility] Office Visit from 11/30/2018 in [facility] Office Visit from 05/08/2018 in [facility] Visit from 04/03/2018 in [facility]  Total GAD-7 Score  11  13  18  5  4     PHQ2-9     Office Visit from 02/05/2019 in [facility] Visit from 01/18/2019 in [facility] Visit from 11/30/2018 in [facility] Visit from 05/08/2018 in [facility] Visit from 04/03/2018 in [facility]  PHQ-2 Total Score  2  2  4  2   0  PHQ-9 Total Score  11  12  17  9  5        Assessment and Plan: Carrie Burke is a 29 year old Caucasian female, employed, lives in [place], married, has a history of migraine headaches, hypertension, lipoma, bipolar disorder, GAD, panic attacks, tobacco use disorder was evaluated by telemedicine today.  She is biologically predisposed given her family history of mental health problems, her own medical issues.  Patient with psychosocial stressors of the current pandemic as well as health issues.  Patient is currently making progress.  Plan as noted below.  Plan Bipolar disorder in partial  remission Abilify 10 mg p.o. daily Since she does have some restlessness at night advised patient to take the Abilify earlier than bedtime.  GAD-improving Lexapro 10 mg p.o. daily  Mirtazapine 7.5 mg p.o. nightly Propranolol 10 mg p.o. twice daily as needed  Panic attacks-improving Continue panic focused therapy with therapist Mr. Chryl Heck. Ativan 0.5 mg up to 1-2 times a week as needed for severe panic symptoms only.  She will limit use  Insomnia-improving Remeron 7.5 mg p.o. nightly Discussed with patient to take the Abilify earlier , so it will not affect her sleep.  Tobacco use disorder-improving Provided counseling. Continue Chantix as prescribed.  Follow-up in clinic in 1 month or sooner if needed.  I have spent atleast 20 minutes non face to face with patient today. More than 50 % of the time was spent for preparing to see the patient ( e.g., review of test, records ),  ordering medications and test ,psychoeducation and supportive psychotherapy and care coordination,as well as documenting clinical information in electronic health record. This note was generated in part or whole with voice recognition software. Voice recognition is usually quite accurate but there are transcription errors that can and very often do occur. I apologize for any typographical errors that were not detected and corrected.       Ursula Alert, MD 05/11/2019, 12:51 PM

## 2019-05-12 ENCOUNTER — Ambulatory Visit (INDEPENDENT_AMBULATORY_CARE_PROVIDER_SITE_OTHER): Payer: PRIVATE HEALTH INSURANCE | Admitting: Licensed Clinical Social Worker

## 2019-05-12 DIAGNOSIS — F3162 Bipolar disorder, current episode mixed, moderate: Secondary | ICD-10-CM | POA: Diagnosis not present

## 2019-05-12 DIAGNOSIS — F411 Generalized anxiety disorder: Secondary | ICD-10-CM

## 2019-05-12 NOTE — Progress Notes (Addendum)
Virtual Visit via Telephone Note  I connected withTiffany Kerron4/28/21at3:00pmbytelephoneand verified that I am speaking with the correct person using two identifiers.  I discussed the limitations, risks, security and privacy concerns of performing an evaluation and management service by telephone and the availability of in person appointments. I also discussed with the patient that there may be a patient responsible charge related to this service. The patient expressed understanding and agreed to proceed.  I discussed the assessment and treatment plan with the patient. The patient was provided an opportunity to ask questions and all were answered. The patient agreed with the plan and demonstrated an understanding of the instructions.  The patient was advised to call back or seek an in-person evaluation if the symptoms worsen or if the condition fails to improve as anticipated.  I provided 45 minutes of non-face-to-face time during this encounter.   [name], LCSW, LCAS _______________________ THERAPIST PROGRESS NOTE  Session Time:3:00pm -3:45pm  Location: Patient: Patient Home Provider: Clinical Home Office  Participation Level:Active  Behavioral Response:Alert, anxious and depressed mood  Type of Therapy: Individual Therapy  Treatment Goals addressed:Medication Compliance; Psychiatry follow-up; Anxiety management; Communication in relationship; Anger management  Interventions:CBT, anger management  Summary: Carrie Burke is a 29 year old female thatpresented for telephone therapy session today with Generalized Anxiety Disorder and Bipolar I Disorder, mixed, moderate.  Suicidal/Homicidal: None;without plan or intent.   Therapist Response: Clinician spoke with Shalyn via telephone today due to her preference for this mode of communication instead of Webex.  Clinician assessed for safety, sobriety, and compliance with medication . Carrie Burke  presented on time for telephone session appointment and spoke in a manner that was alert, oriented x5, with no evidence or self-report of SI/HI or A/V H.  Carrie Burke reported ongoing compliance with medication and continued abstinence from alcohol or illicit substances.  Clinician inquired about Carrie Burke's current emotional ratings, as well as any significant changes in thoughts, feelings, or behavior since last conversation.  Carrie Burke reported scores of 3/10 for both depression and anxiety, stating "Today has been a little better for me".  Carrie Burke reported that she recently spoke with her psychiatrist, and mentioned how she has been more irritable lately, so the psychiatrist proposed splitting the dose over the course of the day to reduce risk of fatigue and improve mood stability.  Gracie reported that this irritability was her main concern today, as she wished to continue with anger management to reduce arguments with her husband and avoid incidents at her job.  Clinician discussed topic of external and internal triggers with Lolly, and inquired about which ones were present in most recent argument.  Carrie Burke reported that she was sick with a number of symptoms, her family was as well, she was dealing with withdrawals from cigarettes, and out of work at that moment.  Clinician inquired about how this impacted Carrie Burke's emotional state at the time, as well as her thoughts.  Carrie Burke reported that the argument started after her husband went out to get the car washed, which made her angry, but admitted that there were underlying feelings such as frustration, pain, anxiety, depression, and sadness.  Carrie Burke reported that it was more difficult to analyze thoughts, stating "I don't think, I just do.  It wasn't even a good reason to get upset or anything".  Clinician encouraged Carrie Burke to consider the link between her triggers influencing thoughts, feelings, and behavior.  Clinician provided suggestions for exercises she  can utilize to aid in this process, such as keeping an  anger log to document and analyze triggering events which lead to increased anger, and working to change thinking patterns by identifying automatic negative thoughts, challenging them, and practicing positive thought replacement to deescalate in more appropriate ways.  Clinician also recommended that Carrie Burke speak with her husband about previous strategies they have use as a couple to settle arguments peacefully, and consider going over these exercises together to increase insight.  Carrie Burke was receptive to suggestions, and agreed to use these handouts to process recent incidents further, as well as any that occur between next session to work towards goal of reducing outbursts and improving mood stability.  Clinician will continue to monitor.     Plan:Follow up again in 1 week.   Diagnosis:Generalized Anxiety Disorder; Bipolar I Disorder, Mixed moderate  [name], [place], LCAS 05/12/19

## 2019-05-17 ENCOUNTER — Other Ambulatory Visit: Payer: Self-pay | Admitting: Psychiatry

## 2019-05-17 DIAGNOSIS — F411 Generalized anxiety disorder: Secondary | ICD-10-CM

## 2019-05-17 DIAGNOSIS — F3162 Bipolar disorder, current episode mixed, moderate: Secondary | ICD-10-CM

## 2019-05-19 ENCOUNTER — Other Ambulatory Visit: Payer: Self-pay | Admitting: Psychiatry

## 2019-05-19 DIAGNOSIS — F3177 Bipolar disorder, in partial remission, most recent episode mixed: Secondary | ICD-10-CM

## 2019-05-19 DIAGNOSIS — F411 Generalized anxiety disorder: Secondary | ICD-10-CM

## 2019-05-25 ENCOUNTER — Ambulatory Visit (HOSPITAL_COMMUNITY): Payer: PRIVATE HEALTH INSURANCE | Admitting: Licensed Clinical Social Worker

## 2019-06-07 ENCOUNTER — Ambulatory Visit (INDEPENDENT_AMBULATORY_CARE_PROVIDER_SITE_OTHER): Payer: PRIVATE HEALTH INSURANCE | Admitting: Licensed Clinical Social Worker

## 2019-06-07 ENCOUNTER — Other Ambulatory Visit: Payer: Self-pay

## 2019-06-07 DIAGNOSIS — F411 Generalized anxiety disorder: Secondary | ICD-10-CM | POA: Diagnosis not present

## 2019-06-07 DIAGNOSIS — F3162 Bipolar disorder, current episode mixed, moderate: Secondary | ICD-10-CM

## 2019-06-07 NOTE — Progress Notes (Signed)
Virtual Visit via Telephone Note  I connected with Carrie Burke on 06/07/19 at 3:00pm by telephone and verified that I am speaking with the correct person using two identifiers.  I discussed the limitations, risks, security and privacy concerns of performing an evaluation and management service by telephone and the availability of in person appointments. I also discussed with the patient that there may be a patient responsible charge related to this service. The patient expressed understanding and agreed to proceed.  I discussed the assessment and treatment plan with the patient. The patient was provided an opportunity to ask questions and all were answered. The patient agreed with the plan and demonstrated an understanding of the instructions.  The patient was advised to call back or seek an in-person evaluation if the symptoms worsen or if the condition fails to improve as anticipated.  I provided 1 hour of non-face-to-face time during this encounter.  [name], LCSW, LCAS _______________________ THERAPIST PROGRESS NOTE  Session Time: 3:00pm - 4:00pm  Location: Patient: Patient Home Provider: Clinical Home Office   Participation Level: Active  Behavioral Response: Alert, predominately depressed mood  Type of Therapy:  Individual Therapy  Treatment Goals addressed: Medication Compliance; Psychiatry follow-up; Anxiety management; Self-care routine to manage depression; Communication in relationship; Anger management   Interventions: CBT, coping skills and self-care routine  Summary: Carrie Burke is a 29 year old female that presented for telephone therapy session today with Generalized Anxiety Disorder and Bipolar I Disorder, mixed, moderate.     Suicidal/Homicidal: None; without plan or intent.    Therapist Response: Clinician spoke with Carrie Burke via telephone today due to her preference for this mode of communication rather than video sessions.  Clinician assessed for safety,  sobriety, and compliance with medication . Carrie Burke answered call for session on time and spoke in a manner that was alert, oriented x5, with no evidence or self-report of SI/HI or A/V H.  Carrie Burke reported that she continues taking medication as prescribed, but feels like the current dosage and schedule is not working effectively in curbing depressive symptoms, which have been gradually rising over past two weeks.  Clinician inquired about Carrie Burke's emotional ratings today, as well as any significant changes in thoughts, feelings, or behavior since previous appointment.  Carrie Burke reported scores of 8/10 for depression and 5/10 for anxiety.  She denied any anger or panic attacks in past weeks, stating "I've been feeling really empty lately, sleeping a lot more, and not exercising or wanting to do much".  Clinician proposed completing updated PHQ9 depression screening to determine severity of depressive episode, and Carrie Burke participated in this, scoring a 19.  Clinician inquired about whether there have been any significant events which preceded this mood shift and could have influenced it, but Carrie Burke denied this.  She noted that she has felt more lonely and isolated, so clinician inquired about whether she would be open to transitioning to group therapy 5 times per week to increase exposure to relatable peers and receive more consistent treatment for present issues.  Carrie Burke declined, noting that she is expected to get a raise soon, and worries about how taking time off from work might impact her job Carrie Burke.  Clinician inquired about her routine at the moment when she isn't sleeping or working.  Carrie Burke reported that she feels like she is only working each day, even after work, and after sleeping 10-11 hours on average, she still feels unrested and lacking purpose.  Clinician proposed revisiting coping skills list with Carrie Burke to  identify positive coping skills and/or self-care activities that could be included in  weekly routine to provide outlet for work stress and something to look forward to.  Carrie Burke was agreeable to this suggestion, and identified several appealing activities that stood out, such as doing Film/video editor, working on her flower bed, playing baseball with her kids, planning for date nights out with husband, attending church, looking through pictures at happy memories, dancing while playing upbeat music, and taking her dogs for walks and playing with them to increase exercise.  Clinician encouraged Carrie Burke to include some of these activities in her upcoming days to work towards giving her something to look forward to that might lift mood, and outreach her psychiatrist about these recent symptoms to inquire about whether medication changes might be warranted.  Carrie Burke was agreeable to this, and agreed to follow up on progress towards tasks in 1 week.  Clinician will continue to monitor.      Plan: Follow up again in 1 week.   Diagnosis: Generalized Anxiety Disorder; Bipolar I Disorder, Mixed moderate  [name], [place], LCAS 06/07/19

## 2019-06-08 ENCOUNTER — Encounter: Payer: Self-pay | Admitting: Psychiatry

## 2019-06-08 ENCOUNTER — Other Ambulatory Visit: Payer: Self-pay

## 2019-06-08 ENCOUNTER — Telehealth (INDEPENDENT_AMBULATORY_CARE_PROVIDER_SITE_OTHER): Payer: Self-pay | Admitting: Psychiatry

## 2019-06-08 DIAGNOSIS — G4700 Insomnia, unspecified: Secondary | ICD-10-CM

## 2019-06-08 DIAGNOSIS — F17200 Nicotine dependence, unspecified, uncomplicated: Secondary | ICD-10-CM

## 2019-06-08 DIAGNOSIS — F3132 Bipolar disorder, current episode depressed, moderate: Secondary | ICD-10-CM

## 2019-06-08 DIAGNOSIS — F411 Generalized anxiety disorder: Secondary | ICD-10-CM

## 2019-06-08 DIAGNOSIS — F410 Panic disorder [episodic paroxysmal anxiety] without agoraphobia: Secondary | ICD-10-CM

## 2019-06-08 MED ORDER — LORAZEPAM 0.5 MG PO TABS: 0.5000 mg | ORAL_TABLET | ORAL | 0 refills | Status: DC

## 2019-06-08 MED ORDER — ZIPRASIDONE HCL 40 MG PO CAPS: 40.0000 mg | ORAL_CAPSULE | Freq: Every day | ORAL | 1 refills | Status: DC

## 2019-06-08 MED ORDER — MIRTAZAPINE 7.5 MG PO TABS: 7.5000 mg | ORAL_TABLET | Freq: Every day | ORAL | 1 refills | Status: DC

## 2019-06-08 NOTE — Progress Notes (Signed)
Provider Location : [facility] Patient Location : Work  Virtual Visit via Video Note  I connected with Carrie Burke on 06/08/19 at  2:00 PM EDT by a video enabled telemedicine application and verified that I am speaking with the correct person using two identifiers.   I discussed the limitations of evaluation and management by telemedicine and the availability of in person appointments. The patient expressed understanding and agreed to proceed.    I discussed the assessment and treatment plan with the patient. The patient was provided an opportunity to ask questions and all were answered. The patient agreed with the plan and demonstrated an understanding of the instructions.   The patient was advised to call back or seek an in-person evaluation if the symptoms worsen or if the condition fails to improve as anticipated.   [place] MD OP Progress Note  06/08/2019 3:37 PM Carrie Burke  MRN:  [id number]  Chief Complaint:  Chief Complaint    Follow-up     HPI: Carrie Burke is a 29 year old Caucasian female, married, employed, lives in [place], has a history of bipolar disorder, chronic migraine, GAD, hypertension, history of lipoma was evaluated by telemedicine today.  Patient today reports she is currently struggling with depressive symptoms.  She reports she is struggling with lack of motivation excessive sleep during the day and inability to focus and this has been getting worse since the past few days.  She reports she takes the Abilify at night since it makes her groggy.  She reports she is not interested in increasing the dosage of Abilify since she is worried about the side effects of grogginess.  Patient reports she could not afford the Chantix and continues to smoke cigarettes.  She however is willing to quit and wonders what she can do to help her cut back.  Patient denies any suicidality or homicidality or perceptual disturbances.  Patient continues to be in  psychotherapy sessions and reports therapy sessions as beneficial.  Patient denies any other concerns today. Visit Diagnosis:    ICD-10-CM   1. Bipolar 1 disorder, depressed, moderate (HCC)  F31.32 ziprasidone (GEODON) 40 MG capsule  2. GAD (generalized anxiety disorder)  F41.1 ziprasidone (GEODON) 40 MG capsule    mirtazapine (REMERON) 7.5 MG tablet  3. Panic attacks  F41.0 mirtazapine (REMERON) 7.5 MG tablet    LORazepam (ATIVAN) 0.5 MG tablet  4. Insomnia, unspecified type  G47.00   5. Tobacco use disorder  F17.200     Past Psychiatric History: I have reviewed past psychiatric history from my progress note on 06/10/2018  Past Medical History:  Past Medical History:  Diagnosis Date  . Anxiety   . Bipolar disorder ([place])   . Common migraine with intractable migraine 03/18/2017  . Depression   . Elevated serum glutamic pyruvic transaminase (SGPT) level   . Gait abnormality 03/18/2017  . Gestational diabetes   . High cholesterol   . High-risk pregnancy 01/24/2014  . History of pre-eclampsia   . History of pre-eclampsia in prior pregnancy, currently pregnant in third trimester 01/24/2014  . Polyhydramnios in third trimester 02/11/2014  . Preeclampsia   . Vitamin D deficiency disease     Past Surgical History:  Procedure Laterality Date  . CLAVICLE SURGERY  2014  . TUBAL LIGATION      Family Psychiatric History: I have reviewed family psychiatric history from my progress note on 06/10/2018  Family History:  Family History  Problem Relation Age of Onset  . Arthritis Mother   .  Breast cancer Mother 80  . Depression Mother   . Anxiety disorder Mother   . Heart disease Father   . Hyperlipidemia Father   . Hypertension Father   . Asthma Brother   . Breast cancer Maternal Grandmother   . Breast cancer Paternal Grandmother   . Bipolar disorder Paternal Uncle     Social History: I have reviewed social history from my progress note on 06/10/2018 Social History   Socioeconomic  History  . Marital status: Married    Spouse name: jame  . Number of children: 3  . Years of education: Not on file  . Highest education level: High school graduate  Occupational History  . Not on file  Tobacco Use  . Smoking status: Current Every Day Smoker    Packs/day: 1.00    Types: Cigarettes  . Smokeless tobacco: Never Used  Substance and Sexual Activity  . Alcohol use: Yes    Comment: Occasionally  . Drug use: No  . Sexual activity: Yes    Birth control/protection: Surgical  Other Topics Concern  . Not on file  Social History Narrative   Lives with husband and kids   Caffeine use: coffee daily   Right handed    Social Determinants of Health   Financial Resource Strain: Low Risk   . Difficulty of Paying Living Expenses: Not hard at all  Food Insecurity: No Food Insecurity  . Worried About Charity fundraiser in the Last Year: Never true  . Ran Out of Food in the Last Year: Never true  Transportation Needs: No Transportation Needs  . Lack of Transportation (Medical): No  . Lack of Transportation (Non-Medical): No  Physical Activity: Inactive  . Days of Exercise per Week: 0 days  . Minutes of Exercise per Session: 0 min  Stress: Stress Concern Present  . Feeling of Stress : Very much  Social Connections: Unknown  . Frequency of Communication with Friends and Family: Not on file  . Frequency of Social Gatherings with Friends and Family: Not on file  . Attends Religious Services: Never  . Active Member of Clubs or Organizations: No  . Attends Archivist Meetings: Never  . Marital Status: Married    Allergies: No Known Allergies  Metabolic Disorder Labs: Lab Results  Component Value Date   HGBA1C 5.5 01/18/2019   No results found for: PROLACTIN Lab Results  Component Value Date   CHOL 268 (H) 01/18/2019   TRIG 635 (HH) 01/18/2019   HDL 43 01/18/2019   LDLCALC 113 (H) 01/18/2019   LDLCALC 102 (H) 02/27/2015   Lab Results  Component Value  Date   TSH 3.250 01/18/2019   TSH 2.570 09/04/2017    Therapeutic Level Labs: No results found for: LITHIUM No results found for: VALPROATE No components found for:  CBMZ  Current Medications: Current Outpatient Medications  Medication Sig Dispense Refill  . baclofen (LIORESAL) 10 MG tablet Take 0.5 tablets (5 mg total) by mouth 2 (two) times daily. 90 each 3  . cloNIDine (CATAPRES) 0.1 MG tablet Take 0.1 mg by mouth daily as needed.    Marland Kitchen escitalopram (LEXAPRO) 10 MG tablet TAKE 1 TABLET(10 MG) BY MOUTH DAILY 30 tablet 1  . LORazepam (ATIVAN) 0.5 MG tablet Take 1 tablet (0.5 mg total) by mouth as directed. Take one tablet up to 2-3 times a week only for severe panic attacks 8 tablet 0  . losartan (COZAAR) 50 MG tablet Take 1 tablet (50 mg total) by mouth  daily. 90 tablet 1  . mirtazapine (REMERON) 7.5 MG tablet Take 1 tablet (7.5 mg total) by mouth at bedtime. For sleep 30 tablet 1  . propranolol (INDERAL) 10 MG tablet Take 1 tablet (10 mg total) by mouth 2 (two) times daily as needed. As needed for severe anxiety attacks 60 tablet 1  . rizatriptan (MAXALT) 10 MG tablet Take 1 tablet (10 mg total) by mouth 3 (three) times daily as needed for migraine. 10 tablet 3  . ziprasidone (GEODON) 40 MG capsule Take 1 capsule (40 mg total) by mouth daily with supper. 30 capsule 1   No current facility-administered medications for this visit.     Musculoskeletal: Strength & Muscle Tone: UTA Gait & Station: normal Patient leans: N/A  Psychiatric Specialty Exam: Review of Systems  Psychiatric/Behavioral: Positive for dysphoric mood. The patient is nervous/anxious.   All other systems reviewed and are negative.   There were no vitals taken for this visit.There is no height or weight on file to calculate BMI.  General Appearance: Casual  Eye Contact:  Fair  Speech:  Clear and Coherent  Volume:  Normal  Mood:  Anxious and Depressed  Affect:  Congruent  Thought Process:  Goal Directed and  Descriptions of Associations: Intact  Orientation:  Full (Time, Place, and Person)  Thought Content: Logical   Suicidal Thoughts:  No  Homicidal Thoughts:  No  Memory:  Immediate;   Fair Recent;   Fair Remote;   Fair  Judgement:  Fair  Insight:  Fair  Psychomotor Activity:  Normal  Concentration:  Concentration: Fair and Attention Span: Fair  Recall:  [employer] of Knowledge: Fair  Language: Fair  Akathisia:  No  Handed:  Right  AIMS (if indicated): UTA  Assets:  Communication Skills Desire for Improvement Housing Social Support  ADL's:  Intact  Cognition: WNL  Sleep:  Fair   Screenings: GAD-7     Office Visit from 02/05/2019 in [facility] Visit from 01/18/2019 in [facility] Visit from 11/30/2018 in [facility] Visit from 05/08/2018 in [facility] Visit from 04/03/2018 in [facility]  Total GAD-7 Score  11  13  18  5  4     PHQ2-9     Counselor from 06/07/2019 in [facility] Visit from 02/05/2019 in [facility] Visit from 01/18/2019 in [facility] Visit from 11/30/2018 in [facility] Visit from 05/08/2018 in [facility]  PHQ-2 Total Score  5  2  2  4  2   PHQ-9 Total Score  19  11  12  17  9        Assessment and Plan: Carrie Burke is a 29 year old Caucasian female, employed, lives in [place], married, has a history of migraine headaches, hypertension, lipoma, bipolar disorder, GAD, panic attacks, tobacco use disorder was evaluated by telemedicine today.  Patient is biologically predisposed given her family history of mental health problems, her own medical issues.  Patient with psychosocial stressors of the current pandemic as well as health issues.  Patient is currently struggling with depressive symptoms and will benefit from medication readjustment and psychotherapy  sessions.  Plan as noted below.  Plan Bipolar disorder-current episode depressed-unstable Discontinue Abilify for lack of benefit and side effect Start Geodon 40 mg p.o. daily with supper. Continue Lexapro 10 mg p.o. daily  GAD-improving Lexapro 10 mg p.o. daily Mirtazapine 7.5 mg p.o.  nightly Propranolol 10 mg p.o. twice daily as needed  Panic attacks-improving Continue panic focused therapy with Mr. Chryl Heck Ativan 0.5 mg up to 1-2 times a week only for severe panic symptoms. I have reviewed [place] controlled substance database.  Insomnia-stable Remeron 7.5 mg p.o. nightly  Tobacco use disorder-unstable Provided information for smoking cessation classes. Discussed nicotine patches. Discontinue Chantix.    Follow-up in clinic in 3 weeks or sooner if needed.  I have spent atleast 20 minutes non  face to face with patient today. More than 50 % of the time was spent for preparing to see the patient ( e.g., review of test, records ),ordering medications and test ,psychoeducation and supportive psychotherapy and care coordination,as well as documenting clinical information in electronic health record. This note was generated in part or whole with voice recognition software. Voice recognition is usually quite accurate but there are transcription errors that can and very often do occur. I apologize for any typographical errors that were not detected and corrected.        Ursula Alert, MD 06/08/2019, 3:37 PM

## 2019-06-19 ENCOUNTER — Other Ambulatory Visit: Payer: Self-pay | Admitting: Psychiatry

## 2019-06-19 DIAGNOSIS — F410 Panic disorder [episodic paroxysmal anxiety] without agoraphobia: Secondary | ICD-10-CM

## 2019-06-19 DIAGNOSIS — F411 Generalized anxiety disorder: Secondary | ICD-10-CM

## 2019-06-21 ENCOUNTER — Other Ambulatory Visit: Payer: Self-pay

## 2019-06-21 ENCOUNTER — Ambulatory Visit (INDEPENDENT_AMBULATORY_CARE_PROVIDER_SITE_OTHER): Payer: PRIVATE HEALTH INSURANCE | Admitting: Licensed Clinical Social Worker

## 2019-06-21 DIAGNOSIS — F3162 Bipolar disorder, current episode mixed, moderate: Secondary | ICD-10-CM

## 2019-06-21 DIAGNOSIS — F411 Generalized anxiety disorder: Secondary | ICD-10-CM

## 2019-06-21 NOTE — Progress Notes (Signed)
Virtual Visit via Telephone Note  I connected with Carrie Burke on 06/21/19 at 3:00pm by telephone and verified that I am speaking with the correct person using two identifiers.  I discussed the limitations, risks, security and privacy concerns of performing an evaluation and management service by telephone and the availability of in person appointments. I also discussed with the patient that there may be a patient responsible charge related to this service. The patient expressed understanding and agreed to proceed.  I discussed the assessment and treatment plan with the patient. The patient was provided an opportunity to ask questions and all were answered. The patient agreed with the plan and demonstrated an understanding of the instructions.  The patient was advised to call back or seek an in-person evaluation if the symptoms worsen or if the condition fails to improve as anticipated.  I provided 1 hour of non-face-to-face time during this encounter.  [name], LCSW, LCAS _______________________ THERAPIST PROGRESS NOTE  Session Time: 3:00pm - 4:00pm  Location: Patient: Patient Home Provider: [facility] Office   Participation Level: Active  Behavioral Response: Alert, combined depressed/anxious mood  Type of Therapy:  Individual Therapy  Treatment Goals addressed: Medication Compliance; Psychiatry follow-up; Self-care routine to manage depression; Communication in relationship; Anger management   Interventions: CBT, stages of grief and anger management   Summary: Carrie Burke is a 29 year old female that presented for telephone therapy session today with Generalized Anxiety Disorder and Bipolar I Disorder, mixed, moderate.     Suicidal/Homicidal: None; without plan or intent.    Therapist Response: Clinician spoke with Carrie Burke via telephone today due to her inability to access [facility] and [facility] meetings for video sessions.  Clinician assessed for safety, sobriety,  and compliance with medication . Carrie Burke answered call for session on time and spoke in a manner that was alert, oriented x5, with no evidence or self-report of SI/HI or A/V H.  Carrie Burke reported ongoing compliance with medication and noted that since meeting with psychiatrist 2 weeks ago, she was started on Geodon, which has been helpful in reducing severity of depression symptoms, as well as increasing motivation to spend time with her husband and explore new coping skills as part of self-care routine.  Clinician also inquired about Carrie Burke current emotional ratings, as well as any significant changes in thoughts, feelings, or behavior since last session.  Carrie Burke reported scores of 6/10 for both depression and anxiety.  She reported that although her depression is lower, she has felt more anxious and irritable due to the passing of a close friend last Friday.  Clinician expressed sympathy for Carrie Burke loss and inquired about how she has been handling the grieving process.  Carrie Burke reported that she has taken this hard, but has tried to stay focused on work and stay around family for ongoing support.  Clinician discussed the stages of grief (I.e. denial, anger, bargaining, depression and acceptance) with Carrie Burke to help her better understand the changes in cognition and feeling that typically appear as she processes the loss, and work to normalize this experience.  Carrie Burke reported that she has had many losses in her past which she "Swept under the rug", as her parents did not teach her how to deal with these feelings in a healthy way, leading her to drink later on in life in order to numb herself.  She reported that she most related to the anger stage, acknowledging that she felt like her angry outbursts might be linked to these collective losses, as  well as a strong sense of pride in helping herself and appearing like nothing is wrong.  Clinician encouraged Carrie Burke to use future therapy sessions, journaling in  free time, and open, honest conversations about her feelings with close supports to better understand these underlying feelings, and facilitate healthier grieving and anger management.  Intervention was effective, as evidenced by Carrie Burke reporting that this conversation increased overall insight into her behavioral patterns, and motivated her to speak with her supports about challenges more often.  She reported that she would also try to be a role model to her children by showing them that its okay to share difficult feelings and seek advice when struggling as well.  Clinician will continue to monitor.      Plan: Follow up again in 1 week.   Diagnosis: Generalized Anxiety Disorder; Bipolar I Disorder, Mixed moderate  [name], [place], LCAS 06/21/19

## 2019-07-05 ENCOUNTER — Other Ambulatory Visit: Payer: Self-pay

## 2019-07-05 ENCOUNTER — Ambulatory Visit (HOSPITAL_COMMUNITY): Payer: PRIVATE HEALTH INSURANCE | Admitting: Licensed Clinical Social Worker

## 2019-07-08 ENCOUNTER — Telehealth (INDEPENDENT_AMBULATORY_CARE_PROVIDER_SITE_OTHER): Payer: Self-pay | Admitting: Psychiatry

## 2019-07-08 ENCOUNTER — Other Ambulatory Visit: Payer: Self-pay

## 2019-07-08 DIAGNOSIS — F17200 Nicotine dependence, unspecified, uncomplicated: Secondary | ICD-10-CM

## 2019-07-08 DIAGNOSIS — G4700 Insomnia, unspecified: Secondary | ICD-10-CM

## 2019-07-08 DIAGNOSIS — F411 Generalized anxiety disorder: Secondary | ICD-10-CM

## 2019-07-08 DIAGNOSIS — F3162 Bipolar disorder, current episode mixed, moderate: Secondary | ICD-10-CM

## 2019-07-08 DIAGNOSIS — F410 Panic disorder [episodic paroxysmal anxiety] without agoraphobia: Secondary | ICD-10-CM

## 2019-07-08 NOTE — Progress Notes (Signed)
Patient picked up the phone and said she will not be able to do this appointment.  Discussed with patient to call back to reschedule.

## 2019-07-12 ENCOUNTER — Other Ambulatory Visit: Payer: Self-pay

## 2019-07-12 ENCOUNTER — Telehealth: Payer: Self-pay | Admitting: Psychiatry

## 2019-07-12 ENCOUNTER — Telehealth (HOSPITAL_COMMUNITY): Payer: Self-pay | Admitting: Licensed Clinical Social Worker

## 2019-07-12 ENCOUNTER — Ambulatory Visit (INDEPENDENT_AMBULATORY_CARE_PROVIDER_SITE_OTHER): Payer: PRIVATE HEALTH INSURANCE | Admitting: Licensed Clinical Social Worker

## 2019-07-12 DIAGNOSIS — F411 Generalized anxiety disorder: Secondary | ICD-10-CM

## 2019-07-12 DIAGNOSIS — F3162 Bipolar disorder, current episode mixed, moderate: Secondary | ICD-10-CM | POA: Diagnosis not present

## 2019-07-12 DIAGNOSIS — F3132 Bipolar disorder, current episode depressed, moderate: Secondary | ICD-10-CM

## 2019-07-12 DIAGNOSIS — F410 Panic disorder [episodic paroxysmal anxiety] without agoraphobia: Secondary | ICD-10-CM

## 2019-07-12 MED ORDER — ZIPRASIDONE HCL 40 MG PO CAPS: 40.0000 mg | ORAL_CAPSULE | Freq: Every day | ORAL | 1 refills | Status: DC

## 2019-07-12 MED ORDER — LORAZEPAM 0.5 MG PO TABS: 0.5000 mg | ORAL_TABLET | ORAL | 1 refills | Status: DC

## 2019-07-12 MED ORDER — ESCITALOPRAM OXALATE 10 MG PO TABS: ORAL_TABLET | ORAL | 1 refills | Status: DC

## 2019-07-12 NOTE — Telephone Encounter (Signed)
Returned call to patient.  Patient reports that she is okay for now and does not have any significant mood swings however would like to have an appointment with writer since she was not able to keep her most recent appointment.  Discussed with patient that she can't be put on a wait list if anything opens up sooner.  We'll refill her medications today.

## 2019-07-12 NOTE — Progress Notes (Signed)
**Note Carrie-Identified via Obfuscation** Virtual Visit via Telephone Note   I connected with Carrie Burke on 07/12/19 at 3:00pm by telephone and verified that I am speaking with the correct person using two identifiers.   I discussed the limitations, risks, security and privacy concerns of performing an evaluation and management service by telephone and the availability of in person appointments. I also discussed with the patient that there may be a patient responsible charge related to this service. The patient expressed understanding and agreed to proceed.   I discussed the assessment and treatment plan with the patient. The patient was provided an opportunity to ask questions and all were answered. The patient agreed with the plan and demonstrated an understanding of the instructions.   The patient was advised to call back or seek an in-person evaluation if the symptoms worsen or if the condition fails to improve as anticipated.   I provided 30 minutes of non-face-to-face time during this encounter.   Carrie Flood, LCSW, LCAS _______________________ THERAPIST PROGRESS NOTE   Session Time: 3:00pm - 3:30pm  Location: Patient: Patient Home Provider: [place] [facility]    Participation Level: Active   Behavioral Response: Alert, combined depressed/anxious mood   Type of Therapy:  Individual Therapy   Treatment Goals addressed: Medication Compliance; Self-care routine to manage depression; Communication in relationship; Anger management   Interventions: CBT, communication and anger management skills   Summary: Carrie Burke is a 29 year old female that presented for telephone therapy session today with diagnoses of Generalized Anxiety Disorder and Bipolar I Disorder, mixed, moderate.     Suicidal/Homicidal: None; without plan or intent.     Therapist Response: Clinician spoke with Carrie Burke via telephone today due to her inability to access video applications for therapy sessions.  Clinician assessed for safety, sobriety, and  medication compliance. Carrie Burke answered phonecall for session on time and spoke in a manner that was alert, oriented x5, with no evidence or self-report of SI/HI or A/V H.  Carrie Burke reported that she continues taking medication as prescribed and denied abuse of alcohol or illicit substances.  Clinician  inquired about Carrie Burke's emotional ratings today, as well as any significant changes in thoughts, feelings, or behavior since previous appointment.  Carrie Burke reported scores of 9/10 for both depression and anxiety and stated "I've been under a lot more stress lately".  Clinician inquired about Carrie Burke's present stressors at this time, and what she has been doing to manage these to avoid recurrent outbursts.  Carrie Burke acknowledged that she has been trying to bottle in difficult feelings as she has done unsuccessfully in the past, and noted that this has led to arguments with her husband twice in past week over unrelated issues.  Carrie Burke reported that her most prominent stressors have included learning that her aunt was hospitalized, her car broke down, her sons have been misbehaving, and she had to cover another person's position at work in addition to her own duties.  Clinician assisted Carrie Burke in ranking severity of these stressors, as well as exploring solutions to negate their impact on her current mood.  Carrie Burke reported that her car is now in the shop being repaired, and the employee that was out has now returned, so these issues have resolved themselves, but her children's behavior is getting worse, and she is concerned about her aunt's health.  Clinician recommended Carrie Burke first have a meeting with her husband about recent arguments to express her repressed emotions, and gain feedback on how to deal with her children's behavior in a united  approach.  Clinician also encouraged Carrie Burke to stay close to relatives at this time while her aunt is hospitalized so that she can gain additional family support.  Carrie Burke  was receptive to these suggestions, and reported that she would plan to go home and rest since she has been more fatigued, then speak with her husband about present concerns, and strategize what issues need to be addressed with the children for the following day.  Carrie Burke reported that she has been visiting her parent's house often and they are calling the aunt daily to stay in touch for updates, so this has been helpful for alleviating some anxiety.  Clinician inquired about how much time Carrie Burke has also been dedicating to her self-care routine to help balance stressors.  Carrie Burke stated "Basically nothing, I've been too distracted".  Clinician encouraged Carrie Burke to try to make time this week for healthy activities like exercising, and listening to uplifting music as additional outlets for stress, and Carrie Burke acknowledged that she would start taking walks again and listen to Carrie Burke since it inspires hope for her.  Interventions were effective, as evidenced by Carrie Burke stating "This was good to talk about.  It opened my eyes to what I need to be doing moving forward.  More than anything I need to relax and take time for myself".  Clinician will continue to monitor.       Plan: Follow up again in 1 week.    Diagnosis: Generalized Anxiety Disorder; Bipolar I Disorder, Mixed moderate   Carrie Burke, Carrie Burke, LCAS 07/12/19

## 2019-07-26 ENCOUNTER — Other Ambulatory Visit: Payer: Self-pay

## 2019-07-26 ENCOUNTER — Ambulatory Visit (INDEPENDENT_AMBULATORY_CARE_PROVIDER_SITE_OTHER): Payer: PRIVATE HEALTH INSURANCE | Admitting: Licensed Clinical Social Worker

## 2019-07-26 DIAGNOSIS — F411 Generalized anxiety disorder: Secondary | ICD-10-CM

## 2019-07-26 DIAGNOSIS — F3162 Bipolar disorder, current episode mixed, moderate: Secondary | ICD-10-CM | POA: Diagnosis not present

## 2019-07-26 NOTE — Progress Notes (Signed)
Virtual Visit via Telephone Note   I connected with Carrie Burke on 07/26/19 at 3:00pm by telephone and verified that I am speaking with the correct person using two identifiers.   I discussed the limitations, risks, security and privacy concerns of performing an evaluation and management service by telephone and the availability of in person appointments. I also discussed with the patient that there may be a patient responsible charge related to this service. The patient expressed understanding and agreed to proceed.   I discussed the assessment and treatment plan with the patient. The patient was provided an opportunity to ask questions and all were answered. The patient agreed with the plan and demonstrated an understanding of the instructions.   The patient was advised to call back or seek an in-person evaluation if the symptoms worsen or if the condition fails to improve as anticipated.   I provided 45 minutes of non-face-to-face time during this encounter.                                                                        [name], LCSW, LCAS                                                                    _______________________                                                               THERAPIST PROGRESS NOTE    Session Time: 3:00pm - 3:45pm  Location: Patient: Patient Home Provider: [place] [facility]    Participation Level: Active   Behavioral Response: Alert, depressed mood   Type of Therapy:  Individual Therapy   Treatment Goals addressed: Medication Compliance; Self-care routine to manage depression; Communication in relationship; Anger management   Interventions: CBT, stages of grief   Summary: Carrie Burke is a 29 year old female that presented for telephone therapy session today with diagnoses of Generalized Anxiety Disorder and Bipolar I Disorder, mixed, moderate.     Suicidal/Homicidal: None; without plan or intent.     Therapist Response: Clinician spoke  with Angeline via telephone today due to her inability to access video applications for therapy.  Clinician assessed for safety, sobriety, and medication compliance. Yemariam answered phonecall for appointment on time and spoke in a manner that was alert, oriented x5, with no evidence or self-report of SI/HI or A/V H.  [name] reported that she has missed one or two days of medication recently, and disclosed that she drank a 12 pack of Busch [employer] beer in past 4 days, averaging 3 per day.  Clinician encouraged Linzee to avoid missing anymore doses of medication, and advised against drinking heavily while on medication.  Clinician inquired about Algie's current emotional ratings, as well as any significant changes in thoughts,  feelings, or behavior since last session.  Honour reported scores of 9/10 for both depression and anxiety, which is consistent with previous session, and stated "My aunt passed away last 2022-05-02 and its been really hard on me".  Clinician empathized with [name] and inquired about how she has been attempting to handle this loss.  [name] acknowledged that her increased drinking was a direct result of this event, and she has gone on bereavement pay from work to be with family and prepare for upcoming funeral. [name] reported that she has been spending a lot of time with her mother and father processing this loss since they were all close to the aunt.  [name] reported that her husband has also been supportive and trying to take on more responsibility so that she will not be as stressed, but she has found it hard to control her emotions at times, stating "One minute I'm fine, the next I lose it".  Clinician discussed stages of grief model with Braden today to help provide clarity on what to expect as she processes this recent passing, describing mood/behavior changes to be expected in each stage (I.e. denial, anger, bargaining, depression, and acceptance), as well as ways to cope with each  successfully.  [name] acknowledged that she struggled with denial at first, as well as bargaining, but has been more depressed and angry lately, stating "My patience level is very thin, and little things set me off right now.  I think its because I've always tried to bottle up my feelings until I pop".  Clinician encouraged [name] to avoid isolation at this time and be honest with her feelings around supportive, understanding family members, in addition to considering entry into grief and loss support groups, such as the virtual one offered through [facility]. Clinician also encouraged [name] to continue practicing anger management/relaxation techniques to manage extreme mood swings, and set aside time for self-care while on work leave.  [name] was agreeable to this and stated "I feel a little better.  I'm going to stop bottling things up and try to have a more positive outlook".  Clinician will continue to monitor.       Plan: Follow up again in 1 week.    Diagnosis: Generalized Anxiety Disorder; Bipolar I Disorder, Mixed moderate   [name], [place], LCAS 07/26/19

## 2019-08-02 ENCOUNTER — Other Ambulatory Visit: Payer: Self-pay

## 2019-08-02 ENCOUNTER — Ambulatory Visit (INDEPENDENT_AMBULATORY_CARE_PROVIDER_SITE_OTHER): Payer: PRIVATE HEALTH INSURANCE | Admitting: Licensed Clinical Social Worker

## 2019-08-02 DIAGNOSIS — F411 Generalized anxiety disorder: Secondary | ICD-10-CM | POA: Diagnosis not present

## 2019-08-02 DIAGNOSIS — F3162 Bipolar disorder, current episode mixed, moderate: Secondary | ICD-10-CM

## 2019-08-02 NOTE — Progress Notes (Signed)
Virtual Visit via Telephone Note   I connected with Carrie Burke on 08/02/19 at 3:00pm by telephone and verified that I am speaking with the correct person using two identifiers.   I discussed the limitations, risks, security and privacy concerns of performing an evaluation and management service by telephone and the availability of in person appointments. I also discussed with the patient that there may be a patient responsible charge related to this service. The patient expressed understanding and agreed to proceed.   I discussed the assessment and treatment plan with the patient. The patient was provided an opportunity to ask questions and all were answered. The patient agreed with the plan and demonstrated an understanding of the instructions.   The patient was advised to call back or seek an in-person evaluation if the symptoms worsen or if the condition fails to improve as anticipated.   I provided 45 minutes of non-face-to-face time during this encounter.                                                      [name], LCSW, LCAS                                                   _______________________                                                THERAPIST PROGRESS NOTE    Session Time: 3:00pm - 3:45pm  Location: Patient: Patient Home Provider: [place] [facility]    Participation Level: Active   Behavioral Response: Alert, depressed mood   Type of Therapy:  Individual Therapy   Treatment Goals addressed: Medication Compliance; Self-care routine to manage depression; Communication in relationship; Anger management   Interventions: CBT, stages of grief, treatment planning    Summary: Carrie Burke is a 29 year old female that presented for telephone therapy session today with diagnoses of Generalized Anxiety Disorder and Bipolar I Disorder, mixed, moderate.     Suicidal/Homicidal: None; without plan or intent.     Therapist Response: Clinician spoke with Carrie Burke via telephone  today due to her inability to access video applications for therapy.  Clinician assessed for safety, sobriety, and medication compliance. Kimerly answered phonecall for this session on time and spoke in a manner that was alert, oriented x5, with no evidence or self-report of SI/HI or A/V H.  Deidre reported compliance with medication and denied use of alcohol or illicit substances since last session.  Clinician inquired about Carrie Burke's emotional ratings today, as well as any significant changes in thoughts, feelings, or behavior since previous appointment.  Carrie Burke reported scores of 5/10 for depression and 4/10 for anxiety, and stated "I did have a panic attack at my aunt's funeral on Friday".  Clinician empathized with Carrie Burke and processed this recent challenge with her, including how she attempted to cope on this difficult day, and where she is presently in regard to previously discussed stages of grief.  Marika reported that it was an emotional day, and when the service was underway,  she began to notice telltale signs of an attack such as hyperventilation, increased heartrate, and a feeling of claustrophobia, so she informed her husband, and he took her outside to calm down as he they have done in previously stressful situations.  Carrie Burke reported that she also took time to speak with the preacher, which helped increase her understanding and provide some comfort.  Carrie Burke stated "I'm still depressed but it is a little better.  I sat and thought about it a lot, and now I know she's in a better place than [place], and I accept it".  Carrie Burke reported that her bereavement leave from work was helpful for focusing on spending time with her family, and there were several occasions where they sat down and watched movies to balance out the sadness.  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  Clinician assisted Carrie Burke with running cost benefit analysis of this position change, noting that this would offer more financial security and personal growth, but could lead to increased stress as well, which has influenced outbursts in the past.  Carrie Burke acknowledged risks and stated "I have always wanted to move up in the company, and know that I will need to balance self-care better".  Carrie Burke reported that she would address this by prioritizing activities like exercise, reading, listening to relaxing music in free time to improve balance, and try to be mindful of her mood when around husband.  Clinician noted that treatment plan needs to be updated at this time, and collaborated with [name] today to make updates as follows:  Meet with clinician virtually once per week for therapy to address progress and needs; Meet with psychiatrist once every 2-3 months to address efficacy of medication and make adjustments as needed to regimen and/or dosage; Take medications daily as prescribed to reduce symptoms and improve overall daily functioning; Reduce anxiety from average severity of 8/10 to 6/10 in the next 90 days by utilize relaxation techniques 2-3 times per day such as mindful breathing, progressive muscle relaxation, and/or positive visualizations; Reduce depression from average severity of 8/10 to 6/10 in next 90 days by engaging in 3 hours daily of positive activities such as listening to music or podcasts, reading, doing artwork, and spending time with children; Explore anger management techniques in therapy that can be used to reduce outbursts at home from x1 daily to 2-3x per week in next 90 days; Exercise 2 times per week for at least 10-15 minutes per session, in addition to following low-cholesterol diet to improve both physical and mental wellbeing; Maintain employment at [employer] working 40 hours per week to ensure financial stability and day to day productivity;  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  Roshawn reported that therapy remains beneficial, and she would prefer to continue meeting once per week based upon ongoing needs.  Clinician will continue to monitor.       Plan: Follow up again in 1 week.    Diagnosis: Generalized Anxiety Disorder; Bipolar I Disorder, Mixed  moderate   [name], [place], LCAS 08/02/19

## 2019-08-09 ENCOUNTER — Telehealth (HOSPITAL_COMMUNITY): Payer: Self-pay | Admitting: Licensed Clinical Social Worker

## 2019-08-09 ENCOUNTER — Other Ambulatory Visit: Payer: Self-pay

## 2019-08-09 ENCOUNTER — Ambulatory Visit (HOSPITAL_COMMUNITY): Payer: PRIVATE HEALTH INSURANCE | Admitting: Licensed Clinical Social Worker

## 2019-08-09 NOTE — Telephone Encounter (Signed)
Chistine had a virtual therapy appointment scheduled today at 3pm.  Clinician attempted to contact her by phone, as she has had trouble connecting by video application.  Gwendlyn answered call and spoke in a manner that was alert, oriented x5, with no evidence or self-report of SI/HI or A/V H.  Iyari reported that she would need to reschedule appointment for another day, as she had just arrived to urgent care clinic and was being assessed, as her liver enzymes were low.  Niasia agreed to reschedule for a more convenient time later in week once she is feeling stable.  Clinician will continue to monitor.     [name], LCSW, LCAS 08/09/19

## 2019-08-26 ENCOUNTER — Other Ambulatory Visit: Payer: Self-pay | Admitting: *Deleted

## 2019-08-26 MED ORDER — BACLOFEN 10 MG PO TABS: 5.0000 mg | ORAL_TABLET | Freq: Two times a day (BID) | ORAL | 0 refills | Status: DC

## 2019-08-26 NOTE — Telephone Encounter (Signed)
Called pt she is taking baclofen 1/2 tab po bid , not taking gabapentin.

## 2019-08-28 ENCOUNTER — Other Ambulatory Visit: Payer: Self-pay | Admitting: Psychiatry

## 2019-08-28 DIAGNOSIS — F411 Generalized anxiety disorder: Secondary | ICD-10-CM

## 2019-08-28 DIAGNOSIS — F3162 Bipolar disorder, current episode mixed, moderate: Secondary | ICD-10-CM

## 2019-09-02 ENCOUNTER — Other Ambulatory Visit: Payer: Self-pay | Admitting: Gastroenterology

## 2019-09-16 ENCOUNTER — Telehealth (INDEPENDENT_AMBULATORY_CARE_PROVIDER_SITE_OTHER): Payer: Self-pay | Admitting: Psychiatry

## 2019-09-16 ENCOUNTER — Encounter: Payer: Self-pay | Admitting: Psychiatry

## 2019-09-16 ENCOUNTER — Other Ambulatory Visit: Payer: Self-pay

## 2019-09-16 DIAGNOSIS — F17200 Nicotine dependence, unspecified, uncomplicated: Secondary | ICD-10-CM

## 2019-09-16 DIAGNOSIS — F410 Panic disorder [episodic paroxysmal anxiety] without agoraphobia: Secondary | ICD-10-CM

## 2019-09-16 DIAGNOSIS — F3161 Bipolar disorder, current episode mixed, mild: Secondary | ICD-10-CM | POA: Insufficient documentation

## 2019-09-16 DIAGNOSIS — F411 Generalized anxiety disorder: Secondary | ICD-10-CM

## 2019-09-16 DIAGNOSIS — G4700 Insomnia, unspecified: Secondary | ICD-10-CM

## 2019-09-16 MED ORDER — ZIPRASIDONE HCL 60 MG PO CAPS: 60.0000 mg | ORAL_CAPSULE | Freq: Every day | ORAL | 1 refills | Status: DC

## 2019-09-16 NOTE — Progress Notes (Signed)
Provider Location : [facility] Patient Location : Car  Participants: Patient , Provider  Virtual Visit via Video Note  I connected with Carrie Burke on 09/16/19 at  9:45 AM EDT by a video enabled telemedicine application and verified that I am speaking with the correct person using two identifiers.   I discussed the limitations of evaluation and management by telemedicine and the availability of in person appointments. The patient expressed understanding and agreed to proceed.     I discussed the assessment and treatment plan with the patient. The patient was provided an opportunity to ask questions and all were answered. The patient agreed with the plan and demonstrated an understanding of the instructions.   The patient was advised to call back or seek an in-person evaluation if the symptoms worsen or if the condition fails to improve as anticipated.  [facility] MD OP Progress Note  09/16/2019 12:25 PM Laura Rachal Dvorsky  MRN:  [id number]  Chief Complaint:  Chief Complaint    Follow-up     HPI: Carrie Burke is a 29 year old Caucasian female, married, employed, lives in [place], has a history of bipolar disorder, chronic migraine, GAD, hypertension, history of lipoma was evaluated by telemedicine today.  Patient today reports she is currently struggling with mood swings.  She is often irritable, is unable to focus, has sleep issues and also has anxiety symptoms.  She also reports spending sprees, and relationship struggles due to her recent mood swings.  She reports her sleep is interrupted several times at night.  She reports she wakes up in the middle of the night to take a snack and then try to go back to bed when she can.  The symptoms has been getting worse since the past 2 weeks.  She reports since starting the Geodon she did feel better for a few weeks.  She however reports her symptoms started getting worse again since the past few weeks.  Patient is tolerating the  Geodon well.  Denies side effects.  Patient reports she takes the mirtazapine only as needed and does not take it every night.  She has had panic attacks at least 3-4 times since her last visit with Probation officer which was in May.  She is limiting the use of lorazepam.  She continues to smoke cigarettes and reports she is unable to cut back much.  Patient denies any suicidality, homicidality or perceptual disturbances.    Visit Diagnosis:    ICD-10-CM   1. Bipolar 1 disorder, mixed, mild (HCC)  F31.61 ziprasidone (GEODON) 60 MG capsule  2. GAD (generalized anxiety disorder)  F41.1   3. Panic attacks  F41.0   4. Insomnia, unspecified type  G47.00   5. Tobacco use disorder  F17.200     Past Psychiatric History: I have reviewed past psychiatric history from my progress note on 06/10/2018  Past Medical History:  Past Medical History:  Diagnosis Date  . Anxiety   . Bipolar disorder ([place])   . Common migraine with intractable migraine 03/18/2017  . Depression   . Elevated serum glutamic pyruvic transaminase (SGPT) level   . Gait abnormality 03/18/2017  . Gestational diabetes   . High cholesterol   . High-risk pregnancy 01/24/2014  . History of pre-eclampsia   . History of pre-eclampsia in prior pregnancy, currently pregnant in third trimester 01/24/2014  . Polyhydramnios in third trimester 02/11/2014  . Preeclampsia   . Vitamin D deficiency disease     Past Surgical History:  Procedure Laterality Date  .  CLAVICLE SURGERY  2014  . TUBAL LIGATION      Family Psychiatric History: Reviewed family psychiatric history from my progress note on 06/10/2018  Family History:  Family History  Problem Relation Age of Onset  . Arthritis Mother   . Breast cancer Mother 49  . Depression Mother   . Anxiety disorder Mother   . Heart disease Father   . Hyperlipidemia Father   . Hypertension Father   . Asthma Brother   . Breast cancer Maternal Grandmother   . Breast cancer Paternal Grandmother   .  Bipolar disorder Paternal Uncle     Social History: Reviewed social history from my progress note on 06/10/2018 Social History   Socioeconomic History  . Marital status: Married    Spouse name: jame  . Number of children: 3  . Years of education: Not on file  . Highest education level: High school graduate  Occupational History  . Not on file  Tobacco Use  . Smoking status: Current Every Day Smoker    Packs/day: 1.00    Types: Cigarettes  . Smokeless tobacco: Never Used  Vaping Use  . Vaping Use: Never used  Substance and Sexual Activity  . Alcohol use: Yes    Comment: Occasionally  . Drug use: No  . Sexual activity: Yes    Birth control/protection: Surgical  Other Topics Concern  . Not on file  Social History Narrative   Lives with husband and kids   Caffeine use: coffee daily   Right handed    Social Determinants of Health   Financial Resource Strain:   . Difficulty of Paying Living Expenses: Not on file  Food Insecurity:   . Worried About Charity fundraiser in the Last Year: Not on file  . Ran Out of Food in the Last Year: Not on file  Transportation Needs:   . Lack of Transportation (Medical): Not on file  . Lack of Transportation (Non-Medical): Not on file  Physical Activity:   . Days of Exercise per Week: Not on file  . Minutes of Exercise per Session: Not on file  Stress:   . Feeling of Stress : Not on file  Social Connections:   . Frequency of Communication with Friends and Family: Not on file  . Frequency of Social Gatherings with Friends and Family: Not on file  . Attends Religious Services: Not on file  . Active Member of Clubs or Organizations: Not on file  . Attends Archivist Meetings: Not on file  . Marital Status: Not on file    Allergies: No Known Allergies  Metabolic Disorder Labs: Lab Results  Component Value Date   HGBA1C 5.5 01/18/2019   No results found for: PROLACTIN Lab Results  Component Value Date   CHOL 268 (H)  01/18/2019   TRIG 635 (HH) 01/18/2019   HDL 43 01/18/2019   LDLCALC 113 (H) 01/18/2019   LDLCALC 102 (H) 02/27/2015   Lab Results  Component Value Date   TSH 3.250 01/18/2019   TSH 2.570 09/04/2017    Therapeutic Level Labs: No results found for: LITHIUM No results found for: VALPROATE No components found for:  CBMZ  Current Medications: Current Outpatient Medications  Medication Sig Dispense Refill  . baclofen (LIORESAL) 10 MG tablet Take 0.5 tablets (5 mg total) by mouth 2 (two) times daily. 90 each 0  . cloNIDine (CATAPRES) 0.1 MG tablet Take 0.1 mg by mouth daily as needed.    . dicyclomine (BENTYL) 10 MG  capsule Take 10 mg by mouth every 6 (six) hours as needed.    Marland Kitchen escitalopram (LEXAPRO) 10 MG tablet TAKE 1 TABLET(10 MG) BY MOUTH DAILY 30 tablet 1  . LORazepam (ATIVAN) 0.5 MG tablet Take 1 tablet (0.5 mg total) by mouth as directed. Take one tablet up to 2-3 times a week only for severe panic attacks 6 tablet 1  . losartan (COZAAR) 50 MG tablet Take 1 tablet (50 mg total) by mouth daily. 90 tablet 1  . mirtazapine (REMERON) 7.5 MG tablet TAKE 1 TABLET(7.5 MG) BY MOUTH AT BEDTIME FOR SLEEP 30 tablet 1  . propranolol (INDERAL) 10 MG tablet TAKE 1 TABLET(10 MG) BY MOUTH TWICE DAILY AS NEEDED FOR SEVERE ANXIETY 60 tablet 1  . rizatriptan (MAXALT) 10 MG tablet Take 1 tablet (10 mg total) by mouth 3 (three) times daily as needed for migraine. 10 tablet 3  . ziprasidone (GEODON) 60 MG capsule Take 1 capsule (60 mg total) by mouth daily with supper. 30 capsule 1   No current facility-administered medications for this visit.     Musculoskeletal: Strength & Muscle Tone: UTA Gait & Station: normal Patient leans: N/A  Psychiatric Specialty Exam: Review of Systems  Psychiatric/Behavioral: Positive for decreased concentration and sleep disturbance. The patient is nervous/anxious.   All other systems reviewed and are negative.   There were no vitals taken for this visit.There is  no height or weight on file to calculate BMI.  General Appearance: Casual  Eye Contact:  Fair  Speech:  Clear and Coherent  Volume:  Normal  Mood:  Anxious and Irritable  Affect:  Appropriate  Thought Process:  Goal Directed and Descriptions of Associations: Intact  Orientation:  Full (Time, Place, and Person)  Thought Content: Logical   Suicidal Thoughts:  No  Homicidal Thoughts:  No  Memory:  Immediate;   Fair Recent;   Fair Remote;   Fair  Judgement:  Fair  Insight:  Fair  Psychomotor Activity:  Normal  Concentration:  Concentration: Fair and Attention Span: Fair  Recall:  [employer] of Knowledge: Fair  Language: Fair  Akathisia:  No  Handed:  Right  AIMS (if indicated): UTA  Assets:  Communication Skills Desire for Improvement Housing Social Support  ADL's:  Intact  Cognition: WNL  Sleep:  Poor   Screenings: GAD-7     Office Visit from 02/05/2019 in [facility] Visit from 01/18/2019 in [facility] Visit from 11/30/2018 in [facility] Visit from 05/08/2018 in [facility] Visit from 04/03/2018 in [facility]  Total GAD-7 Score 11 13 18 5 4     PHQ2-9     Counselor from 06/07/2019 in [facility] Office Visit from 02/05/2019 in [facility] Visit from 01/18/2019 in [facility] Visit from 11/30/2018 in [facility] Visit from 05/08/2018 in [facility]  PHQ-2 Total Score 5 2 2 4 2   PHQ-9 Total Score 19 11 12 17 9        Assessment and Plan: Mally Gavina is a 29 year old Caucasian female, employed, lives in [place], married, has a history of migraine headaches, hypertension, bipolar disorder, GAD, panic attacks, tobacco use disorder was evaluated by telemedicine today.  Patient is biologically predisposed given her family history of mental health problems, her medical issues.   Patient with psychosocial stressors of current pandemic as well as health issues.  Patient is currently struggling with  possible mixed episode, mood lability and sleep issues.  Plan as noted below.  Plan Bipolar disorder-current episode mixed-unstable Increase Geodon to 60 mg p.o. daily with supper Lexapro 10 mg p.o. daily  GAD-some progress Lexapro 10 mg p.o. daily Mirtazapine 7.5 mg p.o. nightly Propranolol 10 mg p.o. 3 times daily as needed  Panic attacks-stable Ativan 0.5 mg up to 1-2 times a week only for severe panic symptoms Continue panic focused therapy with Mr. Chryl Heck I have reviewed [place] controlled substance database.  Insomnia-unstable Geodon dosage has been increased. Remeron 7.5 mg p.o. nightly-patient advised to take the mirtazapine more frequently.   Tobacco use disorder-unstable Provided smoking cessation counseling.  Follow-up in clinic in 2 weeks or sooner if needed.  I have spent atleast 20 minutes face to face with patient today. More than 50 % of the time was spent for preparing to see the patient ( e.g., review of test, records ),ordering medications and test ,psychoeducation and supportive psychotherapy and care coordination,as well as documenting clinical information in electronic health record. This note was generated in part or whole with voice recognition software. Voice recognition is usually quite accurate but there are transcription errors that can and very often do occur. I apologize for any typographical errors that were not detected and corrected.      Ursula Alert, MD 09/16/2019, 12:25 PM

## 2019-09-22 ENCOUNTER — Other Ambulatory Visit: Payer: Self-pay

## 2019-09-22 MED ORDER — LOSARTAN POTASSIUM 50 MG PO TABS: 50.0000 mg | ORAL_TABLET | Freq: Every day | ORAL | 0 refills | Status: DC

## 2019-09-22 NOTE — Telephone Encounter (Signed)
LOV: 02/05/19  Last filled 02/05/19 for 90 with 1 refill.

## 2019-09-24 ENCOUNTER — Telehealth: Payer: Self-pay

## 2019-09-24 DIAGNOSIS — F410 Panic disorder [episodic paroxysmal anxiety] without agoraphobia: Secondary | ICD-10-CM

## 2019-09-24 MED ORDER — ESCITALOPRAM OXALATE 20 MG PO TABS: 20.0000 mg | ORAL_TABLET | Freq: Every day | ORAL | 1 refills | Status: DC

## 2019-09-24 NOTE — Telephone Encounter (Signed)
Returned call to patient.  She reports she had a panic attack recently at work.  She has not been very compliant with her psychotherapy visits however she agrees to get back in therapy on a more regular basis.  Patient had an appointment with her primary care provider, I have reviewed primary care provider's notes.  Per PCP-patient likely drinking a lot of energy drinks as well as caffeinated drinks.  When this was discussed with patient she reports she has stopped using them.  Per PMD-propranolol increase.  Advised patient to make use of it.  Discussed IOP/PHP referral.  Patient however currently wants to just stay in individual therapy for now and be more regular with it.  Will increase Lexapro to 20 mg p.o. daily.

## 2019-09-24 NOTE — Telephone Encounter (Signed)
pt called left a message that she had a anxiety attack at work yesterday and that she was told by her employer that she needed to call you .

## 2019-09-28 ENCOUNTER — Ambulatory Visit (INDEPENDENT_AMBULATORY_CARE_PROVIDER_SITE_OTHER): Payer: PRIVATE HEALTH INSURANCE | Admitting: Licensed Clinical Social Worker

## 2019-09-28 ENCOUNTER — Other Ambulatory Visit: Payer: Self-pay

## 2019-09-28 DIAGNOSIS — F3162 Bipolar disorder, current episode mixed, moderate: Secondary | ICD-10-CM

## 2019-09-28 DIAGNOSIS — F411 Generalized anxiety disorder: Secondary | ICD-10-CM

## 2019-09-28 NOTE — Progress Notes (Signed)
Virtual Visit via Telephone Note   I connected with Carrie Burke on 09/28/19 at 2:00pm by telephone and verified that I am speaking with the correct person using two identifiers.   I discussed the limitations, risks, security and privacy concerns of performing an evaluation and management service by telephone and the availability of in person appointments. I also discussed with the patient that there may be a patient responsible charge related to this service. The patient expressed understanding and agreed to proceed.   I discussed the assessment and treatment plan with the patient. The patient was provided an opportunity to ask questions and all were answered. The patient agreed with the plan and demonstrated an understanding of the instructions.   The patient was advised to call back or seek an in-person evaluation if the symptoms worsen or if the condition fails to improve as anticipated.   I provided 30 minutes of non-face-to-face time during this encounter.                                                      [name], LCSW, LCAS                                                   _______________________                                                THERAPIST PROGRESS NOTE    Session Time: 2:00pm - 2:30pm  Location: Patient: Patient Home Provider: [facility] Office    Participation Level: Active   Behavioral Response: Alert, anxious mood   Type of Therapy:  Individual Therapy   Treatment Goals addressed: Medication Compliance; Self-care routine to manage depression; Communication in relationship; Anxiety management   Interventions: CBT   Summary: Carrie Burke is a 29 year old female that presented for telephone therapy session today with diagnoses of Generalized Anxiety Disorder and Bipolar I Disorder, mixed, moderate.     Suicidal/Homicidal: None; without plan or intent.     Therapist Response: Clinician spoke with Carrie Burke via telephone today due to her inability to access  video applications for therapy.  Clinician assessed for safety, sobriety, and medication compliance. Henli answered phone call for this appointment on time and spoke in a manner that was alert, oriented x5, with no evidence or self-report of SI/HI or A/V H.  [name] reported that she continues taking medication as prescribed and denied abuse of alcohol or illicit substances.  Clinician inquired about Carrie Burke's current emotional ratings, as well as any significant changes in thoughts, feelings, or behavior since last session.  Cristianna reported scores of 5/10 for depression and 8/10 for anxiety, and stated "I'm having panic attacks about 3 times a week right now".  Clinician inquired about Carrie Burke's present stressors and/or major transitions which could be influencing frequency of panic episodes, and how she has been attempting to cope with this.  Carrie Burke reported that "They are just coming out of the blue", and noted that her mother has history of similar issues, with little explanation for what  influences them.  Carrie Burke reported that nothing has changed at work or home which might have impacted stress levels.  Clinician revisited previous relaxation techniques (I.e. mindful breathing, grounding techniques, etc) which were presented in past appointments, and encouraged Carrie Burke to begin practicing these again regularly to help intervene when she recognizes telltale panic episodes signs (I.e. racing thoughts, lightheaded/dizzy, increased heart rate).  Clinician also recommended that Carrie Burke keep a journal or notepad around to document circumstances surrounding future anxiety episodes in order to gain more insight into possible triggers which are influencing anxiety at this time and effectiveness of strategies utilized to intervene during episodes.  Carrie Burke was agreeable these suggestions, and also noted that she would work to improve self-care in following weeks by going on vacation with family, reading more for  healthy distraction, and using husband for support when needed. Clinician will continue to monitor.       Plan: Follow up again in 1 week.    Diagnosis: Generalized Anxiety Disorder; Bipolar I Disorder, Mixed moderate   [name], [place], LCAS 09/28/19

## 2019-09-29 ENCOUNTER — Telehealth (INDEPENDENT_AMBULATORY_CARE_PROVIDER_SITE_OTHER): Payer: Self-pay | Admitting: Psychiatry

## 2019-09-29 ENCOUNTER — Other Ambulatory Visit: Payer: Self-pay

## 2019-09-29 DIAGNOSIS — F411 Generalized anxiety disorder: Secondary | ICD-10-CM

## 2019-10-04 ENCOUNTER — Other Ambulatory Visit: Payer: Self-pay

## 2019-10-04 ENCOUNTER — Ambulatory Visit (HOSPITAL_COMMUNITY): Payer: 59 | Admitting: Licensed Clinical Social Worker

## 2019-10-04 ENCOUNTER — Telehealth (HOSPITAL_COMMUNITY): Payer: Self-pay | Admitting: Licensed Clinical Social Worker

## 2019-10-04 NOTE — Telephone Encounter (Signed)
Stephanine had a virtual therapy appointment scheduled for today at 2pm.  Clinician contacted her by phone due to ongoing issues with accessing video application.  Macey answered phone call on time and spoke in a manner that was alert, oriented x5, with no evidence or self-report of SI/HI or A/V H.  Dorenda reported that she would need to reschedule appointment for another day due to something that had come up, and would call front desk later this afternoon.  Clinician will continue to monitor.    [name], LCSW, LCAS 10/04/19

## 2019-10-05 ENCOUNTER — Telehealth (INDEPENDENT_AMBULATORY_CARE_PROVIDER_SITE_OTHER): Payer: Self-pay | Admitting: Psychiatry

## 2019-10-05 ENCOUNTER — Other Ambulatory Visit: Payer: Self-pay

## 2019-10-05 DIAGNOSIS — Z5329 Procedure and treatment not carried out because of patient's decision for other reasons: Secondary | ICD-10-CM

## 2019-10-05 NOTE — Progress Notes (Signed)
No response to call or text or video invite  

## 2019-10-11 ENCOUNTER — Ambulatory Visit (INDEPENDENT_AMBULATORY_CARE_PROVIDER_SITE_OTHER): Payer: PRIVATE HEALTH INSURANCE | Admitting: Licensed Clinical Social Worker

## 2019-10-11 ENCOUNTER — Other Ambulatory Visit: Payer: Self-pay

## 2019-10-11 DIAGNOSIS — F3162 Bipolar disorder, current episode mixed, moderate: Secondary | ICD-10-CM

## 2019-10-11 DIAGNOSIS — F411 Generalized anxiety disorder: Secondary | ICD-10-CM

## 2019-10-11 NOTE — Progress Notes (Signed)
Virtual Visit via Telephone Note   I connected with Carrie Burke on 10/11/19 at 2:00pm by telephone and verified that I am speaking with the correct person using two identifiers.   I discussed the limitations, risks, security and privacy concerns of performing an evaluation and management service by telephone and the availability of in person appointments. I also discussed with the patient that there may be a patient responsible charge related to this service. The patient expressed understanding and agreed to proceed.   I discussed the assessment and treatment plan with the patient. The patient was provided an opportunity to ask questions and all were answered. The patient agreed with the plan and demonstrated an understanding of the instructions.   The patient was advised to call back or seek an in-person evaluation if the symptoms worsen or if the condition fails to improve as anticipated.   I provided 30 minutes of non-face-to-face time during this encounter.                                                      [name], LCSW, LCAS                                                   _______________________                                                THERAPIST PROGRESS NOTE    Session Time: 2:00pm - 2:30pm  Location: Patient: Inside patient's car at workplace Provider: [facility]    Participation Level: Active   Behavioral Response: Alert, anxious mood   Type of Therapy:  Individual Therapy   Treatment Goals addressed: Medication Compliance; Communication in relationship; Anxiety management; Employment and position transitions    Interventions: CBT, strengths based   Summary: Carrie Burke is a 29 year old female that presented for telephone therapy session today with diagnoses of Generalized Anxiety Disorder and Bipolar I Disorder, mixed, moderate.     Suicidal/Homicidal: None; without plan or intent.     Therapist Response: Clinician spoke with Mellie via telephone  today due to her ongoing inability to access video applications for therapy or meet in person due to scheduling conflicts with work schedule.  Clinician assessed for safety, sobriety, and medication compliance. Monicia answered phone call for session on time and spoke in a manner that was alert, oriented x5, with no evidence or self-report of SI/HI or A/V H.  Lexys reported ongoing compliance with medication and denied abuse of alcohol or illicit substances.  Clinician inquired about Annebelle's emotional ratings today, as well as any significant changes in thoughts, feelings, or behavior since previous session.  Ilse reported scores of 6/10 for depression and 9/10 for anxiety, and stated "I haven't had any panic attacks since last week, but work has been kind of crazy".  Clinician inquired about the nature of Damari's struggles with her job at this time, as well as what she has done so far to improve the situation.  Kerri reported that she requested to speak with  her supervisor about the lack of structure and unrealistic workloads being set last week, in addition to a change in overall attitude, "Like they were giving me the cold shoulder".  Alanea reported that while she has received some supportive feedback, she is now considering leaving in case the prolonged stress in this role continues to be a problem for her mental health.  Clinician explored less stressful, alternative job opportunities with [employer] for ensuring financial security that might also highlight personal strengths.  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  Jarielys reported that discussing this subject in session relaxed her a bit, and stated "It helped me realize that I can  go back to my job today, do the best I can, and I dont need to freak out because everything is going to turn out alright".  Clinician will continue to monitor.       Plan: Follow up again in 1 week.    Diagnosis: Generalized Anxiety Disorder; Bipolar I Disorder, Mixed moderate   [name], [place], LCAS 10/11/19

## 2019-11-15 ENCOUNTER — Telehealth: Payer: Self-pay | Admitting: Neurology

## 2019-11-15 NOTE — Progress Notes (Deleted)
    Virtual Visit via Video Note  I connected with Carrie Burke on 11/15/19 at  3:45 PM EDT by a video enabled telemedicine application and verified that I am speaking with the correct person using two identifiers.  Location: Patient: at her home Provider: in the office    I discussed the limitations of evaluation and management by telemedicine and the availability of in person appointments. The patient expressed understanding and agreed to proceed.  History of Present Illness: 11/15/2019 SS: Carrie Burke is a 29 year old female history of bipolar disorder and migraine headache.  She is on Aimovig, gabapentin, baclofen and Maxalt for acute headache.  MRI of the brain in March 2019 showed left CP angle mass, consistent with lipoma, encasing the 7th and 8th cranial nerves repeat MRI of the brain in June 2020 overall stable left CP angle lipoma, no evidence of mass-effect or midline shift.  Will monitor every 2 to 3 years.  05/11/2018 SS: Carrie Burke is a 29 year old female with history of bipolar disorder and migraine headache.  She is currently taking gabapentin 300 mg in the morning, 900 mg at bedtime.  She continues to complain of trouble with her gait, feeling off balance.  She denies any falls.  In the past she has been unable to do physical therapy due to not being able to get off work.  She reports daily headaches, gets worse throughout the day. She reports her headaches generally occur to the back of her head, down her neck.  She denies any nausea or vomiting.  She reports baclofen, and Maxalt are generally helpful.  She had MRI of the brain in March 2019, showing left CP angle mass consistent with lipoma that is encasing the 7th and 8th cranial nerves.  She has tried Topamax in the past, had side effect of tingling in her fingers.  She denies any changes to her medications or medical history.  She denies any new problems or concerns.   Observations/Objective:   Assessment and Plan: 1.   Intractable migraine headache  2.  Gait disturbance -Have recommended PT, has difficulty getting off work -Gait and balance, could be affected by left CP angle lipoma encasing the 7th and 8th cranial nerve  3.  Left CP angle lipoma -MRI of the brain in June 2020 overall stable left CP angle lipoma, no evidence of mass-effect or midline shift, will follow every 2 to 3 years or if symptoms warrant  Follow Up Instructions:    I discussed the assessment and treatment plan with the patient. The patient was provided an opportunity to ask questions and all were answered. The patient agreed with the plan and demonstrated an understanding of the instructions.   The patient was advised to call back or seek an in-person evaluation if the symptoms worsen or if the condition fails to improve as anticipated.  I provided 20 minutes of non-face-to-face time during this encounter.   Evangeline Dakin, DNP  [facility] [address], [place], [place] [address] [phone]

## 2019-12-17 ENCOUNTER — Other Ambulatory Visit: Payer: Self-pay | Admitting: Psychiatry

## 2019-12-17 DIAGNOSIS — F410 Panic disorder [episodic paroxysmal anxiety] without agoraphobia: Secondary | ICD-10-CM

## 2020-01-18 ENCOUNTER — Other Ambulatory Visit: Payer: Self-pay | Admitting: Psychiatry

## 2020-01-18 DIAGNOSIS — F410 Panic disorder [episodic paroxysmal anxiety] without agoraphobia: Secondary | ICD-10-CM

## 2020-02-14 ENCOUNTER — Other Ambulatory Visit: Payer: Self-pay | Admitting: Psychiatry

## 2020-02-14 DIAGNOSIS — F410 Panic disorder [episodic paroxysmal anxiety] without agoraphobia: Secondary | ICD-10-CM

## 2020-02-29 ENCOUNTER — Telehealth (INDEPENDENT_AMBULATORY_CARE_PROVIDER_SITE_OTHER): Payer: 59 | Admitting: Psychiatry

## 2020-02-29 ENCOUNTER — Other Ambulatory Visit: Payer: Self-pay

## 2020-02-29 ENCOUNTER — Encounter: Payer: Self-pay | Admitting: Psychiatry

## 2020-02-29 ENCOUNTER — Other Ambulatory Visit: Payer: Self-pay | Admitting: Psychiatry

## 2020-02-29 DIAGNOSIS — Z91148 Patient's other noncompliance with medication regimen for other reason: Secondary | ICD-10-CM

## 2020-02-29 DIAGNOSIS — F410 Panic disorder [episodic paroxysmal anxiety] without agoraphobia: Secondary | ICD-10-CM | POA: Diagnosis not present

## 2020-02-29 DIAGNOSIS — Z9114 Patient's other noncompliance with medication regimen: Secondary | ICD-10-CM

## 2020-02-29 DIAGNOSIS — Z79899 Other long term (current) drug therapy: Secondary | ICD-10-CM

## 2020-02-29 DIAGNOSIS — F3162 Bipolar disorder, current episode mixed, moderate: Secondary | ICD-10-CM

## 2020-02-29 DIAGNOSIS — F411 Generalized anxiety disorder: Secondary | ICD-10-CM | POA: Diagnosis not present

## 2020-02-29 DIAGNOSIS — G4700 Insomnia, unspecified: Secondary | ICD-10-CM

## 2020-02-29 DIAGNOSIS — F17200 Nicotine dependence, unspecified, uncomplicated: Secondary | ICD-10-CM

## 2020-02-29 MED ORDER — LORAZEPAM 0.5 MG PO TABS: 0.5000 mg | ORAL_TABLET | ORAL | 0 refills | Status: DC

## 2020-02-29 MED ORDER — ESCITALOPRAM OXALATE 20 MG PO TABS: ORAL_TABLET | ORAL | 0 refills | Status: DC

## 2020-02-29 MED ORDER — OXCARBAZEPINE 150 MG PO TABS: 150.0000 mg | ORAL_TABLET | Freq: Two times a day (BID) | ORAL | 0 refills | Status: DC

## 2020-02-29 MED ORDER — RAMELTEON 8 MG PO TABS
8.0000 mg | ORAL_TABLET | Freq: Every day | ORAL | 0 refills | Status: DC
Start: 2020-02-29 — End: 2020-03-14

## 2020-02-29 NOTE — Patient Instructions (Signed)
Oxcarbazepine tablets What is this medicine? OXCARBAZEPINE (ox car BAZ e peen) is used to treat people with epilepsy. It helps prevent partial seizures. This medicine may be used for other purposes; ask your health care provider or pharmacist if you have questions. COMMON BRAND NAME(S): Trileptal What should I tell my health care provider before I take this medicine? They need to know if you have any of these conditions:  Asian ancestry  kidney disease  liver disease  suicidal thoughts, plans, or attempt; a previous suicide attempt by you or a family member  any unusual or allergic reaction to oxcarbazepine, carbamazepine, other medicines, foods, dyes, or preservatives  pregnant or trying to get pregnant  breast-feeding How should I use this medicine? Take this medicine by mouth with a glass of water. Follow the directions on the prescription label. This medicine may be taken with or without food. Take your doses at regular intervals. Do not take your medicine more often than directed. Do not stop taking except on the advice of your doctor or health care professional. A special MedGuide will be given to you by the pharmacist with each prescription and refill. Be sure to read this information carefully each time. Talk to your pediatrician regarding the use of this medicine in children. While this medicine may be prescribed for children as young as 2 years for selected conditions, precautions do apply. Overdosage: If you think you have taken too much of this medicine contact a poison control center or emergency room at once. NOTE: This medicine is only for you. Do not share this medicine with others. What if I miss a dose? If you miss a dose, take it as soon as you can. If it is almost time for your next dose, take only that dose. Do not take double or extra doses. What may interact with this medicine? Do not take this medicine with any of the following medications:  carbamazepine This  medicine may also interact with the following medications:  birth control pills  certain medicines for seizures like phenobarbital, phenytoin, valproic acid  certain medicines for high blood pressure like felodipine, diltiazem, verapamil  cyclosporine This list may not describe all possible interactions. Give your health care provider a list of all the medicines, herbs, non-prescription drugs, or dietary supplements you use. Also tell them if you smoke, drink alcohol, or use illegal drugs. Some items may interact with your medicine. What should I watch for while using this medicine? Visit your doctor or health care provider for regular checks on your progress. Do not stop taking this medicine suddenly. This increases the risk of seizures. Wear a Probation officer or necklace. Carry an identification card with information about your condition, medications, and doctor or health care provider. This medicine may cause serious skin reactions. They can happen weeks to months after starting the medicine. Contact your health care provider right away if you notice fevers or flu-like symptoms with a rash. The rash may be red or purple and then turn into blisters or peeling of the skin. Or, you might notice a red rash with swelling of the face, lips or lymph nodes in your neck or under your arms. Rarely, serious skin allergic reactions may occur with this medicine. If you develop a skin rash, redness, itching, peeling skin inside your mouth, swollen glands, or a fever while taking this medicine, contact your health care provider immediately. You may get drowsy or dizzy. Do not drive, use machinery, or do anything that needs  mental alertness until you know how this drug affects you. Do not stand or sit up quickly, especially if you are an older patient. This reduces the risk of dizzy or fainting spells. Alcohol can make you more drowsy and dizzy. Avoid alcoholic drinks. Birth control pills may not work  properly while you are taking this medicine. Talk to your doctor about using an extra method of birth control. The use of this medicine may increase the chance of suicidal thoughts or actions. Pay special attention to how you are responding while on this medicine. Any worsening of mood, or thoughts of suicide or dying should be reported to your health care provider right away. Women who become pregnant while using this medicine may enroll in the [place] Pregnancy Registry by calling [id number]. This registry collects information about the safety of antiepileptic drug use during pregnancy. What side effects may I notice from receiving this medicine? Side effects that you should report to your doctor or health care professional as soon as possible:  allergic reactions such as skin rash or itching, hives, swelling of the lips, mouth, tongue, or throat  changes in vision  confusion  difficulty passing urine or change in the amount of urine  infection  nausea, vomiting  problems with balance, speaking, walking  rash, fever, and swollen lymph nodes  redness, blistering, peeling or loosening of the skin, including inside the mouth  swelling of feet, hands  unusual bleeding, bruising  unusually weak or tired  worsening of mood, thoughts or actions of suicide or dying  yellowing of eyes, skin Side effects that usually do not require medical attention (report to your doctor or health care professional if they continue or are bothersome):  constipation or diarrhea  headache  loss of appetite  nervous  stomach upset  tremors  trouble sleeping This list may not describe all possible side effects. Call your doctor for medical advice about side effects. You may report side effects to FDA at 1-800-FDA-1088. Where should I keep my medicine? Keep out of reach of children. Store at room temperature between 15 and 30 degrees C (59 and 86 degrees F). Keep  container tightly closed. Throw away any unused medicine after the expiration date. NOTE: This sheet is a summary. It may not cover all possible information. If you have questions about this medicine, talk to your doctor, pharmacist, or health care provider.  2021 Elsevier/Gold Standard (2018-04-08 10:55:30) Ramelteon Oral Tablets What is this medicine? RAMELTEON (ram EL tee on) is used to treat insomnia (trouble sleeping). This medicine helps you to fall asleep. This medicine may be used for other purposes; ask your health care provider or pharmacist if you have questions. COMMON BRAND NAME(S): Rozerem What should I tell my health care provider before I take this medicine? They need to know if you have any of these conditions:  history of alcohol or medicine abuse or addiction  liver disease  lung or breathing disease (asthma, COPD)  mental health disease  sleep apnea  suicidal thoughts, plans or attempt  an unusual or allergic reaction to ramelteon, other medicines, foods, dyes, or preservatives  pregnant or trying to get pregnant  breast-feeding How should I use this medicine? Take this medicine by mouth with water. Take it as directed on the prescription label and only when you are ready for bed. Do not cut or break this medicine. Swallow the tablets whole. Do not take it with or right after a meal. A special MedGuide  will be given to you by the pharmacist with each prescription and refill. Be sure to read this information carefully each time. Talk to your health care provider about the use of this medicine in children. It is not approved for use in children. Overdosage: If you think you have taken too much of this medicine contact a poison control center or emergency room at once. NOTE: This medicine is only for you. Do not share this medicine with others. What if I miss a dose? This does not apply. This medicine should only be taken immediately before going to sleep. Do not  take double or extra doses. What may interact with this medicine? Do not take this medicine with any of the following medications:  fluvoxamine  melatonin  tasimelteon  viloxazine This medicine may also interact with the following medications:  alcohol  certain medicines for fungal infections like ketoconazole, fluconazole, or itraconazole  ciprofloxacin  donepezil  doxepin  other medicines for sleep  rifampin This list may not describe all possible interactions. Give your health care provider a list of all the medicines, herbs, non-prescription drugs, or dietary supplements you use. Also tell them if you smoke, drink alcohol, or use illegal drugs. Some items may interact with your medicine. What should I watch for while using this medicine? Visit your health care provider for regular checks on your progress. Tell your health care provider if your symptoms do not start to get better or if they get worse. Do not take this medicine unless you are able to stay in bed for a full night (7 to 8 hours) before you must be active again. Do not stand or sit up quickly after taking this medicine, especially if you are an older patient. This reduces the risk of dizzy or fainting spells. You may still feel drowsy the next day after taking this medicine. Do not drive or do other dangerous activities until you feel fully awake. After taking this medicine, you may get up out of bed and do an activity that you do not know you are doing. The next morning, you may have no memory of this. Activities include driving a car ("sleep-driving"), making and eating food, talking on the phone, sexual activity, and sleep-walking. Serious injuries have occurred. Stop the medicine and call your doctor right away if you find out you have done any of these activities. Do not take this medicine if you have used alcohol that evening. Do not take it if you have taken another medicine for sleep. The risk of doing these  sleep-related activities is higher. If you or your family notice any changes in your behavior, such as new or worsening depression, thoughts of harming yourself, anxiety, other unusual or disturbing thoughts, or memory loss, call your health care provider right away. After you stop taking this medicine, you may have trouble falling asleep. This is called rebound insomnia. This problem usually goes away on its own after 1 or 2 nights. What side effects may I notice from receiving this medicine? Side effects that you should report to your doctor or health care professional as soon as possible:  allergic reactions (skin rash, itching or hives; swelling of the face, lips, or tongue)  abnormal production of milk  changes in sex drive or performance  hallucinations  missed monthly period (for women)  trouble breathing  unusual activities while not fully awake such as driving, eating, making phone calls, or sexual activity  vomiting Side effects that usually do not require  medical attention (report to your doctor or health care professional if they continue or are bothersome):  dizziness  drowsiness  headache  unusual dreams or nightmares  tiredness This list may not describe all possible side effects. Call your doctor for medical advice about side effects. You may report side effects to FDA at 1-800-FDA-1088. Where should I keep my medicine? Keep out of the reach of children and pets. Store at room temperature between 15 and 30 degrees C (59 and 86 degrees F). Protect from light and moisture. Keep the container tightly closed. Get rid of any unused medicine after the expiration date. To get rid of medicines that are no longer needed or have expired:  Take the medicine to a medicine take-back program. Check with your pharmacy or law enforcement to find a location.  If you cannot return the medicine, check the label or package insert to see if the medicine should be thrown out in the  garbage or flushed down the toilet. If you are not sure, ask your health care provider. If it is safe to put it in the trash, take the medicine out of the container. Mix the medicine with cat litter, dirt, coffee grounds, or other unwanted substance. Seal the mixture in a bag or container. Put it in the trash. NOTE: This sheet is a summary. It may not cover all possible information. If you have questions about this medicine, talk to your doctor, pharmacist, or health care provider.  2021 Elsevier/Gold Standard (2019-04-29 15:31:46)

## 2020-02-29 NOTE — Progress Notes (Signed)
Virtual Visit via Video Note  I connected with Carrie Burke on 02/29/20 at  3:30 PM EST by a video enabled telemedicine application and verified that I am speaking with the correct person using two identifiers.  Location Provider Location : [facility] Patient Location : Car  Participants: Patient , Provider    I discussed the limitations of evaluation and management by telemedicine and the availability of in person appointments. The patient expressed understanding and agreed to proceed.    I discussed the assessment and treatment plan with the patient. The patient was provided an opportunity to ask questions and all were answered. The patient agreed with the plan and demonstrated an understanding of the instructions.   The patient was advised to call back or seek an in-person evaluation if the symptoms worsen or if the condition fails to improve as anticipated.  [place] MD OP Progress Note  02/29/2020 9:35 PM Carrie Burke  MRN:  [id number]  Chief Complaint:  Chief Complaint    Follow-up     HPI: Carrie Burke is a 30 year old Caucasian female, married, employed, lives in [place], has a history of GAD, bipolar disorder, chronic migraine, hypertension, history of lipoma, alcohol abuse, tobacco use disorder was evaluated by telemedicine today.  Patient's last visit was in September 2021.  Patient today returns reporting she had lost her health insurance plan and hence has not been able to keep up with her appointments.  She also has been noncompliant with her medications.  The only medication that she is on right now is the Lexapro and the lorazepam as needed.  Patient reports she has noticed ups and downs in her mood.  She easily gets frustrated and agitated.  Patient also reports anxiety symptoms, panic attacks when she has shortness of breath, heart palpitation and feels extremely nervous.  This has been going on since the past few weeks and getting worse.  Patient  reports she also struggles with concentration problems and feels as though she cannot focus on her work.  She worries about probably having ADD.  Patient struggles with sadness, lack of motivation low energy and so on on a regular basis.  Patient reports she has these obsessions of cleaning things.  She reports if she has to clean anything she has to go back and clean it all over again.  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  She reports she repeats it all over again after she has cleaned it once and this is frustrating for her.  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  Patient reports sleep is restless.  She is up several times at night and sometimes she tries to compensate for it by sleeping during the day.  She tried an over-the-counter sleep medication, does not remember the name however that did not work .  She also did not do well on trazodone and mirtazapine in the past.  Patient reports she does not want to be on any medication that can cause weight gain.  She hence stopped the Geodon that was prescribed to her last visit.  Patient denies any suicidality, homicidality or perceptual disturbances.  Patient denies any other concerns today.  Visit Diagnosis:    ICD-10-CM   1. Bipolar 1 disorder, mixed, moderate (HCC)  F31.62   2. GAD (generalized anxiety disorder)  F41.1   3. Panic attacks  F41.0 escitalopram (LEXAPRO) 20 MG tablet    LORazepam (ATIVAN) 0.5 MG tablet  4. Insomnia, unspecified type  G47.00 OXcarbazepine (TRILEPTAL) 150 MG tablet    ramelteon (ROZEREM) 8 MG tablet  5. Tobacco use disorder  F17.200   6. High risk medication use  Z79.899 CBC With Diff/Platelet    Comprehensive metabolic panel  7. Noncompliance with medication regimen  Z91.14     Past Psychiatric History: I have reviewed past psychiatric history from  my progress note on 06/10/2018  Past Medical History:  Past Medical History:  Diagnosis Date  . Anxiety   . Bipolar disorder ([place])   . Common migraine with intractable migraine 03/18/2017  . Depression   . Elevated serum glutamic pyruvic transaminase (SGPT) level   . Gait abnormality 03/18/2017  . Gestational diabetes   . High cholesterol   . High-risk pregnancy 01/24/2014  . History of pre-eclampsia   . History of pre-eclampsia in prior pregnancy, currently pregnant in third trimester 01/24/2014  . Polyhydramnios in third trimester 02/11/2014  . Preeclampsia   . Vitamin D deficiency disease     Past Surgical History:  Procedure Laterality Date  . CLAVICLE SURGERY  2014  . TUBAL LIGATION      Family Psychiatric History: I have reviewed family psychiatric history from my progress note on 06/10/2018  Family History:  Family History  Problem Relation Age of Onset  . Arthritis Mother   . Breast cancer Mother 15  . Depression Mother   . Anxiety disorder Mother   . Heart disease Father   . Hyperlipidemia Father   . Hypertension Father   . Asthma Brother   . Breast cancer Maternal Grandmother   . Breast cancer Paternal Grandmother   . Bipolar disorder Paternal Uncle     Social History: Reviewed social history from my progress note on 06/10/2018 Social History   Socioeconomic History  . Marital status: Married    Spouse name: jame  . Number of children: 3  . Years of education: Not on file  . Highest education level: High school graduate  Occupational History  . Not on file  Tobacco Use  . Smoking status: Current Every Day Smoker    Packs/day: 1.00    Types: Cigarettes  . Smokeless tobacco: Never Used  Vaping Use  . Vaping Use: Never used  Substance and Sexual Activity  . Alcohol use: Yes    Comment: Occasionally  . Drug use: No  . Sexual activity: Yes    Birth control/protection: Surgical  Other Topics Concern  . Not on file  Social History Narrative   Lives with  husband and kids   Caffeine use: coffee daily   Right handed    Social Determinants of Health   Financial Resource Strain: Not on file  Food Insecurity: Not on file  Transportation Needs: Not on file  Physical Activity: Not on file  Stress: Not on file  Social Connections: Not on file    Allergies: No Known Allergies  Metabolic Disorder Labs: Lab Results  Component Value Date   HGBA1C 5.5 01/18/2019   No results found for: PROLACTIN Lab Results  Component Value Date   CHOL 268 (H) 01/18/2019   TRIG 635 (HH) 01/18/2019   HDL 43 01/18/2019   LDLCALC 113 (H) 01/18/2019   LDLCALC 102 (H) 02/27/2015   Lab Results  Component Value Date   TSH 3.250 01/18/2019   TSH 2.570 09/04/2017    Therapeutic  Level Labs: No results found for: LITHIUM No results found for: VALPROATE No components found for:  CBMZ  Current Medications: Current Outpatient Medications  Medication Sig Dispense Refill  . OXcarbazepine (TRILEPTAL) 150 MG tablet Take 1 tablet (150 mg total) by mouth 2 (two) times daily. For mood 60 tablet 0  . ramelteon (ROZEREM) 8 MG tablet Take 1 tablet (8 mg total) by mouth at bedtime. For sleep 30 tablet 0  . baclofen (LIORESAL) 10 MG tablet Take 0.5 tablets (5 mg total) by mouth 2 (two) times daily. 90 each 0  . cephALEXin (KEFLEX) 500 MG capsule Take 500 mg by mouth 2 (two) times daily.    . cloNIDine (CATAPRES) 0.1 MG tablet Take 0.1 mg by mouth daily as needed.    Marland Kitchen escitalopram (LEXAPRO) 20 MG tablet TAKE 1 TABLET(20 MG) BY MOUTH DAILY 90 tablet 0  . LORazepam (ATIVAN) 0.5 MG tablet Take 1 tablet (0.5 mg total) by mouth as directed. Take one tablet up to 2-3 times a week only for severe panic attacks 6 tablet 0  . losartan (COZAAR) 50 MG tablet Take 1 tablet (50 mg total) by mouth daily. 90 tablet 0  . propranolol (INDERAL) 20 MG tablet Take by mouth.    . rizatriptan (MAXALT) 10 MG tablet Take 1 tablet (10 mg total) by mouth 3 (three) times daily as needed for  migraine. 10 tablet 3   No current facility-administered medications for this visit.     Musculoskeletal: Strength & Muscle Tone: UTA Gait & Station: UTA Patient leans: N/A  Psychiatric Specialty Exam: Review of Systems  Psychiatric/Behavioral: Positive for decreased concentration, dysphoric mood and sleep disturbance. The patient is nervous/anxious.   All other systems reviewed and are negative.   There were no vitals taken for this visit.There is no height or weight on file to calculate BMI.  General Appearance: Casual  Eye Contact:  Fair  Speech:  Clear and Coherent  Volume:  Normal  Mood:  Anxious, Depressed and Irritable  Affect:  Congruent  Thought Process:  Goal Directed and Descriptions of Associations: Intact  Orientation:  Full (Time, Place, and Person)  Thought Content: Logical   Suicidal Thoughts:  No  Homicidal Thoughts:  No  Memory:  Immediate;   Fair Recent;   Fair Remote;   Fair  Judgement:  Fair  Insight:  Fair  Psychomotor Activity:  Normal  Concentration:  Concentration: Fair and Attention Span: Fair  Recall:  [employer] of Knowledge: Fair  Language: Fair  Akathisia:  No  Handed:  Right  AIMS (if indicated): UTA  Assets:  Communication Skills Desire for Improvement Housing Social Support  ADL's:  Intact  Cognition: WNL  Sleep:  Poor   Screenings: GAD-7   Flowsheet Row Office Visit from 02/05/2019 in [place] Visit from 01/18/2019 in [facility] Office Visit from 11/30/2018 in [facility] Visit from 05/08/2018 in [facility] Visit from 04/03/2018 in [facility]  Total GAD-7 Score 11 13 18 5 4     PHQ2-9   Flowsheet Row Video Visit from 02/29/2020 in [facility] Counselor from 06/07/2019 in [facility] Office Visit from 02/05/2019 in [place] Visit from 01/18/2019 in [place] Visit from 11/30/2018 in [facility]  PHQ-2 Total Score 2 5 2 2 4   PHQ-9 Total Score 15 19 11 12 17        Assessment and  Plan: Choua Ikner is a 30 year old Caucasian female, employed, lives in [place], married, has a history of migraine headaches, hypertension, lipoma, bipolar disorder, GAD, panic attacks, tobacco use disorder was evaluated by telemedicine today.  Patient is biologically predisposed given her family history of mental health problems, her own medical issues.  Patient with psychosocial stressors of the current pandemic as well as health issues.  Patient has been noncompliant with medication regiment, discussed plan as noted below.  Plan Bipolar disorder-unstable Start Trileptal 100 mg p.o. twice daily Discontinue Geodon for noncompliance  GAD-unstable Continue Lexapro 10 mg p.o. daily Continue Ativan 0.5 mg as needed-we will provide a short supply of 6 pills.  Patient advised to limit use. Discussed with patient the importance of restarting therapy, she will get in touch with Mr. Chryl Heck.  Panic attacks-unstable Continue Lexapro 10 mg p.o. daily Restart Ativan 0.5 mg as needed for severe panic symptoms only Restart CBT  Insomnia-unstable Start Rozerem 8 mg p.o. nightly Discussed sleep hygiene techniques  Tobacco use disorder-unstable Provide smoking cessation counseling  High risk medication use-we will order CBC with differential, CMP.  Patient will go to [facility] lab.  Patient is noncompliant with treatment/medication recommendation-provided education.  Encouraged compliance.  Follow-up in clinic in 1 week or sooner if needed.  I have spent atleast 30 minutes face to face by video with patient today. More than 50 % of the time was spent for preparing to see the patient ( e.g., review of test, records ), obtaining and to review and separately obtained history , ordering medications and test ,psychoeducation and supportive  psychotherapy and care coordination,as well as documenting clinical information in electronic health record. This note was generated in part or whole with voice recognition software. Voice recognition is usually quite accurate but there are transcription errors that can and very often do occur. I apologize for any typographical errors that were not detected and corrected.        Ursula Alert, MD 02/29/2020, 9:35 PM

## 2020-03-01 ENCOUNTER — Telehealth: Payer: Self-pay

## 2020-03-01 NOTE — Telephone Encounter (Signed)
faxed and confirmed that orders were sent to [facility] lab ordered : cbc with diff/platelet CMP dx: z79.889

## 2020-03-13 DIAGNOSIS — R7303 Prediabetes: Secondary | ICD-10-CM | POA: Insufficient documentation

## 2020-03-14 ENCOUNTER — Telehealth (INDEPENDENT_AMBULATORY_CARE_PROVIDER_SITE_OTHER): Payer: 59 | Admitting: Psychiatry

## 2020-03-14 ENCOUNTER — Encounter: Payer: Self-pay | Admitting: Psychiatry

## 2020-03-14 ENCOUNTER — Other Ambulatory Visit: Payer: Self-pay

## 2020-03-14 DIAGNOSIS — F17200 Nicotine dependence, unspecified, uncomplicated: Secondary | ICD-10-CM

## 2020-03-14 DIAGNOSIS — G4700 Insomnia, unspecified: Secondary | ICD-10-CM

## 2020-03-14 DIAGNOSIS — F410 Panic disorder [episodic paroxysmal anxiety] without agoraphobia: Secondary | ICD-10-CM | POA: Diagnosis not present

## 2020-03-14 DIAGNOSIS — F3162 Bipolar disorder, current episode mixed, moderate: Secondary | ICD-10-CM

## 2020-03-14 DIAGNOSIS — F411 Generalized anxiety disorder: Secondary | ICD-10-CM

## 2020-03-14 DIAGNOSIS — Z9114 Patient's other noncompliance with medication regimen: Secondary | ICD-10-CM

## 2020-03-14 DIAGNOSIS — Z79899 Other long term (current) drug therapy: Secondary | ICD-10-CM

## 2020-03-14 MED ORDER — RAMELTEON 8 MG PO TABS: 8.0000 mg | ORAL_TABLET | Freq: Every day | ORAL | 1 refills | Status: DC

## 2020-03-14 NOTE — Progress Notes (Signed)
Virtual Visit via Video Note  I connected with Carrie Burke on 03/14/20 at  1:00 PM EST by a video enabled telemedicine application and verified that I am speaking with the correct person using two identifiers.  Location Provider Location : [facility] Patient Location : [facility]  Participants: Patient , Provider    I discussed the limitations of evaluation and management by telemedicine and the availability of in person appointments. The patient expressed understanding and agreed to proceed.   I discussed the assessment and treatment plan with the patient. The patient was provided an opportunity to ask questions and all were answered. The patient agreed with the plan and demonstrated an understanding of the instructions.   The patient was advised to call back or seek an in-person evaluation if the symptoms worsen or if the condition fails to improve as anticipated.  [place] MD OP Progress Note  03/14/2020 1:27 PM Carrie Burke  MRN:  [id number]  Chief Complaint:  Chief Complaint    Follow-up     HPI: Carrie Burke is a 30 year old Caucasian female, married, employed, lives in [place], has a history of GAD, bipolar disorder, chronic migraine, hypertension, history of lipoma, alcohol abuse, tobacco use disorder was evaluated by telemedicine today.  Patient today reports she is currently sleeping better on the Rozerem.  She denies side effects.  She however reports she continues to have mood swings, irritability.  She reports it does have an impact on her functioning however she has not had any trouble at work and has been able to cope.  She continues to struggle with concentration problems.  She is unable to focus on one thing at a time and this has been going on for a while.  Sleeping better has not improved her concentration problems.  Patient denies any suicidality, homicidality or perceptual disturbances.  Patient reports she has not been able to get her labs as  discussed last visit.  She reports she has been busy at work and has been unable to get a chance to do that.  She however agrees to get it done.  Patient denies any other concerns today.  Visit Diagnosis:    ICD-10-CM   1. Bipolar 1 disorder, mixed, moderate (HCC)  F31.62   2. GAD (generalized anxiety disorder)  F41.1   3. Panic attacks  F41.0   4. Insomnia, unspecified type  G47.00 ramelteon (ROZEREM) 8 MG tablet  5. Tobacco use disorder  F17.200   6. High risk medication use  Z79.899   7. Noncompliance with medication regimen  Z91.14     Past Psychiatric History: I have reviewed past psychiatric history from my progress note on 06/10/2018  Past Medical History:  Past Medical History:  Diagnosis Date   Anxiety    Bipolar disorder ([place])    Common migraine with intractable migraine 03/18/2017   Depression    Elevated serum glutamic pyruvic transaminase (SGPT) level    Gait abnormality 03/18/2017   Gestational diabetes    High cholesterol    High-risk pregnancy 01/24/2014   History of pre-eclampsia    History of pre-eclampsia in prior pregnancy, currently pregnant in third trimester 01/24/2014   Polyhydramnios in third trimester 02/11/2014   Preeclampsia    Vitamin D deficiency disease     Past Surgical History:  Procedure Laterality Date   CLAVICLE SURGERY  2014   TUBAL LIGATION      Family Psychiatric History: I have reviewed family psychiatric history from my progress note on 06/10/2018  Family History:  Family History  Problem Relation Age of Onset   Arthritis Mother    Breast cancer Mother 110   Depression Mother    Anxiety disorder Mother    Heart disease Father    Hyperlipidemia Father    Hypertension Father    Asthma Brother    Breast cancer Maternal Grandmother    Breast cancer Paternal Grandmother    Bipolar disorder Paternal Uncle     Social History: Reviewed social history from my progress note on 06/10/2018 Social History    Socioeconomic History   Marital status: Married    Spouse name: jame   Number of children: 3   Years of education: Not on file   Highest education level: High school graduate  Occupational History   Not on file  Tobacco Use   Smoking status: Current Every Day Smoker    Packs/day: 1.00    Types: Cigarettes   Smokeless tobacco: Never Used  Vaping Use   Vaping Use: Never used  Substance and Sexual Activity   Alcohol use: Yes    Comment: Occasionally   Drug use: No   Sexual activity: Yes    Birth control/protection: Surgical  Other Topics Concern   Not on file  Social History Narrative   Lives with husband and kids   Caffeine use: coffee daily   Right handed    Social Determinants of Health   Financial Resource Strain: Not on file  Food Insecurity: Not on file  Transportation Needs: Not on file  Physical Activity: Not on file  Stress: Not on file  Social Connections: Not on file    Allergies: No Known Allergies  Metabolic Disorder Labs: Lab Results  Component Value Date   HGBA1C 5.5 01/18/2019   No results found for: PROLACTIN Lab Results  Component Value Date   CHOL 268 (H) 01/18/2019   TRIG 635 (HH) 01/18/2019   HDL 43 01/18/2019   LDLCALC 113 (H) 01/18/2019   LDLCALC 102 (H) 02/27/2015   Lab Results  Component Value Date   TSH 3.250 01/18/2019   TSH 2.570 09/04/2017    Therapeutic Level Labs: No results found for: LITHIUM No results found for: VALPROATE No components found for:  CBMZ  Current Medications: Current Outpatient Medications  Medication Sig Dispense Refill   baclofen (LIORESAL) 10 MG tablet Take 0.5 tablets (5 mg total) by mouth 2 (two) times daily. 90 each 0   cephALEXin (KEFLEX) 500 MG capsule Take 500 mg by mouth 2 (two) times daily.     cloNIDine (CATAPRES) 0.1 MG tablet Take 0.1 mg by mouth daily as needed.     doxycycline (VIBRA-TABS) 100 MG tablet Take 100 mg by mouth 2 (two) times daily.     escitalopram  (LEXAPRO) 20 MG tablet TAKE 1 TABLET(20 MG) BY MOUTH DAILY 90 tablet 0   LORazepam (ATIVAN) 0.5 MG tablet Take 1 tablet (0.5 mg total) by mouth as directed. Take one tablet up to 2-3 times a week only for severe panic attacks 6 tablet 0   losartan (COZAAR) 50 MG tablet Take 1 tablet (50 mg total) by mouth daily. 90 tablet 0   OXcarbazepine (TRILEPTAL) 150 MG tablet TAKE 1 TABLET(150 MG) BY MOUTH TWICE DAILY FOR MOOD 60 tablet 0   propranolol (INDERAL) 20 MG tablet Take by mouth.     ramelteon (ROZEREM) 8 MG tablet Take 1 tablet (8 mg total) by mouth at bedtime. For sleep 30 tablet 1   rizatriptan (MAXALT) 10 MG tablet  Take 1 tablet (10 mg total) by mouth 3 (three) times daily as needed for migraine. 10 tablet 3   No current facility-administered medications for this visit.     Musculoskeletal: Strength & Muscle Tone: UTA Gait & Station: UTA Patient leans: N/A  Psychiatric Specialty Exam: Review of Systems  Psychiatric/Behavioral: Positive for decreased concentration. The patient is nervous/anxious.        Mood swings  All other systems reviewed and are negative.   There were no vitals taken for this visit.There is no height or weight on file to calculate BMI.  General Appearance: Casual  Eye Contact:  Fair  Speech:  Clear and Coherent  Volume:  Normal  Mood:  Anxious and Irritable  Affect:  Congruent  Thought Process:  Goal Directed and Descriptions of Associations: Intact  Orientation:  Full (Time, Place, and Person)  Thought Content: Logical   Suicidal Thoughts:  No  Homicidal Thoughts:  No  Memory:  Immediate;   Fair Recent;   Fair Remote;   Fair  Judgement:  Fair  Insight:  Shallow  Psychomotor Activity:  Normal  Concentration:  Concentration: Fair and Attention Span: Fair  Recall:  [employer] of Knowledge: Fair  Language: Fair  Akathisia:  No  Handed:  Right  AIMS (if indicated): UTA  Assets:  Communication Skills Desire for Improvement Housing Social  Support  ADL's:  Intact  Cognition: WNL  Sleep:  Improving   Screenings: GAD-7   Flowsheet Row Office Visit from 02/05/2019 in [facility] Visit from 01/18/2019 in [place] Visit from 11/30/2018 in [facility] Visit from 05/08/2018 in [place] Visit from 04/03/2018 in [place]  Total GAD-7 Score 11 13 18 5 4     PHQ2-9   Flowsheet Row Video Visit from 03/14/2020 in [place] Video Visit from 02/29/2020 in [place] Counselor from 06/07/2019 in [place] Office Visit from 02/05/2019 in [facility] Visit from 01/18/2019 in [facility]  PHQ-2 Total Score 2 2 5 2 2   PHQ-9 Total Score 4 15 19 11 12     Flowsheet Row Video Visit from 03/14/2020 in [place] No Risk       Assessment and Plan: Carrie Burke is a 30 year old Caucasian female, employed, lives in [place], married, has a history of migraine headaches, hypertension, lipoma, bipolar disorder, GAD, panic attacks, tobacco use disorder was evaluated by telemedicine today.  Patient is biologically predisposed given her family history of mental health problems, her own medical problems.  Patient with psychosocial stressors at the current pandemic continues to be noncompliant with treatment recommendations.  She continues to struggle with mood lability and concentration problems.  Plan as noted below.  Plan  Bipolar disorder-unstable Continue Trileptal 150 mg p.o. twice daily We will consider increasing her Trileptal however will wait for pending labs prior to doing that.  This was discussed with patient.  Patient to get it done in the next couple of days.  GAD-unstable Continue Lexapro 10 mg p.o. daily Patient has been noncompliant with psychotherapy  recommendations.  Provided education.  Restart CBT Continue Ativan 0.5 mg as needed-limit use.  Panic attacks-unstable Lexapro 10 mg p.o. daily Patient encouraged to restart CBT-she has been noncompliant  Insomnia-stable Rozerem 8 mg p.o. nightly  High risk medication use-pending labs-CBC with differential, CMP-she will go to [facility] lab in the  next 2 days.  Noncompliance with treatment recommendation-patient has been noncompliant with psychotherapy recommendations as well as labs.  Encouraged compliance, discussed clinic policy.  Follow-up in clinic in person on April 5 at 1 PM.   I have spent atleast 20  minutes with patient today. More than 50 % of the time was spent for preparing to see the patient ( e.g., review of test, records ), ordering medications and test ,psychoeducation and supportive psychotherapy and care coordination,as well as documenting clinical information in electronic health record. This note was generated in part or whole with voice recognition software. Voice recognition is usually quite accurate but there are transcription errors that can and very often do occur. I apologize for any typographical errors that were not detected and corrected.     Ursula Alert, MD 03/15/2020, 8:59 AM

## 2020-04-01 ENCOUNTER — Other Ambulatory Visit: Payer: Self-pay | Admitting: Psychiatry

## 2020-04-01 DIAGNOSIS — G4700 Insomnia, unspecified: Secondary | ICD-10-CM

## 2020-04-18 ENCOUNTER — Ambulatory Visit: Payer: 59 | Admitting: Psychiatry

## 2020-04-18 ENCOUNTER — Other Ambulatory Visit: Payer: Self-pay

## 2020-05-04 ENCOUNTER — Other Ambulatory Visit: Payer: Self-pay | Admitting: Psychiatry

## 2020-05-04 DIAGNOSIS — F3162 Bipolar disorder, current episode mixed, moderate: Secondary | ICD-10-CM

## 2020-05-04 DIAGNOSIS — F411 Generalized anxiety disorder: Secondary | ICD-10-CM

## 2020-07-03 ENCOUNTER — Other Ambulatory Visit: Payer: Self-pay | Admitting: Psychiatry

## 2020-07-03 DIAGNOSIS — G4700 Insomnia, unspecified: Secondary | ICD-10-CM

## 2020-07-03 NOTE — Telephone Encounter (Signed)
Patient noncompliant with labs and follow up

## 2021-01-09 NOTE — Progress Notes (Signed)
error 

## 2021-03-08 ENCOUNTER — Other Ambulatory Visit: Payer: Self-pay

## 2021-03-08 ENCOUNTER — Ambulatory Visit: Payer: Managed Care, Other (non HMO) | Admitting: Dermatology

## 2021-03-08 DIAGNOSIS — F419 Anxiety disorder, unspecified: Secondary | ICD-10-CM

## 2021-03-08 DIAGNOSIS — R262 Difficulty in walking, not elsewhere classified: Secondary | ICD-10-CM | POA: Diagnosis not present

## 2021-03-08 DIAGNOSIS — R21 Rash and other nonspecific skin eruption: Secondary | ICD-10-CM

## 2021-03-08 DIAGNOSIS — L920 Granuloma annulare: Secondary | ICD-10-CM

## 2021-03-08 MED ORDER — CLOBETASOL PROPIONATE 0.05 % EX OINT: TOPICAL_OINTMENT | CUTANEOUS | 0 refills | Status: DC

## 2021-03-08 NOTE — Patient Instructions (Addendum)
Topical steroids (such as triamcinolone, fluocinolone, fluocinonide, mometasone, clobetasol, halobetasol, betamethasone, hydrocortisone) can cause thinning and lightening of the skin if they are used for too long in the same area. Your physician has selected the right strength medicine for your problem and area affected on the body. Please use your medication only as directed by your physician to prevent side effects.   If You Need Anything After Your Visit  If you have any questions or concerns for your doctor, please call our main line at [phone] and press option 4 to reach your doctor's medical assistant. If no one answers, please leave a voicemail as directed and we will return your call as soon as possible. Messages left after 4 pm will be answered the following business day.   You may also send [place] a message via [place]. We typically respond to MyChart messages within 1-2 business days.  For prescription refills, please ask your pharmacy to contact our office. Our fax number is [phone].  If you have an urgent issue when the clinic is closed that cannot wait until the next business day, you can page your doctor at the number below.    Please note that while we do our best to be available for urgent issues outside of office hours, we are not available 24/7.   If you have an urgent issue and are unable to reach [place], you may choose to seek medical care at your doctor's office, retail clinic, urgent care center, or emergency room.  If you have a medical emergency, please immediately call 911 or go to the emergency department.  Pager Numbers  - Dr. Nehemiah Massed: [phone]  - Dr. Laurence Ferrari: [phone]  - Dr. Nicole Kindred: [phone]  In the event of inclement weather, please call our main line at [phone] for an update on the status of any delays or closures.  Dermatology Medication Tips: Please keep the boxes that topical medications come in in order to help keep track of the  instructions about where and how to use these. Pharmacies typically print the medication instructions only on the boxes and not directly on the medication tubes.   If your medication is too expensive, please contact our office at [phone] option 4 or send [place] a message through [place].   We are unable to tell what your co-pay for medications will be in advance as this is different depending on your insurance coverage. However, we may be able to find a substitute medication at lower cost or fill out paperwork to get insurance to cover a needed medication.   If a prior authorization is required to get your medication covered by your insurance company, please allow [place] 1-2 business days to complete this process.  Drug prices often vary depending on where the prescription is filled and some pharmacies may offer cheaper prices.  The website www.goodrx.com contains coupons for medications through different pharmacies. The prices here do not account for what the cost may be with help from insurance (it may be cheaper with your insurance), but the website can give you the price if you did not use any insurance.  - You can print the associated coupon and take it with your prescription to the pharmacy.  - You may also stop by our office during regular business hours and pick up a GoodRx coupon card.  - If you need your prescription sent electronically to a different pharmacy, notify our office through [facility] or by phone at [phone] option 4.  Si Usted Necesita Algo Despus de Su Visita  Tambin puede enviarnos un mensaje a travs de Pharmacist, community. Por lo general respondemos a los mensajes de MyChart en el transcurso de 1 a 2 das hbiles.  Para renovar recetas, por favor pida a su farmacia que se ponga en contacto con nuestra oficina. Harland Dingwall de fax es [place] [phone].  Si tiene un asunto urgente cuando la clnica est cerrada y que no puede esperar hasta el siguiente da hbil,  puede llamar/localizar a su doctor(a) al nmero que aparece a continuacin.   Por favor, tenga en cuenta que aunque hacemos todo lo posible para estar disponibles para asuntos urgentes fuera del horario de [place], no estamos disponibles las 24 horas del da, los 7 das de la [employer].   Si tiene un problema urgente y no puede comunicarse con nosotros, puede optar por buscar atencin mdica  en el consultorio de su doctor(a), en una clnica privada, en un centro de atencin urgente o en una sala de emergencias.  Si tiene Engineering geologist, por favor llame inmediatamente al 911 o vaya a la sala de emergencias.  Nmeros de bper  - Dr. Nehemiah Massed: [phone]  - Dra. Moye: [phone]  - Dra. Nicole Kindred: [phone]  En caso de inclemencias del [place], por favor llame a Johnsie Kindred principal al [phone] para una actualizacin sobre el [place] de cualquier retraso o cierre.  Consejos para la medicacin en dermatologa: Por favor, guarde las cajas en las que vienen los medicamentos de uso tpico para ayudarle a seguir las instrucciones sobre dnde y cmo usarlos. Las farmacias generalmente imprimen las instrucciones del medicamento slo en las cajas y no directamente en los tubos del [place].   Si su medicamento es muy caro, por favor, pngase en contacto con Zigmund Daniel llamando al [phone] y presione la opcin 4 o envenos un mensaje a travs de Pharmacist, community.   No podemos decirle cul ser su copago por los medicamentos por adelantado ya que esto es diferente dependiendo de la cobertura de su seguro. Sin embargo, es posible que podamos encontrar un medicamento sustituto a Electrical engineer un formulario para que el seguro cubra el medicamento que se considera necesario.   Si se requiere una autorizacin previa para que su compaa de seguros [place] su medicamento, por favor permtanos de 1 a 2 das hbiles para completar este proceso.  Los precios de los medicamentos varan con  frecuencia dependiendo del Environmental consultant de dnde se surte la receta y alguna farmacias pueden ofrecer precios ms baratos.  El sitio web www.goodrx.com tiene cupones para medicamentos de Airline pilot. Los precios aqu no tienen en cuenta lo que podra costar con la ayuda del seguro (puede ser ms barato con su seguro), pero el sitio web puede darle el precio si no utiliz Research scientist (physical sciences).  - Puede imprimir el cupn correspondiente y llevarlo con su receta a la farmacia.  - Tambin puede pasar por nuestra oficina durante el horario de atencin regular y Charity fundraiser una tarjeta de cupones de GoodRx.  - Si necesita que su receta se enve electrnicamente a una farmacia diferente, informe a nuestra oficina a travs de MyChart de [place] o por telfono llamando al [phone] y presione la opcin 4.

## 2021-03-08 NOTE — Progress Notes (Signed)
New Patient Visit  Subjective  Carrie Burke is a 31 y.o. female who presents for the following: Rash (On the legs x 1.5 years - patient was prescribed a topical prescription by her PCP but she is unsure of the name of it. It didn't help improve condition. She also tried OTC psoriasis cream which burned and stung her skin. ).   The following portions of the chart were reviewed this encounter and updated as appropriate:   Tobacco   Allergies   Meds   Problems   Med Hx   Surg Hx   Fam Hx       Review of Systems:  No other skin or systemic complaints except as noted in HPI or Assessment and Plan.  Objective  Well appearing patient in no apparent distress; mood and affect are within normal limits.  A focused examination was performed including B/L leg. Relevant physical exam findings are noted in the Assessment and Plan.  B/L leg Multiple smooth annular pink to vilacious plaques primarily knees and feet            B/L pretibia Scaly red excoriated plaques on the B/L pretibia.          Assessment & Plan  Granuloma annulare B/L leg  Chronic and persistent condition with duration over one year. Condition is bothersome/symptomatic for patient. Currently flared.  Exam clinically consistent with Disseminated GA - Granuloma annulare is a chronic benign skin condition characterized by pink smooth bumps or ring-like plaques most commonly appearing over the joints and the backs of the hands/feet. Its cause is not known, and most episodes of granuloma annulare clear up after a few years, with or without treatment.  Start Clobetasol 0.05% ointment BID up to two weeks. Topical steroids (such as triamcinolone, fluocinolone, fluocinonide, mometasone, clobetasol, halobetasol, betamethasone, hydrocortisone) can cause thinning and lightening of the skin if they are used for too long in the same area. Your physician has selected the right strength medicine for your problem and  area affected on the body. Please use your medication only as directed by your physician to prevent side effects.   Discussed biopsy to confirm diagnosis and possible Isotretinoin or Plaquenil treatment if not improving with topical treatment. If improving will switch to a non-steroid topical such as tacrolimus.    Rash B/L pretibia  Ddx allergic contact dermatitis vs eczema vs possible stasis changes vs psoriasis -   Start Clobetasol 0.05% ointment to aa's BID up to three weeks. Topical steroids (such as triamcinolone, fluocinolone, fluocinonide, mometasone, clobetasol, halobetasol, betamethasone, hydrocortisone) can cause thinning and lightening of the skin if they are used for too long in the same area. Your physician has selected the right strength medicine for your problem and area affected on the body. Please use your medication only as directed by your physician to prevent side effects.   If not responding to topical treatment consider biopsy.   clobetasol ointment (TEMOVATE) 0.05 % - B/L pretibia Apply to rash BID up to two weeks. Avoid applying to face, groin, and axilla. Use as directed. Long-term use can cause thinning of the skin.  Difficulty walking B/L leg  Patient c/o difficulty walking and getting her legs to move at times. Advised patient to f/u with her PCP for further evaluation  Anxiety Internal  Patient reports anxiety. Recommend follow up with PCP.  Briefly discussed SSRI's and cognitive behavioral therapy. Pt already on escitalopram.    Return for rash follow up in 3-4 weeks.  Luther Redo, CMA, am acting as scribe for Forest Gleason, MD .  Documentation: I have reviewed the above documentation for accuracy and completeness, and I agree with the above.  Forest Gleason, MD

## 2021-03-12 ENCOUNTER — Encounter: Payer: Self-pay | Admitting: Dermatology

## 2021-03-17 IMAGING — CR DG CHEST 2V
1 series · 2 of 2 positions shown · non-contrast
Comparison: April 03, 2011

CLINICAL DATA: Chest pain and shortness of breath

EXAM:
CHEST - 2 VIEW

[Series 1: dg chest 2 view · 0.14mm/px · 2 of 2 slices shown]
[im 1/2]
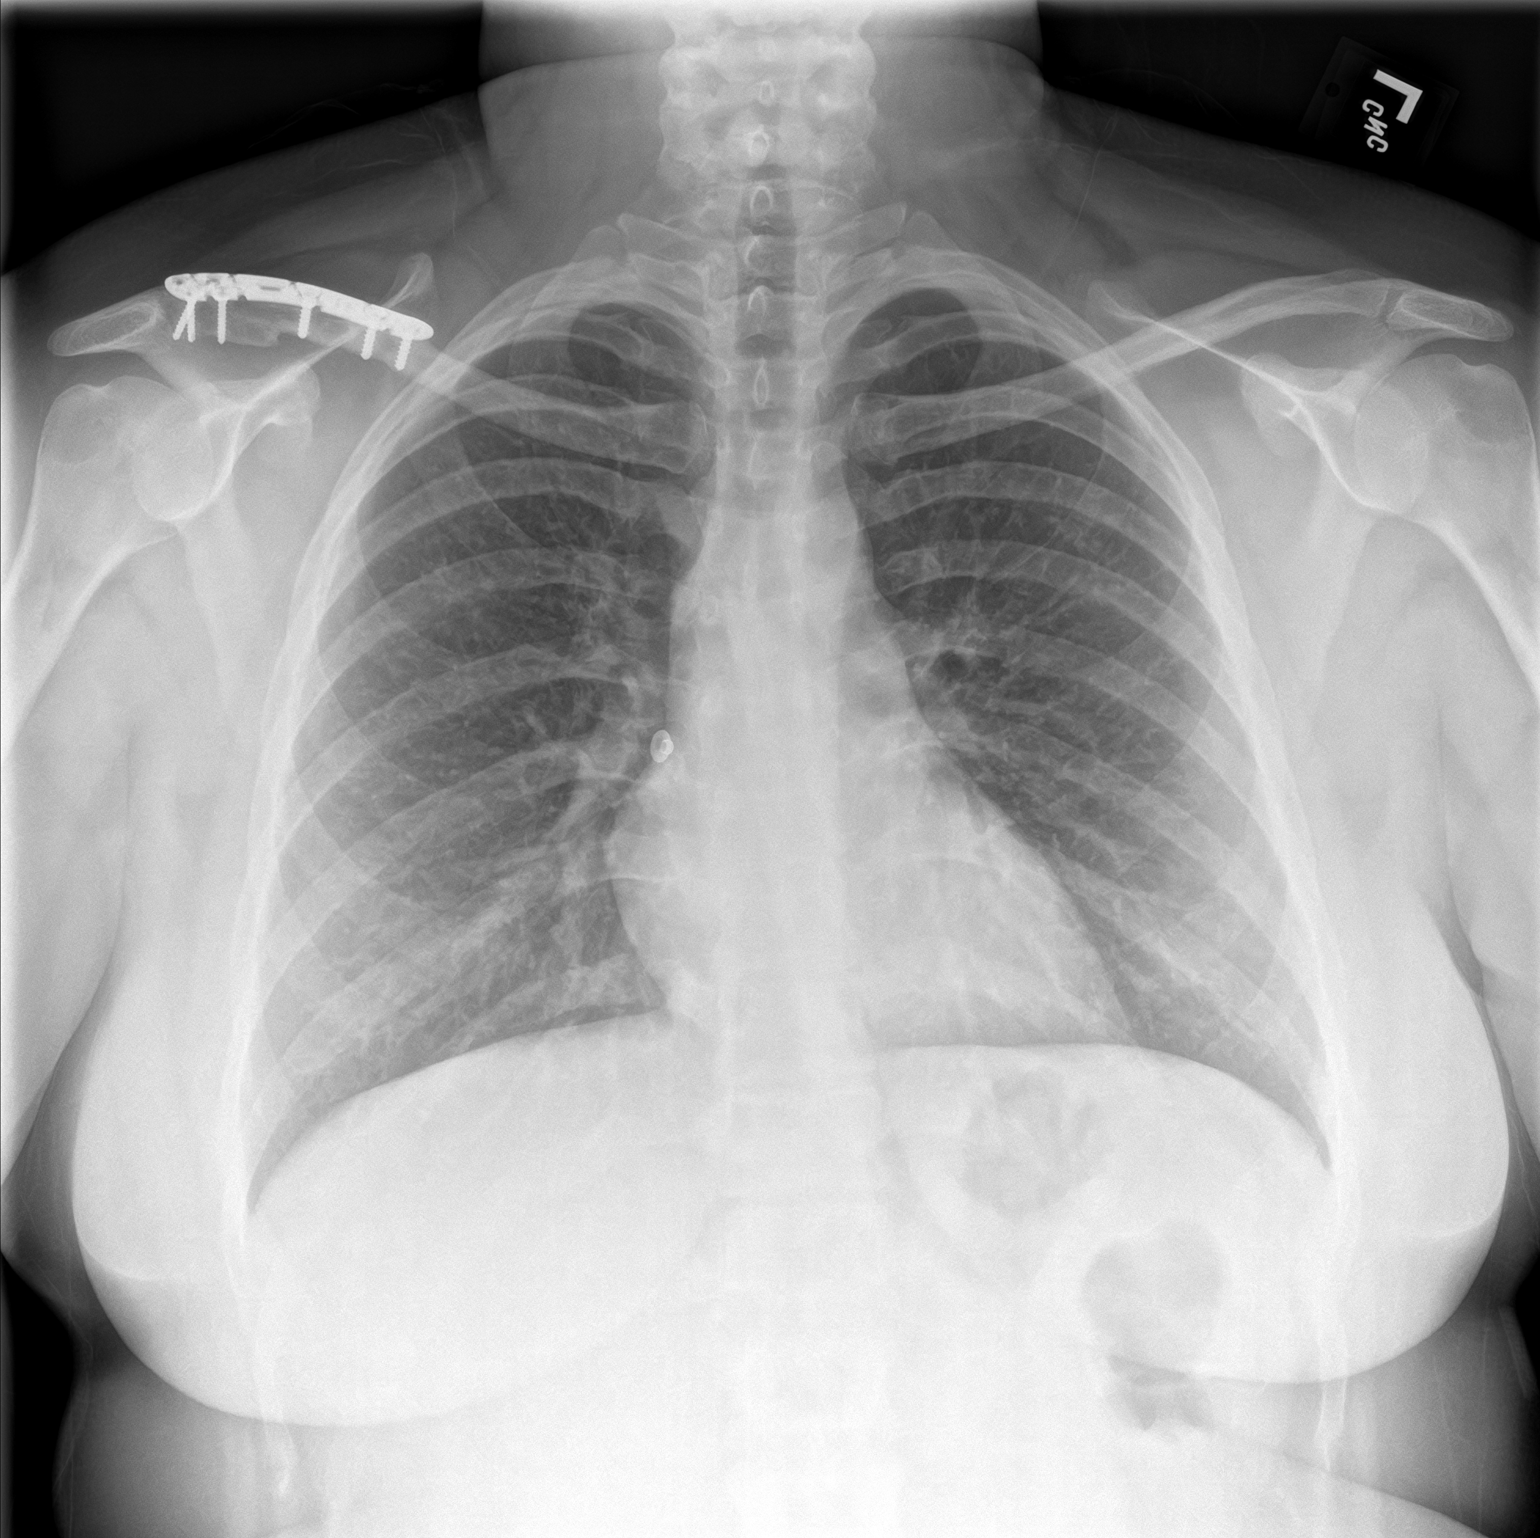
[im 2/2]
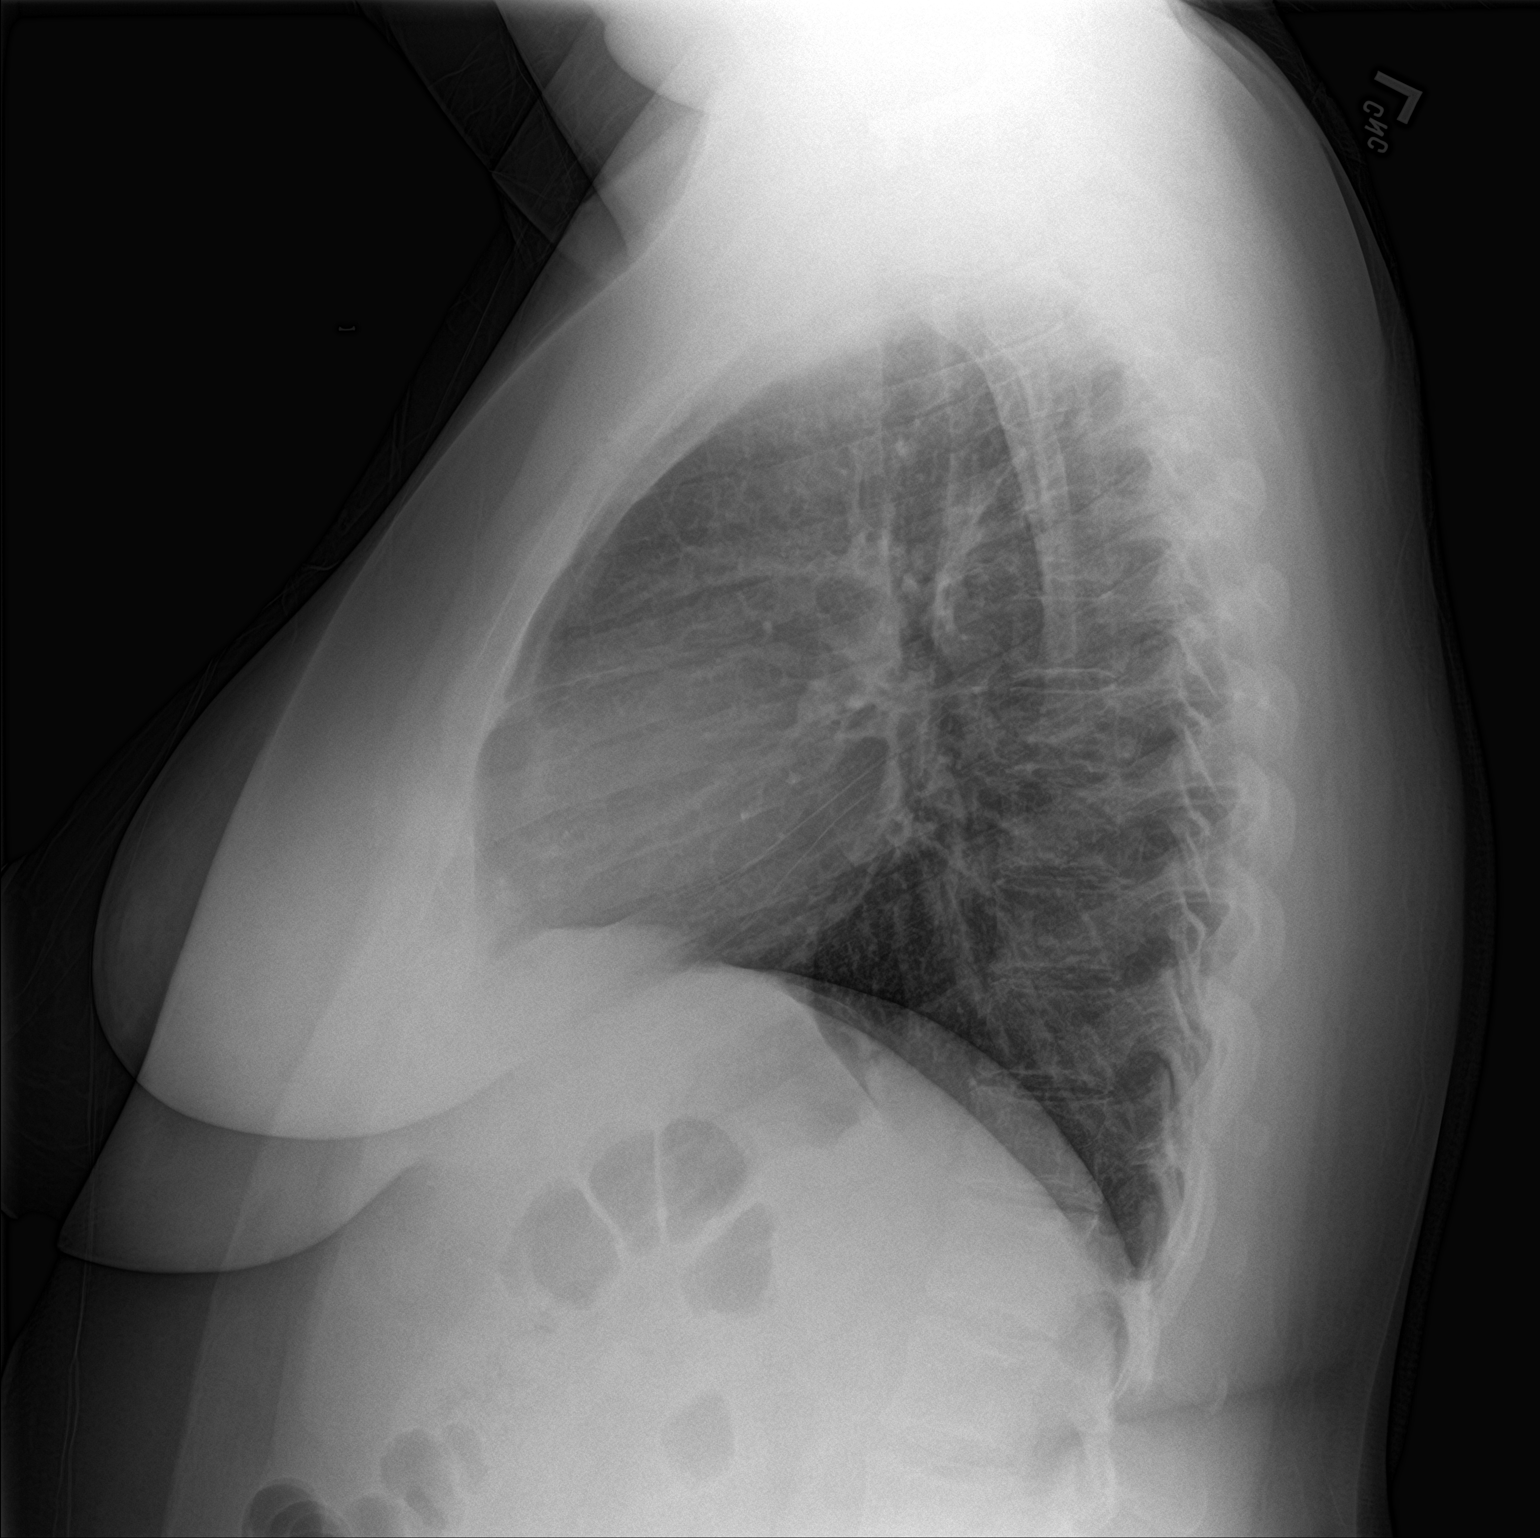

[2 of 2 positions shown; findings below may reference images not displayed]

FINDINGS: The lungs are clear. The heart size and pulmonary vascularity are
normal. No adenopathy. There is postoperative change in the lateral
right clavicle. No pneumothorax.
IMPRESSION: Lungs clear.  No adenopathy.

## 2021-03-17 IMAGING — CT CT ANGIO CHEST
2 of 6 series · 19 of 46 positions shown · IV contrast (APPLIED)
Comparison: None.

CLINICAL DATA: Short of breath. Concern for pulmonary embolism

EXAM:
CT ANGIOGRAPHY CHEST WITH CONTRAST
TECHNIQUE: Multidetector CT imaging of the chest was performed using the
standard protocol during bolus administration of intravenous
contrast. Multiplanar CT image reconstructions and MIPs were
obtained to evaluate the vascular anatomy.
CONTRAST:  75mL OMNIPAQUE IOHEXOL 350 MG/ML SOLN

[Series 6: thins · axial · 0.70mm/px · z∈[-372,-132]mm · 17 of 265 slices shown]
[im 12/265  lung]
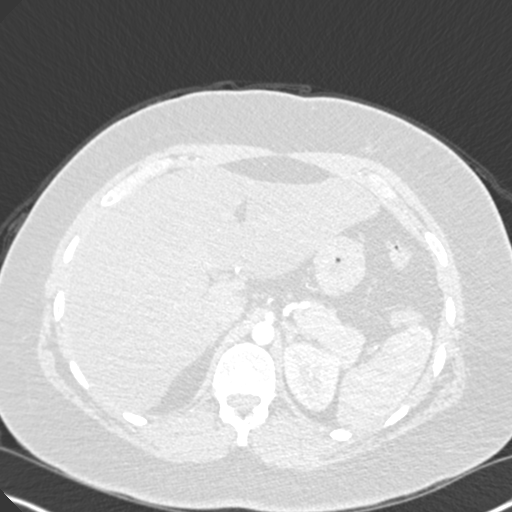
[im 23/265  soft-tissue]
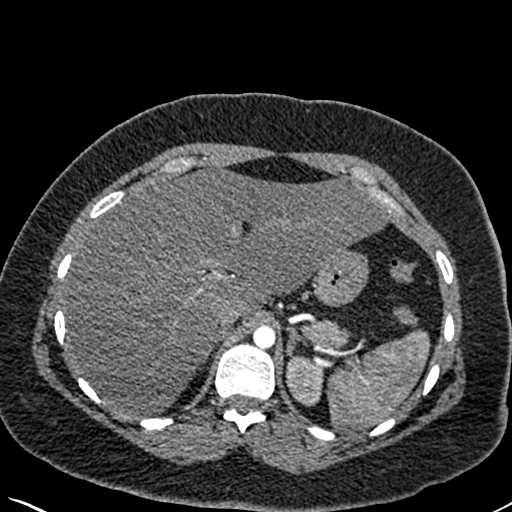
[im 46/265  lung]
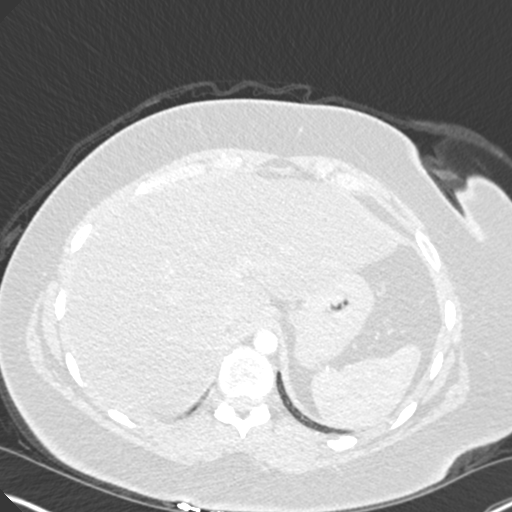
[im 58/265  soft-tissue]
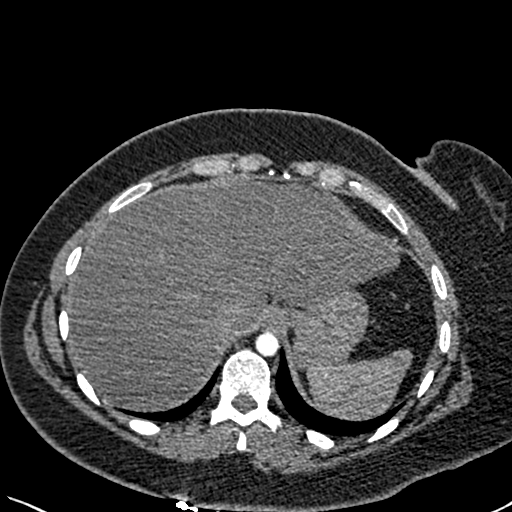
[im 69/265  lung]
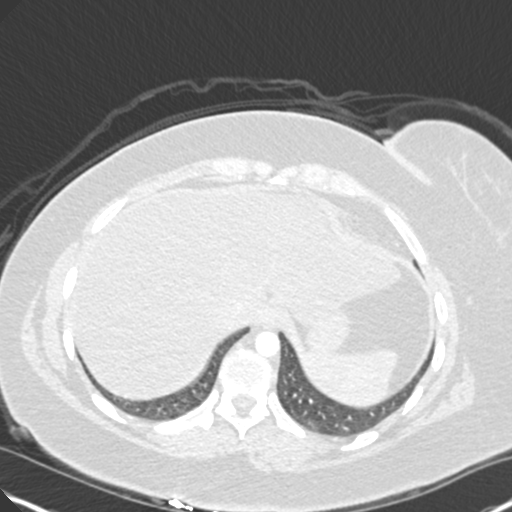
[im 92/265  soft-tissue]
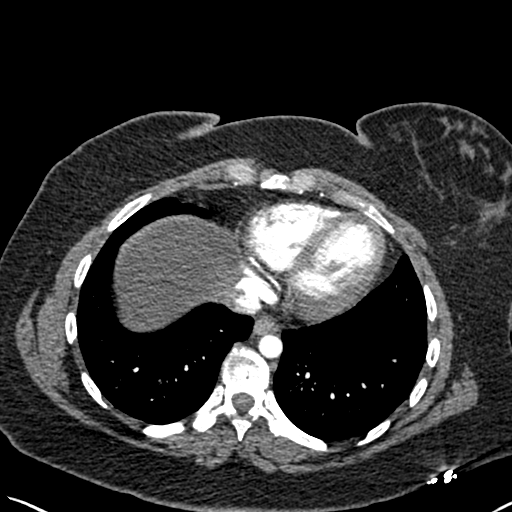
[im 104/265  lung]
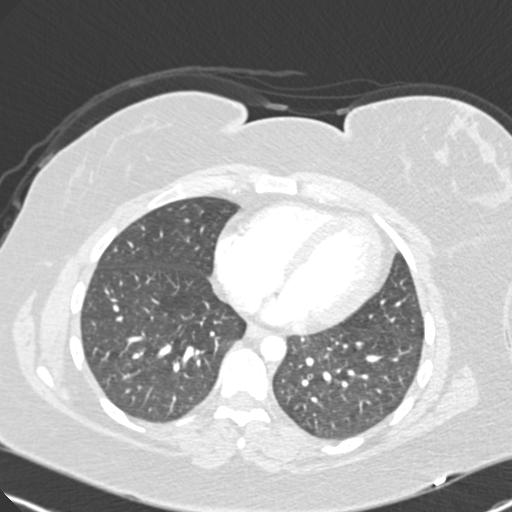
[im 115/265  soft-tissue]
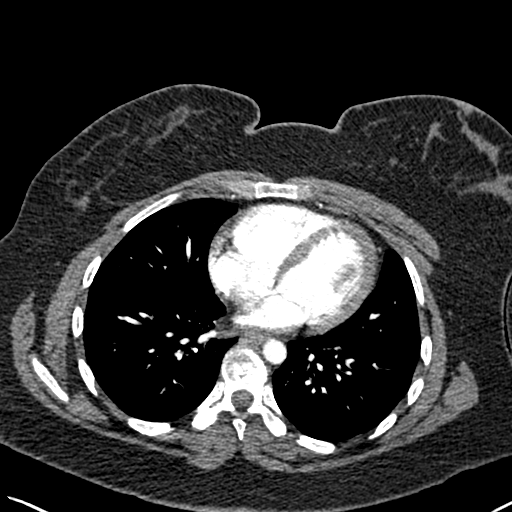
[im 138/265  lung]
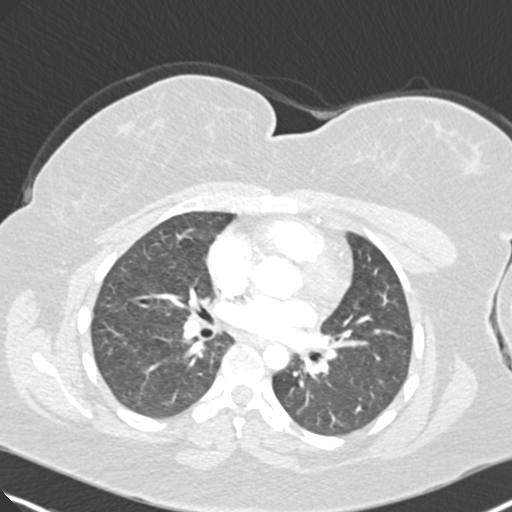
[im 150/265  soft-tissue]
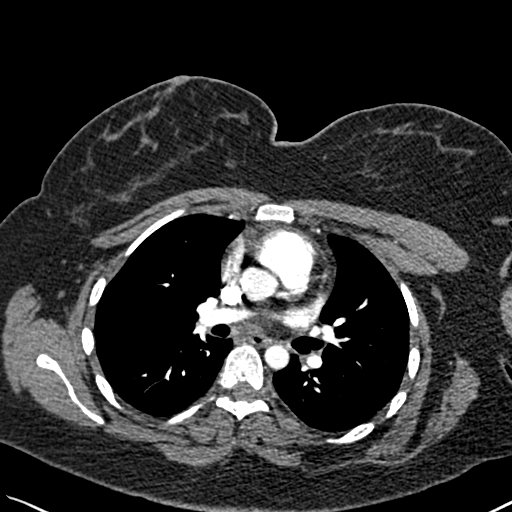
[im 161/265  lung]
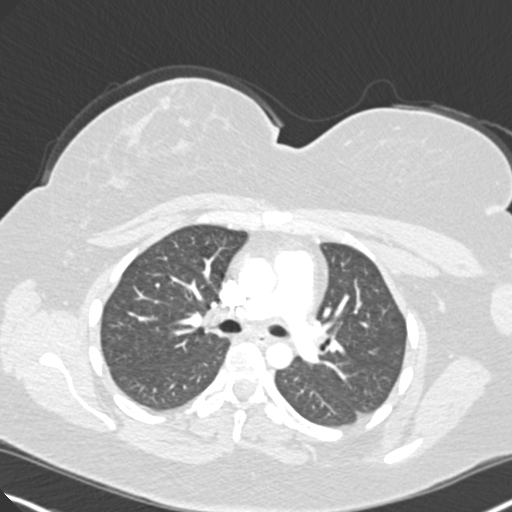
[im 173/265  soft-tissue]
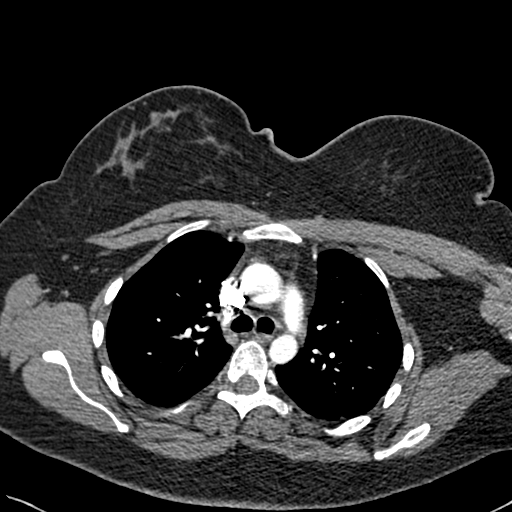
[im 196/265  lung]
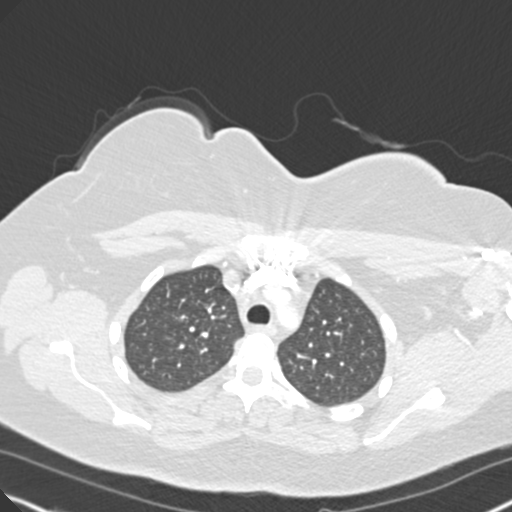
[im 207/265  soft-tissue]
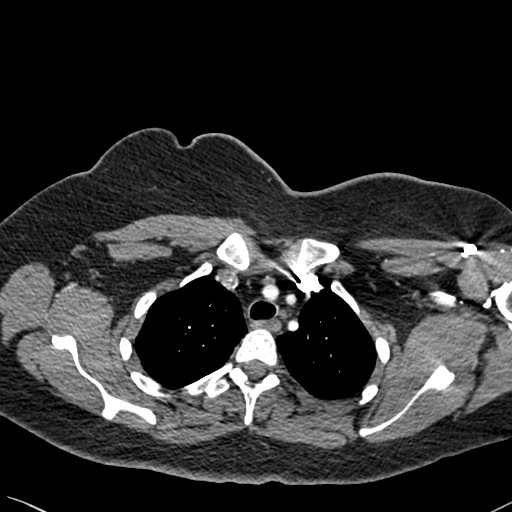
[im 219/265  lung]
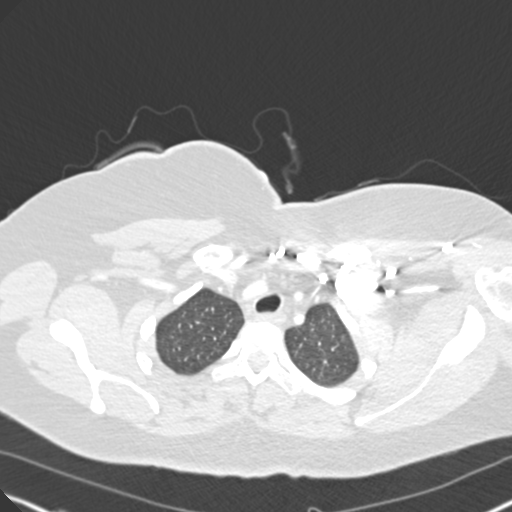
[im 242/265  soft-tissue]
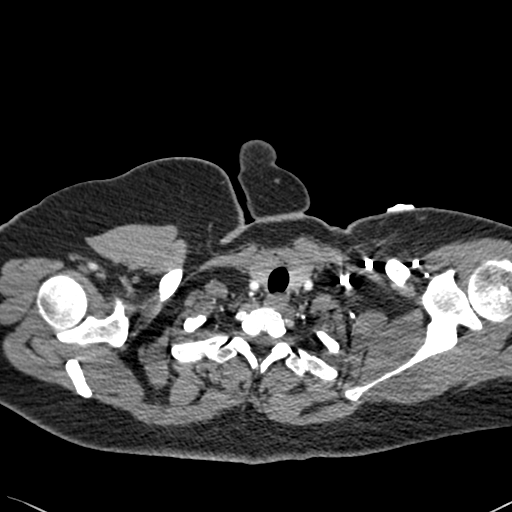
[im 253/265  lung]
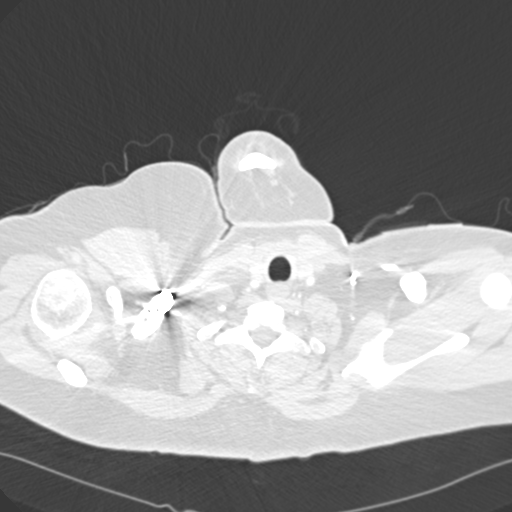

[Series 8: coronal mpr · coronal · 0.52mm/px · 2 of 83 slices shown]
[im 28/83  soft-tissue]
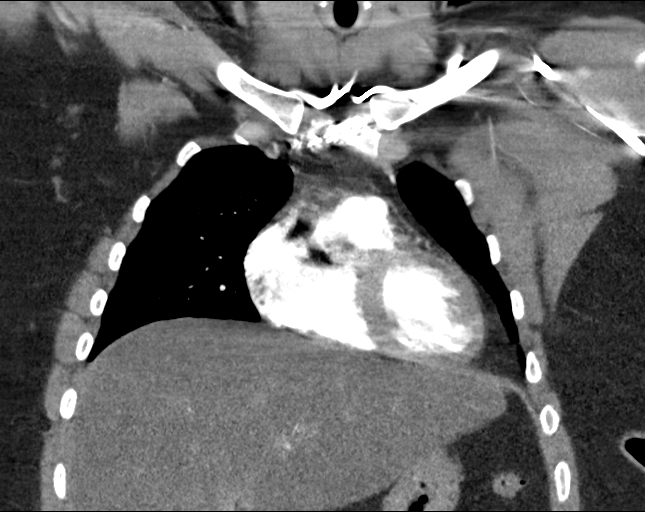
[im 55/83  soft-tissue]
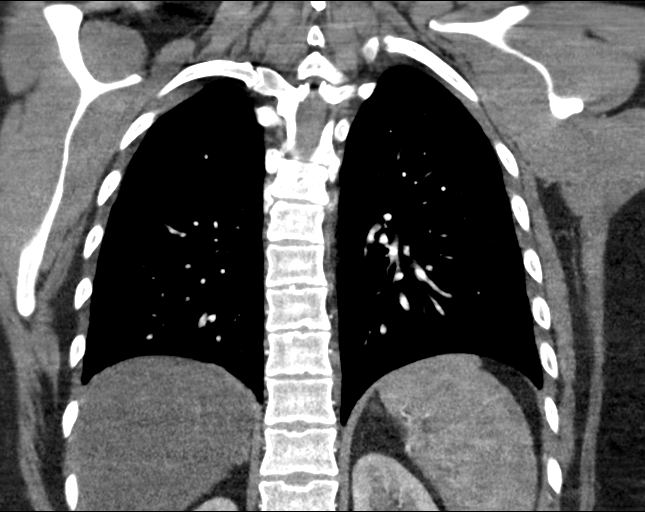

[19 of 46 positions shown; findings below may reference images not displayed]

FINDINGS: Cardiovascular: No filling defects within the pulmonary arteries
arteries to suggest acute pulmonary embolism.

Mediastinum/Nodes: No axillary supraclavicular adenopathy. No
mediastinal hilar adenopathy. No pericardial effusion. Esophagus
normal.

Lungs/Pleura: No pulmonary infarction. No pneumonia. No pleural
fluid. Airways normal

Upper Abdomen: Low-attenuation within liver. No upper abdominal
adenopathy.

Musculoskeletal: No aggressive osseous lesion

Review of the MIP images confirms the above findings.
IMPRESSION: 1. No evidence acute pulmonary embolism.
2. No acute pulmonary parenchymal findings.
3. Hepatic steatosis.

## 2021-03-27 ENCOUNTER — Ambulatory Visit: Payer: Managed Care, Other (non HMO) | Admitting: Dermatology

## 2021-03-27 ENCOUNTER — Other Ambulatory Visit: Payer: Self-pay

## 2021-03-27 DIAGNOSIS — R21 Rash and other nonspecific skin eruption: Secondary | ICD-10-CM

## 2021-03-27 DIAGNOSIS — L920 Granuloma annulare: Secondary | ICD-10-CM

## 2021-03-27 MED ORDER — ZORYVE 0.3 % EX CREA: 1.0000 "application " | TOPICAL_CREAM | Freq: Every day | CUTANEOUS | 1 refills | Status: DC

## 2021-03-27 MED ORDER — TACROLIMUS 0.1 % EX OINT: TOPICAL_OINTMENT | Freq: Every day | CUTANEOUS | 1 refills | Status: DC

## 2021-03-27 NOTE — Progress Notes (Signed)
? ?Follow-Up Visit ?  ?Subjective  ?Carrie Burke is a 31 y.o. female who presents for the following: Rash (Patient here today for 3 week rash follow up at upper legs c/w granuloma annulare and lower legs c/w allergic contact dermatitis vs eczema vs possible stasis changes vs psoriasis. Patient currently using clobetasol ointment once daily, patient was using twice daily. Patient advises rash has improved but is starting to itch again. ). ? ? ?The following portions of the chart were reviewed this encounter and updated as appropriate:  ?  ?  ? ?Review of Systems:  No other skin or systemic complaints except as noted in HPI or Assessment and Plan. ? ?Objective  ?Well appearing patient in no apparent distress; mood and affect are within normal limits. ? ?A focused examination was performed including legs, feet. Relevant physical exam findings are noted in the Assessment and Plan. ? ?B/L pretibia ?Pink flat patches, minimal scale ? ? ? ? ? ? ?B/L leg ?Light pink slightly elevated plaques at medial knees and calf, some with annular configuration.  ? ? ? ? ? ? ?Assessment & Plan  ?Rash ?B/L pretibia ? ?Dermatitis vrs psoriasis, much improved ? ?Patient does c/o ankle pain, she is seeing neurologist and is scheduled for some further testing. Advised patient she could consider seeing rheumatologist to r/o psoriatic arthritis.  ? ?Discontinue clobetasol 0.05% ointment, may restart 1-2 times daily for up to 2 weeks as needed for flares. Avoid applying to face, groin, and axilla. Use as directed. Long-term use can cause thinning of the skin. ? ?Discussed starting Zoryve cream, but pt is concerned about cost and wants to try something that is more likely to be covered by insurance. ?Start tacrolimus 0.1% ointment once daily to affected areas at legs.  ? ?Topical steroids (such as triamcinolone, fluocinolone, fluocinonide, mometasone, clobetasol, halobetasol, betamethasone, hydrocortisone) can cause thinning and  lightening of the skin if they are used for too long in the same area. Your physician has selected the right strength medicine for your problem and area affected on the body. Please use your medication only as directed by your physician to prevent side effects.  ? ?Recommend mild soap and moisturizing cream 1-2 times daily.   ? ? ?tacrolimus (PROTOPIC) 0.1 % ointment - B/L pretibia ?Apply topically daily. ? ?Related Medications ?clobetasol ointment (TEMOVATE) 0.05 % ?Apply to rash BID up to two weeks. Avoid applying to face, groin, and axilla. Use as directed. Long-term use can cause thinning of the skin. ? ?Granuloma annulare ?B/L leg ? ?Chronic condition. Currently asymptomatic and not bothersome to patient ? ?Granuloma annulare is a chronic benign skin condition characterized by pink smooth bumps or ring-like plaques most commonly appearing over the joints and the backs of the hands/feet. Its cause is not known, and most episodes of granuloma annulare clear up after a few years, with or without treatment.  ? ?Discontinue clobetasol 0.05% ointment, may restart 1-2 times daily for up to 2 weeks as needed for flares. Avoid applying to face, groin, and axilla. Use as directed. Long-term use can cause thinning of the skin. ? ?Start tacrolimus 0.1% ointment once daily to affected areas at legs.  ? ?Topical steroids (such as triamcinolone, fluocinolone, fluocinonide, mometasone, clobetasol, halobetasol, betamethasone, hydrocortisone) can cause thinning and lightening of the skin if they are used for too long in the same area. Your physician has selected the right strength medicine for your problem and area affected on the body. Please use your medication only  as directed by your physician to prevent side effects.  ? ? ? ?Return in about 6 months (around 09/27/2021) for Rash, with Dr. Laurence Ferrari. ? ?Graciella Belton, RMA, am acting as scribe for Brendolyn Patty, MD . ? ?Documentation: I have reviewed the above documentation for  accuracy and completeness, and I agree with the above. ? ?Brendolyn Patty MD  ? ?

## 2021-03-27 NOTE — Patient Instructions (Addendum)
Discontinue clobetasol 0.05% ointment, may restart 1-2 times daily for up to 2 weeks as needed for flares. Avoid applying to face, groin, and axilla. Use as directed. Long-term use can cause thinning of the skin. ? ?Start tacrolimus 0.1% ointment once daily to affected areas at legs.  ? ?Topical steroids (such as triamcinolone, fluocinolone, fluocinonide, mometasone, clobetasol, halobetasol, betamethasone, hydrocortisone) can cause thinning and lightening of the skin if they are used for too long in the same area. Your physician has selected the right strength medicine for your problem and area affected on the body. Please use your medication only as directed by your physician to prevent side effects.  ? ?If You Need Anything After Your Visit ? ?If you have any questions or concerns for your doctor, please call our main line at [phone] and press option 4 to reach your doctor's medical assistant. If no one answers, please leave a voicemail as directed and we will return your call as soon as possible. Messages left after 4 pm will be answered the following business day.  ? ?You may also send [place] a message via MyChart. We typically respond to MyChart messages within 1-2 business days. ? ?For prescription refills, please ask your pharmacy to contact our office. Our fax number is [phone]. ? ?If you have an urgent issue when the clinic is closed that cannot wait until the next business day, you can page your doctor at the number below.   ? ?Please note that while we do our best to be available for urgent issues outside of office hours, we are not available 24/7.  ? ?If you have an urgent issue and are unable to reach [place], you may choose to seek medical care at your doctor's office, retail clinic, urgent care center, or emergency room. ? ?If you have a medical emergency, please immediately call 911 or go to the emergency department. ? ?Pager Numbers ? ?- Dr. Nehemiah Massed: [phone] ? ?- Dr. Laurence Ferrari: [phone] ? ?-  Dr. Nicole Kindred: [phone] ? ?In the event of inclement weather, please call our main line at [phone] for an update on the status of any delays or closures. ? ?Dermatology Medication Tips: ?Please keep the boxes that topical medications come in in order to help keep track of the instructions about where and how to use these. Pharmacies typically print the medication instructions only on the boxes and not directly on the medication tubes.  ? ?If your medication is too expensive, please contact our office at [phone] option 4 or send [place] a message through [place].  ? ?We are unable to tell what your co-pay for medications will be in advance as this is different depending on your insurance coverage. However, we may be able to find a substitute medication at lower cost or fill out paperwork to get insurance to cover a needed medication.  ? ?If a prior authorization is required to get your medication covered by your insurance company, please allow [place] 1-2 business days to complete this process. ? ?Drug prices often vary depending on where the prescription is filled and some pharmacies may offer cheaper prices. ? ?The website www.goodrx.com contains coupons for medications through different pharmacies. The prices here do not account for what the cost may be with help from insurance (it may be cheaper with your insurance), but the website can give you the price if you did not use any insurance.  ?- You can print the associated coupon and take it with your prescription to the pharmacy.  ?-  You may also stop by our office during regular business hours and pick up a GoodRx coupon card.  ?- If you need your prescription sent electronically to a different pharmacy, notify our office through [facility] or by phone at [phone] option 4. ? ? ? ? ?Si Usted Necesita Algo Despu?s de Su Visita ? ?Tambi?n puede enviarnos un mensaje a trav?s de MyChart. Por lo general respondemos a los mensajes de MyChart en el  transcurso de 1 a 2 d?as h?biles. ? ?Para renovar recetas, por favor pida a su farmacia que se ponga en contacto con nuestra oficina. Nuestro n?mero de fax es el [phone]. ? ?Si tiene un asunto urgente cuando la cl?nica est? cerrada y que no puede esperar hasta el siguiente d?a h?bil, puede llamar/localizar a su doctor(a) al n?mero que aparece a continuaci?n.  ? ?Por favor, tenga en cuenta que aunque hacemos todo lo posible para estar disponibles para asuntos urgentes fuera del horario de oficina, no estamos disponibles las 24 horas del d?a, los 7 d?as de la semana.  ? ?Si tiene un problema urgente y no puede comunicarse con nosotros, puede optar por buscar atenci?n m?dica  en el consultorio de su doctor(a), en una cl?nica privada, en un centro de atenci?n urgente o en una sala de emergencias. ? ?Si tiene Engineer, maintenance (IT) m?dica, por favor llame inmediatamente al 911 o vaya a la sala de emergencias. ? ?N?meros de b?per ? ?- Dr. Nehemiah Massed: [phone] ? ?- Dra. Moye: [phone] ? ?- Dra. Nicole Kindred: [phone] ? ?En caso de inclemencias del tiempo, por favor llame a nuestra l?nea principal al [phone] para una actualizaci?n sobre el estado de cualquier retraso o cierre. ? ?Consejos para la medicaci?n en dermatolog?a: ?Por favor, guarde las cajas en las que vienen los medicamentos de uso t?pico para ayudarle a seguir las instrucciones sobre d?nde y c?mo usarlos. Las farmacias generalmente imprimen las instrucciones del medicamento s?lo en las cajas y no directamente en los tubos del [place].  ? ?Si su medicamento es muy caro, por favor, p?ngase en contacto con Zigmund Daniel llamando al [phone] y presione la opci?n 4 o env?enos un mensaje a trav?s de MyChart.  ? ?No podemos decirle cu?l ser? su copago por los medicamentos por adelantado ya que esto es diferente dependiendo de la cobertura de su seguro. Sin embargo, es posible que podamos encontrar un medicamento sustituto a Electrical engineer un  formulario para que el seguro cubra el medicamento que se considera necesario.  ? ?Si se requiere Ardelia Mems autorizaci?n previa para que su compa??a de seguros [place] su medicamento, por favor perm?tanos de 1 a 2 d?as h?biles para completar este proceso. ? ?Los precios de los medicamentos var?an con frecuencia dependiendo del Environmental consultant de d?nde se surte la receta y alguna farmacias pueden ofrecer precios m?s baratos. ? ?El sitio web www.goodrx.com tiene cupones para medicamentos de Airline pilot. Los precios aqu? no tienen en cuenta lo que podr?a costar con la ayuda del seguro (puede ser m?s barato con su seguro), pero el sitio web puede darle el precio si no utiliz? ning?n seguro.  ?- Puede imprimir el cup?n correspondiente y llevarlo con su receta a la farmacia.  ?- Tambi?n puede pasar por nuestra oficina durante el horario de atenci?n regular y recoger una tarjeta de cupones de GoodRx.  ?- Si necesita que su receta se env?e electr?nicamente a Chiropodist, informe a nuestra oficina a trav?s de MyChart de [place] o por tel?fono llamando al [phone] y  presione la opci?n 4. ? ?

## 2021-03-30 ENCOUNTER — Other Ambulatory Visit: Payer: Self-pay | Admitting: Physician Assistant

## 2021-03-30 DIAGNOSIS — G379 Demyelinating disease of central nervous system, unspecified: Secondary | ICD-10-CM

## 2021-04-08 ENCOUNTER — Other Ambulatory Visit: Payer: Self-pay

## 2021-04-08 ENCOUNTER — Ambulatory Visit
Admission: RE | Admit: 2021-04-08 | Discharge: 2021-04-08 | Disposition: A | Discharge status: Home / Self Care | Payer: Managed Care, Other (non HMO) | Source: Ambulatory Visit | Attending: Physician Assistant | Admitting: Physician Assistant

## 2021-04-08 DIAGNOSIS — G379 Demyelinating disease of central nervous system, unspecified: Secondary | ICD-10-CM | POA: Diagnosis present

## 2021-04-08 MED ORDER — GADOBUTROL 1 MMOL/ML IV SOLN
9.0000 mL | Freq: Once | INTRAVENOUS | Status: AC | PRN
  Administered 2021-04-08: 10 mL via INTRAVENOUS

## 2021-05-25 ENCOUNTER — Other Ambulatory Visit: Payer: Self-pay | Admitting: Physician Assistant

## 2021-05-25 DIAGNOSIS — G8929 Other chronic pain: Secondary | ICD-10-CM

## 2021-07-29 ENCOUNTER — Ambulatory Visit
Admission: RE | Admit: 2021-07-29 | Discharge: 2021-07-29 | Disposition: A | Discharge status: Home / Self Care | Payer: Managed Care, Other (non HMO) | Source: Ambulatory Visit | Attending: Physician Assistant | Admitting: Physician Assistant

## 2021-07-29 DIAGNOSIS — M5442 Lumbago with sciatica, left side: Secondary | ICD-10-CM | POA: Insufficient documentation

## 2021-07-29 DIAGNOSIS — G8929 Other chronic pain: Secondary | ICD-10-CM | POA: Diagnosis present

## 2021-10-03 ENCOUNTER — Ambulatory Visit: Payer: Managed Care, Other (non HMO) | Admitting: Dermatology

## 2021-11-26 NOTE — Progress Notes (Unsigned)
Referring Physician:  Harvest Dark, [address] [place],  [place] [address]  Primary Physician:  Baxter Hire, MD  History of Present Illness: 11/26/2021 [name] is here today with a chief complaint of back pain as well as leg pain.  She gets pain all the way down her legs on her anterior thigh, shin, and top of her toes.  She also gets pain in her calves.  She has legs that shake uncontrollably at times.  She sometimes cannot feel her legs when she walks.  This has been ongoing for approximately 1 year and is getting gradually worse.  She reports pain as bad as 7 out of 10 with aching and throbbing.  Activity and standing make it worse.  Nothing has really helped.   Bowel/Bladder Dysfunction: none  Conservative measures:  Physical therapy: has participated in a few visits at [facility] in [place] - has not been discharged Multimodal medical therapy including regular antiinflammatories: ibuprofen, tylenol, lyrica Injections: has received epidural steroid injections 10/16/2021: Left hip joint injection (increase in pain) 09/14/2021: Bilateral L5-S1 transforaminal ESI (good relief of right leg pain continued pain on left leg)   Past Surgery: denies  Carrie Burke has no symptoms of cervical myelopathy.  The symptoms are causing a significant impact on the patient's life.   Review of Systems:  A 10 point review of systems is negative, except for the pertinent positives and negatives detailed in the HPI.  Past Medical History: Past Medical History:  Diagnosis Date   Anxiety    Bipolar disorder ([place])    Common migraine with intractable migraine 03/18/2017   Depression    Elevated serum glutamic pyruvic transaminase (SGPT) level    Gait abnormality 03/18/2017   Gestational diabetes    High cholesterol    High-risk pregnancy 01/24/2014   History of pre-eclampsia    History of pre-eclampsia in prior pregnancy, currently pregnant in third  trimester 01/24/2014   Polyhydramnios in third trimester 02/11/2014   Preeclampsia    Vitamin D deficiency disease     Past Surgical History: Past Surgical History:  Procedure Laterality Date   CLAVICLE SURGERY  2014   TUBAL LIGATION      Allergies: Allergies as of 11/27/2021   (No Known Allergies)    Medications: No outpatient medications have been marked as taking for the 11/27/21 encounter (Appointment) with Meade Maw, MD.    Social History: Social History   Tobacco Use   Smoking status: Every Day    Packs/day: 1.00    Types: Cigarettes   Smokeless tobacco: Never  Vaping Use   Vaping Use: Never used  Substance Use Topics   Alcohol use: Yes    Comment: Occasionally   Drug use: No    Family Medical History: Family History  Problem Relation Age of Onset   Arthritis Mother    Breast cancer Mother 27   Depression Mother    Anxiety disorder Mother    Heart disease Father    Hyperlipidemia Father    Hypertension Father    Asthma Brother    Breast cancer Maternal Grandmother    Breast cancer Paternal Grandmother    Bipolar disorder Paternal Uncle     Physical Examination: There were no vitals filed for this visit.  General: Patient is well developed, well nourished, calm, collected, and in no apparent distress. Attention to examination is appropriate.  Neck:   Supple.  Full range of motion.  Respiratory: Patient is breathing  without any difficulty.   NEUROLOGICAL:     Awake, alert, oriented to person, place, and time.  Speech is clear and fluent. Fund of knowledge is appropriate.   Cranial Nerves: Pupils equal round and reactive to light.  Facial tone is symmetric.  Facial sensation is symmetric. Shoulder shrug is symmetric. Tongue protrusion is midline.  There is no pronator drift.  ROM of spine: full.    Strength: Side Biceps Triceps Deltoid Interossei Grip Wrist Ext. Wrist Flex.  R '5 5 5 5 5 5 5  '$ L '5 5 5 5 5 5 5   '$ Side Iliopsoas Quads  Hamstring PF DF EHL  R '5 5 5 5 5 5  '$ L '5 5 5 5 5 5   '$ Reflexes are 2+ and symmetric at the biceps, triceps, brachioradialis, patella and achilles.   Hoffman's is absent.   Bilateral upper and lower extremity sensation is intact to light touch.    No evidence of dysmetria noted.    Medical Decision Making  Imaging: MRI L spine 07/29/2021  IMPRESSION: 1. Shallow central disc protrusion at L5-S1, closely approximating the descending S1 nerve roots without frank impingement or displacement. 2. Minimal disc bulge with facet hypertrophy at L4-5 with resultant mild bilateral subarticular stenosis, left slightly worse than right. 3. Mild lower lumbar facet hypertrophy, most pronounced at L4-5.     Electronically Signed   By: Jeannine Boga M.D.   On: 07/30/2021 00:30  I have personally reviewed the images and agree with the above interpretation.  Assessment and Plan: [name] is a pleasant 31 y.o. female with low back pain without clear radicular component.  She has significant degenerative changes in her lower spine, but no obvious target for surgical intervention.  I would not recommend surgery for her.  I have recommended that she consider reevaluation in the physiatry office for radiofrequency ablation or facet injections.  I will see her back on an as-needed basis.   I spent a total of 30 minutes in face-to-face and non-face-to-face activities related to this patient's care today.  Thank you for involving me in the care of this patient.      Meital Riehl K. Izora Ribas MD, [facility] Neurosurgery

## 2021-11-27 ENCOUNTER — Encounter: Payer: Self-pay | Admitting: Neurosurgery

## 2021-11-27 ENCOUNTER — Ambulatory Visit: Payer: Managed Care, Other (non HMO) | Admitting: Neurosurgery

## 2021-11-27 VITALS — BP 151/86 | HR 74 | Ht 67.0 in | Wt 201.4 lb

## 2021-11-27 DIAGNOSIS — M545 Low back pain, unspecified: Secondary | ICD-10-CM

## 2021-11-27 DIAGNOSIS — G8929 Other chronic pain: Secondary | ICD-10-CM

## 2023-11-17 ENCOUNTER — Ambulatory Visit: Payer: Self-pay
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
## 2023-11-19 ENCOUNTER — Ambulatory Visit: Payer: Self-pay | Admitting: Student

## 2023-11-19 ENCOUNTER — Encounter: Payer: Self-pay | Admitting: Emergency Medicine

## 2023-11-19 ENCOUNTER — Ambulatory Visit
Admission: EM | Admit: 2023-11-19 | Discharge: 2023-11-19 | Disposition: A | Discharge status: Home / Self Care | Payer: PRIVATE HEALTH INSURANCE | Attending: Family Medicine | Admitting: Family Medicine

## 2023-11-19 DIAGNOSIS — M5417 Radiculopathy, lumbosacral region: Secondary | ICD-10-CM | POA: Diagnosis not present

## 2023-11-19 DIAGNOSIS — G8929 Other chronic pain: Secondary | ICD-10-CM | POA: Diagnosis not present

## 2023-11-19 DIAGNOSIS — M542 Cervicalgia: Secondary | ICD-10-CM

## 2023-11-19 DIAGNOSIS — M549 Dorsalgia, unspecified: Secondary | ICD-10-CM

## 2023-11-19 MED ORDER — CYCLOBENZAPRINE HCL 5 MG PO TABS: 5.0000 mg | ORAL_TABLET | Freq: Three times a day (TID) | ORAL | 0 refills | Status: AC | PRN

## 2023-11-19 MED ORDER — PREDNISONE 10 MG (21) PO TBPK: ORAL_TABLET | Freq: Every day | ORAL | 0 refills | Status: AC
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
## 2023-11-19 NOTE — Discharge Instructions (Addendum)
 Follow up with [facility] or [facility] for further evaluation. See handout on lumbosacral radiculopathy.  If medication was prescribed, stop by the pharmacy to pick up your prescriptions.  For your back pain, Take 1500 mg Tylenol  twice a day, take muscle relaxer as needed for pain.  Consider stopping by the pharmacy or dollar store to pick up some Lidocaine  patches. Apply for 12 hours and then remove.   Watch for worsening symptoms such as an increasing weakness or loss of sensation in your arms or legs, increasing pain and/or the loss of bladder or bowel function. Should any of these occur, go to the emergency department immediately.
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
